# Patient Record
Sex: Male | Born: 1935 | Race: White | Hispanic: No | Marital: Married | Smoking: Former smoker
Health system: Southern US, Community
[De-identification: ages and names within clinical notes are randomized; demographics above are authoritative.]

## PROBLEM LIST (undated history)

## (undated) DIAGNOSIS — S82892A Other fracture of left lower leg, initial encounter for closed fracture: Secondary | ICD-10-CM

## (undated) DIAGNOSIS — Z87891 Personal history of nicotine dependence: Secondary | ICD-10-CM

## (undated) DIAGNOSIS — IMO0001 Reserved for inherently not codable concepts without codable children: Secondary | ICD-10-CM

## (undated) DIAGNOSIS — M1990 Unspecified osteoarthritis, unspecified site: Secondary | ICD-10-CM

## (undated) DIAGNOSIS — I480 Paroxysmal atrial fibrillation: Secondary | ICD-10-CM

## (undated) DIAGNOSIS — E785 Hyperlipidemia, unspecified: Secondary | ICD-10-CM

## (undated) DIAGNOSIS — I10 Essential (primary) hypertension: Secondary | ICD-10-CM

## (undated) DIAGNOSIS — E119 Type 2 diabetes mellitus without complications: Secondary | ICD-10-CM

## (undated) DIAGNOSIS — M316 Other giant cell arteritis: Secondary | ICD-10-CM

## (undated) DIAGNOSIS — E041 Nontoxic single thyroid nodule: Secondary | ICD-10-CM

## (undated) DIAGNOSIS — R300 Dysuria: Secondary | ICD-10-CM

## (undated) DIAGNOSIS — H547 Unspecified visual loss: Secondary | ICD-10-CM

## (undated) DIAGNOSIS — H349 Unspecified retinal vascular occlusion: Secondary | ICD-10-CM

## (undated) HISTORY — DX: Unspecified osteoarthritis, unspecified site: M19.90

## (undated) HISTORY — DX: Essential (primary) hypertension: I10

## (undated) HISTORY — DX: Other fracture of left lower leg, initial encounter for closed fracture: S82.892A

## (undated) HISTORY — DX: Type 2 diabetes mellitus without complications: E11.9

## (undated) HISTORY — DX: Unspecified retinal vascular occlusion: H34.9

## (undated) HISTORY — DX: Reserved for inherently not codable concepts without codable children: IMO0001

## (undated) HISTORY — DX: Paroxysmal atrial fibrillation: I48.0

## (undated) HISTORY — PX: EYE SURGERY: SHX253

## (undated) HISTORY — DX: Hyperlipidemia, unspecified: E78.5

## (undated) HISTORY — DX: Personal history of nicotine dependence: Z87.891

## (undated) HISTORY — DX: Nontoxic single thyroid nodule: E04.1

## (undated) HISTORY — PX: LASIK: SHX215

## (undated) HISTORY — DX: Dysuria: R30.0

## (undated) HISTORY — DX: Unspecified visual loss: H54.7

---

## 1994-10-05 HISTORY — PX: OTHER SURGICAL HISTORY: SHX169

## 2007-06-29 ENCOUNTER — Encounter: Payer: Self-pay | Admitting: Internal Medicine

## 2007-07-06 ENCOUNTER — Encounter: Payer: Self-pay | Admitting: Internal Medicine

## 2007-08-22 ENCOUNTER — Ambulatory Visit: Payer: Self-pay | Admitting: Internal Medicine

## 2007-09-12 ENCOUNTER — Encounter: Payer: Self-pay | Admitting: Internal Medicine

## 2007-10-06 ENCOUNTER — Encounter: Payer: Self-pay | Admitting: Internal Medicine

## 2008-09-03 ENCOUNTER — Ambulatory Visit: Payer: Self-pay | Admitting: Internal Medicine

## 2009-11-28 ENCOUNTER — Encounter: Payer: Self-pay | Admitting: Cardiovascular Disease

## 2010-08-15 ENCOUNTER — Encounter: Payer: Self-pay | Admitting: Cardiovascular Disease

## 2010-08-18 ENCOUNTER — Encounter: Payer: Self-pay | Admitting: Cardiovascular Disease

## 2010-08-21 ENCOUNTER — Encounter: Payer: Self-pay | Admitting: Cardiovascular Disease

## 2010-10-21 ENCOUNTER — Encounter: Payer: Self-pay | Admitting: Cardiology

## 2010-10-21 ENCOUNTER — Encounter: Payer: Self-pay | Admitting: Cardiovascular Disease

## 2010-11-04 ENCOUNTER — Encounter: Payer: Self-pay | Admitting: Cardiovascular Disease

## 2010-11-04 ENCOUNTER — Ambulatory Visit
Admission: RE | Admit: 2010-11-04 | Discharge: 2010-11-04 | Payer: Self-pay | Source: Home / Self Care | Attending: Cardiovascular Disease | Admitting: Cardiovascular Disease

## 2010-11-04 DIAGNOSIS — I4891 Unspecified atrial fibrillation: Secondary | ICD-10-CM | POA: Insufficient documentation

## 2010-11-04 DIAGNOSIS — E785 Hyperlipidemia, unspecified: Secondary | ICD-10-CM | POA: Insufficient documentation

## 2010-11-04 DIAGNOSIS — I10 Essential (primary) hypertension: Secondary | ICD-10-CM | POA: Insufficient documentation

## 2010-11-06 ENCOUNTER — Encounter: Payer: Self-pay | Admitting: Cardiovascular Disease

## 2010-11-12 ENCOUNTER — Encounter: Payer: Self-pay | Admitting: Cardiovascular Disease

## 2010-11-12 NOTE — Assessment & Plan Note (Signed)
Summary: NP6/SAB   Visit Type:  Initial Consult Primary Provider:  Dr. Darrick Huntsman  CC:  c/o SOB when walking up hills and occasional dizzy spells. denies chest pain.Marland Kitchen  History of Present Illness: 77 year old gentleman, patient of Dr. Darrick Huntsman, with a history of diabetes, hypertension, hyperlipidemia, Long smoking history who stopped 10 years ago, who presents by referral for evaluation of atrial fibrillation.  He reports having shortness of breath, poor energy and some dizzy episodes dating back to August of 2011. He was seen in [place] while visiting his daughter and notes indicate he was in atrial fibrillation at that time. He was started on aspirin. Echocardiogram suggested borderline normal ejection fraction of 50-55%, normal left atrial size, normal right ventricular systolic pressures, mild TR, mild MR otherwise essentially normal study.  He has felt relatively well apart from continued mild shortness of breath, increased fatigue. He denies any TIA type symptoms. No chest pain with exertion apart from shortness of breath. No significant lower extremity edema. The dizzy episodes are very brief and usually resolve once he sits down.  EKG shows atrial fibrillation with ventricular rate 87 beats per minute with no significant ST or T-wave changes noted, low voltage in the limb leads.  Notes indicate Holter monitor was performed for 24 hours August 21, 2010 joint average heart rate 71 beats per minute. Report suggests paroxysmal atrial fibrillation with primary rhythm being sinus rhythm with atrial flutter/fibrillation noted  Preventive Screening-Counseling & Management  Caffeine-Diet-Exercise     Does Patient Exercise: yes      Drug Use:  no.    Current Medications (verified): 1)  Flomax 0.4 Mg Caps (Tamsulosin Hcl) .Marland Kitchen.. 1 Tablet Once Daily 2)  Vitamin B-12 .Marland Kitchen.. 1 Tablet Daily 3)  Vitamin E .... 1 Tablet Daily 4)  Vitamin C .... 1 Tablet Daily 5)  Calcium .Marland Kitchen.. 1 Tablet Daily 6)  Fish Oil  .Marland Kitchen.. 1 Tablet Daily 7)  Saw Palmetto 500 Mg Caps (Saw Palmetto (Serenoa Repens)) .... Dose Unknown 8)  Novofine 30g X 8 Mm Misc (Insulin Pen Needle) .Marland Kitchen.. 1 Daily For 90 Days 9)  Lisinopril 10 Mg Tabs (Lisinopril) .Marland Kitchen.. 1 Tablet Two Times A Day 10)  Lipitor 20 Mg Tabs (Atorvastatin Calcium) .Marland Kitchen.. 1 Tablet Daily 11)  Terazosin Hcl 2 Mg Caps (Terazosin Hcl) .Marland Kitchen.. 1 Tablet Daily 12)  Metformin Hcl 1000 Mg Tabs (Metformin Hcl) .Marland Kitchen.. 1 Tablet Two Times A Day 13)  Hydrochlorothiazide 25 Mg Tabs (Hydrochlorothiazide) .Marland Kitchen.. 1 Tablet Daily 14)  Ecotrin 325 Mg Tbec (Aspirin) .Marland Kitchen.. 1 Tablet Once Daily  Allergies (verified): No Known Drug Allergies  Past History:  Past Surgical History: Last updated: 10/31/2010 Left ankle -1996,s/p surgery with screws  Family History: Last updated: 11/04/2010 Father: hypertension, CHF, cancer-deceased Mother: heart disease  Social History: Last updated: 11/04/2010 Retired  Married  Tobacco Use - Former-quit 4 years ago Alcohol Use - no Regular Exercise - yes-walks dog Drug Use - no  Risk Factors: Exercise: yes (11/04/2010)  Risk Factors: Smoking Status: quit (10/31/2010)  Past Medical History: Non-insulin dependent diabetes Mellitus Fracture of ankle-Left Atrial Fibrillation Hyperlipidemia  Family History: Father: hypertension, CHF, cancer-deceased Mother: heart disease  Social History: Retired  Married  Tobacco Use - Former-quit 4 years ago Alcohol Use - no Regular Exercise - yes-walks dog Drug Use - no Does Patient Exercise:  yes Drug Use:  no  Review of Systems       The patient complains of dyspnea on exertion.  The patient denies fever, weight  loss, weight gain, vision loss, decreased hearing, hoarseness, chest pain, syncope, peripheral edema, prolonged cough, abdominal pain, incontinence, muscle weakness, depression, and enlarged lymph nodes.         FATIGUE, sob  Vital Signs:  Patient profile:   77 year old male Height:      72  inches Weight:      156 pounds BMI:     21.23 Pulse rate:   87 / minute BP sitting:   145 / 76  (left arm) Cuff size:   regular  Vitals Entered By: Lysbeth Galas CMA (November 04, 2010 3:55 PM)   Physical Exam  General:  Well developed, well nourished, in no acute distress. Head:  normocephalic and atraumatic Neck:  Neck supple, no JVD. No masses, thyromegaly or abnormal cervical nodes. Lungs:  Clear bilaterally to auscultation and percussion. Heart:  Non-displaced PMI, chest non-tender; irregular rate and rhythm, S1, S2 without murmurs, rubs or gallops. Carotid upstroke normal, no bruit. Normal abdominal aortic size, no bruits. Femorals normal pulses, no bruits. Pedals normal pulses. No edema, no varicosities. Abdomen:  Bowel sounds positive; abdomen soft and non-tender without masses,  Msk:  Back normal, normal gait. Muscle strength and tone normal. Pulses:  pulses normal in all 4 extremities Extremities:  No clubbing or cyanosis. Neurologic:  Alert and oriented x 3. Skin:  Intact without lesions or rashes. Psych:  Normal affect.   Impression & Recommendations:  Problem # 1:  ATRIAL FIBRILLATION (ICD-427.31) appears to be in atrial fibrillation though uncertain if this is paroxysmal or chronic. We will start him on pradaxa and meet again with him in 4 weeks to discuss pharmacological cardioversion or DC cardioversion. His echocardiogram is essentially normal and my hope would be to maintain sinus rhythm.  For his rate control, I will start him on metoprolol per trach 25 mg b.i.d.. I suspect some of his dizzy episodes and may come from elevated heart rate. I've asked him to monitor his blood pressure and heart rate at home on the new medication.  The following medications were removed from the medication list:    Coumadin 5 Mg Tabs (Warfarin sodium) .Marland Kitchen... As directed    Ecotrin 325 Mg Tbec (Aspirin) .Marland Kitchen... 1 tablet once daily His updated medication list for this problem  includes:    Metoprolol Tartrate 25 Mg Tabs (Metoprolol tartrate) .Marland Kitchen... Take one tablet by mouth twice a day  Orders: EKG w/ Interpretation (93000)  Problem # 2:  HYPERLIPIDEMIA-MIXED (ICD-272.4) Cholesterol is well controlled on Lipitor. He does have a long smoking history.  His updated medication list for this problem includes:    Lipitor 20 Mg Tabs (Atorvastatin calcium) .Marland Kitchen... 1 tablet daily  Problem # 3:  HYPERTENSION, BENIGN (ICD-401.1) blood pressure is borderline elevated today. We will add metoprolol b.i.d. for rate control and blood pressure control.  The following medications were removed from the medication list:    Ecotrin 325 Mg Tbec (Aspirin) .Marland Kitchen... 1 tablet once daily His updated medication list for this problem includes:    Lisinopril 10 Mg Tabs (Lisinopril) .Marland Kitchen... 1 tablet two times a day    Terazosin Hcl 2 Mg Caps (Terazosin hcl) .Marland Kitchen... 1 tablet daily    Hydrochlorothiazide 25 Mg Tabs (Hydrochlorothiazide) .Marland Kitchen... 1 tablet daily    Metoprolol Tartrate 25 Mg Tabs (Metoprolol tartrate) .Marland Kitchen... Take one tablet by mouth twice a day  Patient Instructions: 1)  Your physician recommends that you schedule a follow-up appointment in: 4 weeks 2)  Your physician has recommended  you make the following change in your medication: STOP Aspirin. START Pradaxa 150mg  two times a day.  START Metoprolol Tartrate 25mg  two times a day. Prescriptions: PRADAXA 150 MG CAPS (DABIGATRAN ETEXILATE MESYLATE) Take one tablet two times a day  #60 x 6   Entered by:   Lanny Hurst RN   Authorized by:   Dossie Arbour MD   Signed by:   Lanny Hurst RN on 11/04/2010   Method used:   Electronically to        [facility]  [address] (retail)       [address]       [place], [place]  [address]       Ph: [phone]       Fax: [phone]   RxID:   [id number] METOPROLOL TARTRATE 25 MG TABS (METOPROLOL TARTRATE) Take one tablet by mouth twice a day  #60 x 6   Entered by:   Lanny Hurst  RN   Authorized by:   Dossie Arbour MD   Signed by:   Lanny Hurst RN on 11/04/2010   Method used:   Electronically to        [facility]  [address] (retail)       [address]       [place], [place]  [address]       Ph: [phone]       Fax: [phone]   RxID:   [id number]

## 2010-11-13 ENCOUNTER — Encounter: Payer: Self-pay | Admitting: Cardiovascular Disease

## 2010-11-20 NOTE — Medication Information (Signed)
Summary: Tax adviser   Imported By: Harlon Flor 11/14/2010 16:26:28  _____________________________________________________________________  External Attachment:    Type:   Image     Comment:   External Document

## 2010-12-02 NOTE — Letter (Signed)
Summary: [facility] 2010-2011  [facility] 2010-2011   Imported By: Marylou Mccoy 11/27/2010 13:26:28  _____________________________________________________________________  External Attachment:    Type:   Image     Comment:   External Document

## 2010-12-02 NOTE — Letter (Signed)
Summary: External Office Note (Unknown Facility & Doctor)  External Office Note (Unknown Facility & Doctor)   Imported By: Marylou Mccoy 11/27/2010 13:25:16  _____________________________________________________________________  External Attachment:    Type:   Image     Comment:   External Document

## 2010-12-02 NOTE — Consult Note (Signed)
Summary: [facility]: Consultation Report  [facility]: Consultation Report   Imported By: Earl Many 11/26/2010 10:19:36  _____________________________________________________________________  External Attachment:    Type:   Image     Comment:   External Document

## 2010-12-03 ENCOUNTER — Ambulatory Visit (INDEPENDENT_AMBULATORY_CARE_PROVIDER_SITE_OTHER): Payer: Medicare Other | Admitting: Cardiovascular Disease

## 2010-12-03 ENCOUNTER — Encounter: Payer: Self-pay | Admitting: Cardiovascular Disease

## 2010-12-03 DIAGNOSIS — I4891 Unspecified atrial fibrillation: Secondary | ICD-10-CM

## 2010-12-03 DIAGNOSIS — I10 Essential (primary) hypertension: Secondary | ICD-10-CM

## 2010-12-03 DIAGNOSIS — E785 Hyperlipidemia, unspecified: Secondary | ICD-10-CM

## 2010-12-04 ENCOUNTER — Encounter: Payer: Self-pay | Admitting: Cardiovascular Disease

## 2010-12-11 NOTE — Letter (Signed)
Summary: [facility] - Card Consult  [facility] - Card Consult   Imported By: Marylou Mccoy 11/27/2010 13:29:02  _____________________________________________________________________  External Attachment:    Type:   Image     Comment:   External Document

## 2010-12-11 NOTE — Medication Information (Signed)
Summary: Tax adviser   Imported By: Harlon Flor 12/03/2010 12:14:41  _____________________________________________________________________  External Attachment:    Type:   Image     Comment:   External Document

## 2010-12-11 NOTE — Letter (Signed)
Summary: At Home BP Readings  At Home BP Readings   Imported By: Harlon Flor 12/04/2010 16:19:53  _____________________________________________________________________  External Attachment:    Type:   Image     Comment:   External Document

## 2010-12-11 NOTE — Assessment & Plan Note (Signed)
Summary: F1M/AMD   Visit Type:  Follow-up Primary Provider:  Dr. Darrick Huntsman  CC:  "Doing well.".  History of Present Illness: 77 year old gentleman, patient of Dr. Darrick Huntsman, with a history of diabetes, hypertension, hyperlipidemia, Long smoking history who stopped 10 years ago, who was last seen in clinic several weeks ago for atrial fibrillation. He presents for routine followup.  since we have last seen him, he reports feeling very well. No problems with the metoprolol or anticoagulation. He is active, has no symptoms of palpitations, chest pain. His blood pressure was running very low on the metoprolol and his lisinopril and HCTZ was held. His blood pressure has continued to trend in the 120 range for systolic, 60-70 range her diastolic with heart rates in the 50s to 60s.  [place] outside Echocardiogram suggested borderline normal ejection fraction of 50-55%, normal left atrial size, normal right ventricular systolic pressures, mild TR, mild MR otherwise essentially normal study.  EKG shows normal sinus rhythm with rate 53 beats per minute with no significant ST or T wave changes  Notes indicate Holter monitor was performed for 24 hours August 21, 2010 joint average heart rate 71 beats per minute. Report suggests paroxysmal atrial fibrillation with primary rhythm being sinus rhythm with atrial flutter/fibrillation noted  Current Medications (verified): 1)  Flomax 0.4 Mg Caps (Tamsulosin Hcl) .Marland Kitchen.. 1 Tablet Once Daily 2)  Vitamin B-12 .Marland Kitchen.. 1 Tablet Daily 3)  Vitamin E .... 1 Tablet Daily 4)  Vitamin C .... 1 Tablet Daily 5)  Calcium .Marland Kitchen.. 1 Tablet Daily 6)  Fish Oil .Marland Kitchen.. 1 Tablet Daily 7)  Saw Palmetto 500 Mg Caps (Saw Palmetto (Serenoa Repens)) .... Dose Unknown 8)  Novofine 30g X 8 Mm Misc (Insulin Pen Needle) .Marland Kitchen.. 1 Daily For 90 Days 9)  Lipitor 20 Mg Tabs (Atorvastatin Calcium) .Marland Kitchen.. 1 Tablet Daily 10)  Terazosin Hcl 2 Mg Caps (Terazosin Hcl) .Marland Kitchen.. 1 Tablet Daily 11)  Metformin Hcl 1000 Mg Tabs  (Metformin Hcl) .Marland Kitchen.. 1 Tablet Two Times A Day 12)  Metoprolol Tartrate 25 Mg Tabs (Metoprolol Tartrate) .... Take One Tablet By Mouth Twice A Day 13)  Pradaxa 150 Mg Caps (Dabigatran Etexilate Mesylate) .... Take One Tablet Two Times A Day  Allergies (verified): No Known Drug Allergies  Past History:  Past Medical History: Last updated: 11/04/2010 Non-insulin dependent diabetes Mellitus Fracture of ankle-Left Atrial Fibrillation Hyperlipidemia  Past Surgical History: Last updated: 10/31/2010 Left ankle -1996,s/p surgery with screws  Family History: Last updated: 11/04/2010 Father: hypertension, CHF, cancer-deceased Mother: heart disease  Social History: Last updated: 11/04/2010 Retired  Married  Tobacco Use - Former-quit 4 years ago Alcohol Use - no Regular Exercise - yes-walks dog Drug Use - no  Risk Factors: Exercise: yes (11/04/2010)  Risk Factors: Smoking Status: quit (10/31/2010)  Review of Systems  The patient denies fever, weight loss, weight gain, vision loss, decreased hearing, hoarseness, chest pain, syncope, dyspnea on exertion, peripheral edema, prolonged cough, abdominal pain, incontinence, muscle weakness, depression, and enlarged lymph nodes.    Vital Signs:  Patient profile:   77 year old male Height:      72 inches Weight:      161 pounds BMI:     21.91 Pulse rate:   52 / minute BP sitting:   124 / 78  (left arm) Cuff size:   regular  Vitals Entered By: Bishop Dublin, CMA (December 03, 2010 3:44 PM)  Physical Exam  General:  Well developed, well nourished, in no acute distress. Head:  normocephalic and atraumatic Neck:  Neck supple, no JVD. No masses, thyromegaly or abnormal cervical nodes. Lungs:  Clear bilaterally to auscultation and percussion. Heart:  Non-displaced PMI, chest non-tender; regular rate and rhythm, S1, S2 without murmurs, rubs or gallops. Carotid upstroke normal, no bruit.  Pedals normal pulses. No edema, no  varicosities. Abdomen:  Bowel sounds positive; abdomen soft and non-tender without masses,  Msk:  Back normal, normal gait. Muscle strength and tone normal. Pulses:  pulses normal in all 4 extremities Extremities:  No clubbing or cyanosis. Neurologic:  Alert and oriented x 3. Skin:  Intact without lesions or rashes. Psych:  Normal affect.   Impression & Recommendations:  Problem # 1:  ATRIAL FIBRILLATION (ICD-427.31) he has converted to normal sinus rhythm. Timing of his conversion is uncertain. We have suggested he stay on his anticoagulation for several more weeks and we have given him some samples. After that, he can stop his pradaxa. We will repeat an EKG in 2 weeks' time to confirm he is maintaining sinus rhythm. We have suggested he stay on his metoprolol for now. Once he does stop his anticoagulation, he could start aspirin.  His updated medication list for this problem includes:    Metoprolol Tartrate 25 Mg Tabs (Metoprolol tartrate) .Marland Kitchen... Take one tablet by mouth twice a day  Problem # 2:  HYPERTENSION, BENIGN (ICD-401.1) Blood pressure is well controlled without lisinopril or HCTZ. We have suggested he continue to monitor his pressure and if his blood pressure does trend upwards, we could start a low-dose lisinopril.  The following medications were removed from the medication list:    Lisinopril 10 Mg Tabs (Lisinopril) .Marland Kitchen... 1 tablet two times a day    Hydrochlorothiazide 25 Mg Tabs (Hydrochlorothiazide) .Marland Kitchen... 1 tablet daily His updated medication list for this problem includes:    Terazosin Hcl 2 Mg Caps (Terazosin hcl) .Marland Kitchen... 1 tablet daily    Metoprolol Tartrate 25 Mg Tabs (Metoprolol tartrate) .Marland Kitchen... Take one tablet by mouth twice a day  Problem # 3:  HYPERLIPIDEMIA-MIXED (ICD-272.4) Continue Lipitor. No changes made.  His updated medication list for this problem includes:    Lipitor 20 Mg Tabs (Atorvastatin calcium) .Marland Kitchen... 1 tablet daily  Patient Instructions: 1)   Your physician recommends that you schedule a follow-up appointment in: 3 months, and return for EKG in 2 weeks. 2)  Your physician has recommended you make the following change in your medication: STOP Lisinopril. STOP Hydrochlorothiazide. CONTINUE Metoprolol. TAKE Pradaxa 150mg  two times a day for 3 more weeks until samples run out then STOP PRADAXA.

## 2010-12-17 ENCOUNTER — Ambulatory Visit (INDEPENDENT_AMBULATORY_CARE_PROVIDER_SITE_OTHER): Payer: Medicare Other

## 2010-12-17 ENCOUNTER — Encounter: Payer: Self-pay | Admitting: Cardiovascular Disease

## 2010-12-17 DIAGNOSIS — I4891 Unspecified atrial fibrillation: Secondary | ICD-10-CM

## 2010-12-23 NOTE — Assessment & Plan Note (Signed)
Summary: EKG/AMD   Visit Type:  Nurse visit  CC:  EKG .  Current Medications (verified): 1)  Flomax 0.4 Mg Caps (Tamsulosin Hcl) .Marland Kitchen.. 1 Tablet Once Daily 2)  Vitamin B-12 .Marland Kitchen.. 1 Tablet Daily 3)  Vitamin E .... 1 Tablet Daily 4)  Vitamin C .... 1 Tablet Daily 5)  Calcium .Marland Kitchen.. 1 Tablet Daily 6)  Fish Oil .Marland Kitchen.. 1 Tablet Daily 7)  Saw Palmetto 500 Mg Caps (Saw Palmetto (Serenoa Repens)) .... Dose Unknown 8)  Novofine 30g X 8 Mm Misc (Insulin Pen Needle) .Marland Kitchen.. 1 Daily For 90 Days 9)  Lipitor 20 Mg Tabs (Atorvastatin Calcium) .Marland Kitchen.. 1 Tablet Daily 10)  Terazosin Hcl 2 Mg Caps (Terazosin Hcl) .Marland Kitchen.. 1 Tablet Daily 11)  Metformin Hcl 1000 Mg Tabs (Metformin Hcl) .Marland Kitchen.. 1 Tablet Two Times A Day 12)  Metoprolol Tartrate 25 Mg Tabs (Metoprolol Tartrate) .... Take One Tablet By Mouth Twice A Day 13)  Pradaxa 150 Mg Caps (Dabigatran Etexilate Mesylate) .... Take One Tablet Two Times A Day  Allergies (verified): No Known Drug Allergies  Past History:  Past Medical History: Last updated: 11/04/2010 Non-insulin dependent diabetes Mellitus Fracture of ankle-Left Atrial Fibrillation Hyperlipidemia  Past Surgical History: Last updated: 10/31/2010 Left ankle -1996,s/p surgery with screws  Family History: Last updated: 11/04/2010 Father: hypertension, CHF, cancer-deceased Mother: heart disease  Social History: Last updated: 11/04/2010 Retired  Married  Tobacco Use - Former-quit 4 years ago Alcohol Use - no Regular Exercise - yes-walks dog Drug Use - no  Risk Factors: Exercise: yes (11/04/2010)  Risk Factors: Smoking Status: quit (10/31/2010)  Vital Signs:  Patient profile:   77 year old male Height:      72 inches Weight:      160 pounds BMI:     21.78 Pulse rate:   51 / minute BP sitting:   130 / 80  (left arm) Cuff size:   regular  Vitals Entered By: Bishop Dublin, CMA (December 17, 2010 1:58 PM) CC: EKG  Comments Pt in for 2 week EKG follow up. EKG today shows SR with  bradycardia HR 51, pt is asymptomatic. Pt is currently on Pradaxa, when he runs out, he will discontinue medication per last office note. Pt will start ASA 81mg  once daily after he d/c's Pradaxa. Pt is still taking Metoprolol Tartrate 25mg  two times a day. Pt has f/u with Dr. Mariah Milling 02/2011, he will contact office with any problems in the interim.  Appended Document: EKG/AMD He can stop pradaxa and start ASA. Contact our office if he has palpiations concerning for atrial fib. Routine follow up in 6 months  Appended Document: EKG/AMD notified patient can stop pradaxa and start on aspirin.  Told patient to contact our office if he has palpitations or concern if in A-Fib.  He has a F/U in May 2012.

## 2011-02-13 ENCOUNTER — Ambulatory Visit (INDEPENDENT_AMBULATORY_CARE_PROVIDER_SITE_OTHER): Payer: Medicare Other | Admitting: Cardiovascular Disease

## 2011-02-13 ENCOUNTER — Encounter: Payer: Self-pay | Admitting: Cardiovascular Disease

## 2011-02-13 DIAGNOSIS — I10 Essential (primary) hypertension: Secondary | ICD-10-CM

## 2011-02-13 DIAGNOSIS — I4891 Unspecified atrial fibrillation: Secondary | ICD-10-CM

## 2011-02-13 DIAGNOSIS — E785 Hyperlipidemia, unspecified: Secondary | ICD-10-CM

## 2011-02-13 MED ORDER — DABIGATRAN ETEXILATE MESYLATE 150 MG PO CAPS: 150.0000 mg | ORAL_CAPSULE | Freq: Two times a day (BID) | ORAL | Status: DC

## 2011-02-13 NOTE — Patient Instructions (Signed)
You are doing well. Please start prada 150 mg in the Am and PM. This is for atrial fibrillation. Please call us if you have new issues that need to be addressed before your next appt.  Please call when you get back from your trip and we will schedule a follow up visit.

## 2011-02-13 NOTE — Assessment & Plan Note (Signed)
Continue Lipitor at its current dose for now.

## 2011-02-13 NOTE — Progress Notes (Signed)
   Patient ID: Chad Bailey, male    DOB: 04/20/1930, 77 y.o.   MRN: [id number]  HPI Comments: 77 year old gentleman, patient of Dr. Darrick Huntsman, with a history of diabetes, hypertension, hyperlipidemia, Long smoking history who stopped 10 years ago, Presenting initially in January with atrial fibrillation, started on anticoagulation with next EKG at the end of February showing sinus rhythm, repeat EKG in mid March showing sinus rhythm presenting today for routine followup.  He reports that he feels well. He is planning on going out of town for several weeks. His sister recently passed away in [place]. He denies any significant shortness of breath, malaise, or edema, chest pain. He is off anticoagulation.  Old outside Echocardiogram suggested borderline normal ejection fraction of 50-55%, normal left atrial size, normal right ventricular systolic pressures, mild TR, mild MR otherwise essentially normal study.   EKG shows Atrial fibrillation with ventricular rate 69 beats per minute, no significant ST or T wave changes   Notes indicate Holter monitor was performed for 24 hours August 21, 2010 joint average heart rate 71 beats per minute. Report suggests paroxysmal atrial fibrillation with primary rhythm being sinus rhythm with atrial flutter/fibrillation noted      Review of Systems  Constitutional: Negative.   HENT: Negative.   Eyes: Negative.   Respiratory: Negative.   Cardiovascular: Negative.   Gastrointestinal: Negative.   Musculoskeletal: Negative.   Skin: Negative.   Neurological: Negative.   Hematological: Negative.   Psychiatric/Behavioral: Negative.   All other systems reviewed and are negative.   BP 120/72  Pulse 69  Ht 6' (1.829 m)  Wt 156 lb (70.761 kg)  BMI 21.16 kg/m2   Physical Exam  Nursing note and vitals reviewed. Constitutional: He is oriented to person, place, and time. He appears well-developed and well-nourished.  HENT:  Head: Normocephalic.  Nose: Nose  normal.  Mouth/Throat: Oropharynx is clear and moist.  Eyes: Conjunctivae are normal. Pupils are equal, round, and reactive to light.  Neck: Normal range of motion. Neck supple. No JVD present.  Cardiovascular: Normal rate, S1 normal, S2 normal, normal heart sounds and intact distal pulses.  An irregularly irregular rhythm present. Exam reveals no gallop and no friction rub.   No murmur heard. Pulmonary/Chest: Effort normal and breath sounds normal. No respiratory distress. He has no wheezes. He has no rales. He exhibits no tenderness.  Abdominal: Soft. Bowel sounds are normal. He exhibits no distension. There is no tenderness.  Musculoskeletal: Normal range of motion. He exhibits no edema and no tenderness.  Lymphadenopathy:    He has no cervical adenopathy.  Neurological: He is alert and oriented to person, place, and time. Coordination normal.  Skin: Skin is warm and dry. No rash noted. No erythema.  Psychiatric: He has a normal mood and affect. His behavior is normal. Judgment and thought content normal.           Assessment and Plan

## 2011-02-13 NOTE — Assessment & Plan Note (Signed)
He has converted back to atrial fibrillation today. Rate is well controlled. He is asymptomatic. We will start him back on pradaxa 150 mg b.i.d. And have suggested that we repeat his EKG when he gets back from his trip in 6 weeks or so.

## 2011-02-13 NOTE — Assessment & Plan Note (Signed)
Blood pressure is well controlled on today's visit. No changes made to the medications. 

## 2011-03-03 ENCOUNTER — Ambulatory Visit: Payer: PRIVATE HEALTH INSURANCE | Admitting: Cardiovascular Disease

## 2011-04-17 ENCOUNTER — Encounter: Payer: Self-pay | Admitting: Cardiovascular Disease

## 2011-04-23 ENCOUNTER — Ambulatory Visit: Payer: PRIVATE HEALTH INSURANCE | Admitting: Cardiovascular Disease

## 2011-04-24 ENCOUNTER — Encounter: Payer: Self-pay | Admitting: *Deleted

## 2011-04-24 ENCOUNTER — Ambulatory Visit (INDEPENDENT_AMBULATORY_CARE_PROVIDER_SITE_OTHER): Payer: Medicare Other | Admitting: Cardiovascular Disease

## 2011-04-24 DIAGNOSIS — E785 Hyperlipidemia, unspecified: Secondary | ICD-10-CM

## 2011-04-24 DIAGNOSIS — I10 Essential (primary) hypertension: Secondary | ICD-10-CM

## 2011-04-24 DIAGNOSIS — I4891 Unspecified atrial fibrillation: Secondary | ICD-10-CM

## 2011-04-24 NOTE — Assessment & Plan Note (Signed)
He was previously on Lipitor 20 mg daily. We've asked him to talk with Dr. Darrick Huntsman to see if this can be restarted.

## 2011-04-24 NOTE — Assessment & Plan Note (Signed)
Blood pressure is well controlled on today's visit. No changes made to the medications. 

## 2011-04-24 NOTE — Patient Instructions (Signed)
You are doing well. No medication changes were made. Please monitor your heart rate. If you have lightheadedness, or the if heart rates run in the 40s, please call the office.  Please call us if you have new issues that need to be addressed before your next appt.  We will call you for a follow up Appt. In 6 months

## 2011-04-24 NOTE — Progress Notes (Signed)
Patient ID: Chad Bailey, male    DOB: Feb 07, 1930, 77 y.o.   MRN: [id number]  HPI Comments: 77 year old gentleman, patient of Dr. Darrick Huntsman, with a history of diabetes, hypertension, hyperlipidemia, Long smoking history who stopped 10 years ago, Presenting initially in January 2012 with atrial fibrillation, started on anticoagulation who has maintained normal sinus rhythm since that time.  He reports that he feels well.  He denies any significant shortness of breath, malaise, or edema, chest pain. He provides detailed blood pressure measurements over the past several months and systolic pressures typically in the 110-125 range. His weight has been slowly decreasing. He denies any lightheadedness or dizziness. His heart rate is periodically low in the 40s on his measurements though usually in the 50s to 70s.  Old outside Echocardiogram suggested borderline normal ejection fraction of 50-55%, normal left atrial size, normal right ventricular systolic pressures, mild TR, mild MR otherwise essentially normal study.   Notes indicate Holter monitor was performed for 24 hours August 21, 2010 joint average heart rate 71 beats per minute. Report suggests paroxysmal atrial fibrillation with primary rhythm being sinus rhythm with atrial flutter/fibrillation noted  EKG shows normal sinus rhythm with rate 55 beats per minute with no significant ST or T wave changes   Outpatient Encounter Prescriptions as of 04/24/2011  Medication Sig Dispense Refill  . aspirin 81 MG tablet Take 81 mg by mouth daily.        . dabigatran (PRADAXA) 150 MG CAPS Take 1 capsule (150 mg total) by mouth every 12 (twelve) hours.  60 capsule  6  . insulin aspart protamine-insulin aspart (NOVOLOG 70/30) (70-30) 100 UNIT/ML injection Inject 18 Units into the skin daily with breakfast.        . Insulin Pen Needle (NOVOFINE) 30G X 8 MM MISC Inject 1 packet into the skin as needed.        . metFORMIN (GLUMETZA) 1000 MG (MOD) 24 hr tablet Take  1,000 mg by mouth 2 (two) times daily with a meal.        . metoprolol tartrate (LOPRESSOR) 25 MG tablet Take 25 mg by mouth 2 (two) times daily.        . Multiple Vitamin (MULTIVITAMIN) tablet Take 1 tablet by mouth daily.        . Saw Palmetto 450 MG CAPS Take 1 capsule by mouth 2 (two) times daily.        . Tamsulosin HCl (FLOMAX) 0.4 MG CAPS Take 0.4 mg by mouth daily.        Marland Kitchen terazosin (HYTRIN) 2 MG capsule Take 2 mg by mouth at bedtime.           Review of Systems  Constitutional: Negative.   HENT: Negative.   Eyes: Negative.   Respiratory: Negative.   Cardiovascular: Negative.   Gastrointestinal: Negative.   Musculoskeletal: Negative.   Skin: Negative.   Neurological: Negative.   Hematological: Negative.   Psychiatric/Behavioral: Negative.   All other systems reviewed and are negative.   BP 151/71  Pulse 52  Ht 6' (1.829 m)  Wt 153 lb (69.4 kg)  BMI 20.75 kg/m2   Physical Exam  Nursing note and vitals reviewed. Constitutional: He is oriented to person, place, and time. He appears well-developed and well-nourished.  HENT:  Head: Normocephalic.  Nose: Nose normal.  Mouth/Throat: Oropharynx is clear and moist.  Eyes: Conjunctivae are normal. Pupils are equal, round, and reactive to light.  Neck: Normal range of motion. Neck supple. No JVD present.  Cardiovascular: Normal rate, regular rhythm, S1 normal, S2 normal, normal heart sounds and intact distal pulses.  Exam reveals no gallop and no friction rub.   No murmur heard. Pulmonary/Chest: Effort normal and breath sounds normal. No respiratory distress. He has no wheezes. He has no rales. He exhibits no tenderness.  Abdominal: Soft. Bowel sounds are normal. He exhibits no distension. There is no tenderness.  Musculoskeletal: Normal range of motion. He exhibits no edema and no tenderness.  Lymphadenopathy:    He has no cervical adenopathy.  Neurological: He is alert and oriented to person, place, and time.  Coordination normal.  Skin: Skin is warm and dry. No rash noted. No erythema.  Psychiatric: He has a normal mood and affect. His behavior is normal. Judgment and thought content normal.           Assessment and Plan

## 2011-04-24 NOTE — Assessment & Plan Note (Signed)
He is maintaining sinus rhythm. We did talk to him about his bradycardia. he has heart rates in the 40s periodically on his measurements. We have asked him to closely monitor his heart rate and contact our office if he has lightheadedness or monitors heart rates consistently in the low 50s to 40s. We would decrease his metoprolol dose in 1/2.

## 2011-07-13 ENCOUNTER — Ambulatory Visit (INDEPENDENT_AMBULATORY_CARE_PROVIDER_SITE_OTHER): Payer: Medicare Other | Admitting: Internal Medicine

## 2011-07-13 DIAGNOSIS — Z23 Encounter for immunization: Secondary | ICD-10-CM

## 2011-10-07 ENCOUNTER — Other Ambulatory Visit: Payer: Self-pay | Admitting: Internal Medicine

## 2011-10-22 ENCOUNTER — Encounter: Payer: Self-pay | Admitting: Cardiovascular Disease

## 2011-10-22 ENCOUNTER — Ambulatory Visit (INDEPENDENT_AMBULATORY_CARE_PROVIDER_SITE_OTHER): Payer: Medicare Other | Admitting: Cardiovascular Disease

## 2011-10-22 VITALS — BP 118/64 | HR 84 | Ht 72.0 in | Wt 162.0 lb

## 2011-10-22 DIAGNOSIS — I10 Essential (primary) hypertension: Secondary | ICD-10-CM

## 2011-10-22 DIAGNOSIS — E785 Hyperlipidemia, unspecified: Secondary | ICD-10-CM

## 2011-10-22 DIAGNOSIS — I4891 Unspecified atrial fibrillation: Secondary | ICD-10-CM

## 2011-10-22 NOTE — Assessment & Plan Note (Signed)
He is currently not on a cholesterol medication per the list. Previously on Lipitor.

## 2011-10-22 NOTE — Progress Notes (Signed)
Patient ID: Chad Bailey, male    DOB: 1929-11-29, 78 y.o.   MRN: [id number]  HPI Comments: 78 year old gentleman, patient of Dr. Darrick Huntsman, with a history of diabetes, hypertension, hyperlipidemia, Long smoking history who stopped 10 years ago, Presenting initially in January 2012 with atrial fibrillation, started on anticoagulation who Presents for routine followup.  He reports that he feels well.  He denies any significant shortness of breath, malaise, or edema, chest pain. He denies any lightheadedness or dizziness.  He has been monitoring his blood pressure and heart rate with no noticeable changes over the past several months.   Old outside Echocardiogram suggested borderline normal ejection fraction of 50-55%, normal left atrial size, normal right ventricular systolic pressures, mild TR, mild MR otherwise essentially normal study.   Notes indicate Holter monitor was performed for 24 hours August 21, 2010 joint average heart rate 71 beats per minute. Report suggests paroxysmal atrial fibrillation with primary rhythm being sinus rhythm with atrial flutter/fibrillation noted  EKG today shows atrial fibrillation with ventricular rate 87 beats per minute with no significant ST or T wave changes   Outpatient Encounter Prescriptions as of 04/24/2011  Medication Sig Dispense Refill  . aspirin 81 MG tablet Take 81 mg by mouth daily.        . dabigatran (PRADAXA) 150 MG CAPS Take 1 capsule (150 mg total) by mouth every 12 (twelve) hours.  60 capsule  6  . insulin aspart protamine-insulin aspart (NOVOLOG 70/30) (70-30) 100 UNIT/ML injection Inject 18 Units into the skin daily with breakfast.        . Insulin Pen Needle (NOVOFINE) 30G X 8 MM MISC Inject 1 packet into the skin as needed.        . metFORMIN (GLUMETZA) 1000 MG (MOD) 24 hr tablet Take 1,000 mg by mouth 2 (two) times daily with a meal.        . metoprolol tartrate (LOPRESSOR) 25 MG tablet Take 25 mg by mouth 2 (two) times daily.        .  Multiple Vitamin (MULTIVITAMIN) tablet Take 1 tablet by mouth daily.        . Saw Palmetto 450 MG CAPS Take 1 capsule by mouth 2 (two) times daily.        . Tamsulosin HCl (FLOMAX) 0.4 MG CAPS Take 0.4 mg by mouth daily.        Marland Kitchen terazosin (HYTRIN) 2 MG capsule Take 2 mg by mouth at bedtime.           Review of Systems  Constitutional: Negative.   HENT: Negative.   Eyes: Negative.   Respiratory: Negative.   Cardiovascular: Negative.   Gastrointestinal: Negative.   Musculoskeletal: Negative.   Skin: Negative.   Neurological: Negative.   Hematological: Negative.   Psychiatric/Behavioral: Negative.   All other systems reviewed and are negative.   BP 118/64  Pulse 84  Ht 6' (1.829 m)  Wt 162 lb (73.483 kg)  BMI 21.97 kg/m2   Physical Exam  Nursing note and vitals reviewed. Constitutional: He is oriented to person, place, and time. He appears well-developed and well-nourished.  HENT:  Head: Normocephalic.  Nose: Nose normal.  Mouth/Throat: Oropharynx is clear and moist.  Eyes: Conjunctivae are normal. Pupils are equal, round, and reactive to light.  Neck: Normal range of motion. Neck supple. No JVD present.  Cardiovascular: Normal rate, S1 normal, S2 normal and intact distal pulses.  An irregularly irregular rhythm present. Exam reveals no gallop and no friction rub.  Murmur heard.  Crescendo systolic murmur is present with a grade of 2/6  Pulmonary/Chest: Effort normal and breath sounds normal. No respiratory distress. He has no wheezes. He has no rales. He exhibits no tenderness.  Abdominal: Soft. Bowel sounds are normal. He exhibits no distension. There is no tenderness.  Musculoskeletal: Normal range of motion. He exhibits no edema and no tenderness.  Lymphadenopathy:    He has no cervical adenopathy.  Neurological: He is alert and oriented to person, place, and time. Coordination normal.  Skin: Skin is warm and dry. No rash noted. No erythema.  Psychiatric: He has a  normal mood and affect. His behavior is normal. Judgment and thought content normal.           Assessment and Plan

## 2011-10-22 NOTE — Assessment & Plan Note (Signed)
Blood pressure is well controlled on today's visit. No changes made to the medications. 

## 2011-10-22 NOTE — Assessment & Plan Note (Signed)
He is in atrial fibrillation on today's visit. Uncertain how long he is been in this rhythm as he does not have any symptoms. He is on anticoagulation already. He is not particularly interested in cardioversion at this time. Rate is recently well controlled on metoprolol 25 mg b.i.d.. We'll continue him on his current regiment with no changes. We have asked him to contact our office if he has any symptoms of edema or shortness of breath with exertion. There is a chance that he could convert back to normal sinus rhythm. He does report chopping wood most of the day yesterday and I wonder if this could have caused his arrhythmia.

## 2011-10-22 NOTE — Patient Instructions (Signed)
You are doing well. No medication changes were made.  Please call us if you have new issues that need to be addressed before your next appt.  Your physician wants you to follow-up in: 6 months.  You will receive a reminder letter in the mail two months in advance. If you don't receive a letter, please call our office to schedule the follow-up appointment.   

## 2011-10-26 ENCOUNTER — Other Ambulatory Visit: Payer: Self-pay | Admitting: Internal Medicine

## 2011-11-16 ENCOUNTER — Other Ambulatory Visit: Payer: Self-pay | Admitting: Internal Medicine

## 2011-12-04 ENCOUNTER — Other Ambulatory Visit: Payer: Self-pay | Admitting: Cardiovascular Disease

## 2011-12-04 ENCOUNTER — Other Ambulatory Visit: Payer: Self-pay

## 2011-12-04 MED ORDER — DABIGATRAN ETEXILATE MESYLATE 150 MG PO CAPS: 150.0000 mg | ORAL_CAPSULE | Freq: Two times a day (BID) | ORAL | Status: DC

## 2011-12-09 ENCOUNTER — Other Ambulatory Visit: Payer: Self-pay

## 2011-12-09 MED ORDER — DABIGATRAN ETEXILATE MESYLATE 150 MG PO CAPS: 150.0000 mg | ORAL_CAPSULE | Freq: Two times a day (BID) | ORAL | Status: DC

## 2012-01-14 ENCOUNTER — Other Ambulatory Visit: Payer: Self-pay | Admitting: Internal Medicine

## 2012-01-21 ENCOUNTER — Other Ambulatory Visit: Payer: Self-pay | Admitting: Internal Medicine

## 2012-02-05 ENCOUNTER — Telehealth: Payer: Self-pay | Admitting: Internal Medicine

## 2012-02-05 NOTE — Telephone Encounter (Signed)
Patient is wanting to know is it time for him to have labs done. Please give patient a call .

## 2012-02-08 NOTE — Telephone Encounter (Signed)
Patients record from [facility] states he has not been seen nor has he had labs since 01/2011.  I advised that he needed to make an appt.  He stated he is going out of town in the next few weeks but will call back to schedule his appt with Dr. Darrick Huntsman.

## 2012-02-18 LAB — HM DIABETES EYE EXAM: HM Diabetic Eye Exam: NORMAL

## 2012-02-19 ENCOUNTER — Ambulatory Visit (INDEPENDENT_AMBULATORY_CARE_PROVIDER_SITE_OTHER): Payer: Medicare Other | Admitting: Cardiovascular Disease

## 2012-02-19 ENCOUNTER — Encounter: Payer: Self-pay | Admitting: Cardiovascular Disease

## 2012-02-19 VITALS — BP 130/64 | HR 59 | Resp 18 | Ht 70.0 in | Wt 155.8 lb

## 2012-02-19 DIAGNOSIS — E785 Hyperlipidemia, unspecified: Secondary | ICD-10-CM

## 2012-02-19 DIAGNOSIS — I4891 Unspecified atrial fibrillation: Secondary | ICD-10-CM

## 2012-02-19 DIAGNOSIS — I10 Essential (primary) hypertension: Secondary | ICD-10-CM

## 2012-02-19 NOTE — Assessment & Plan Note (Signed)
He is in normal sinus rhythm on his current medications. We would continue anticoagulation as he does continue to have periodic episodes of atrial fibrillation (as seen on his last clinic visit). No further medication changes were made. Is otherwise happy with no complaints.

## 2012-02-19 NOTE — Progress Notes (Signed)
Patient ID: Chad Bailey, male    DOB: September 25, 1930, 78 y.o.   MRN: [id number]  HPI Comments: 78 year old gentleman, patient of Dr. Darrick Huntsman, with a history of diabetes, hypertension, hyperlipidemia, Long smoking history who stopped 10 years ago, Presenting initially in January 2012 with atrial fibrillation, started on anticoagulation who presents for routine followup.  On his last clinic visit, he was in atrial fibrillation. Rate was well-controlled. He had been chopping wood the day before.  No new medications were started. Since then he has felt well. His wife reports that sometimes he is tired and had a nap. Occasionally he has a dizzy episode if he stands up too quickly.. Blood pressure and heart rate are relatively well controlled.  Old outside Echocardiogram suggested borderline normal ejection fraction of 50-55%, normal left atrial size, normal right ventricular systolic pressures, mild TR, mild MR otherwise essentially normal study.   Notes indicate Holter monitor was performed for 24 hours August 21, 2010 joint average heart rate 71 beats per minute. Report suggests paroxysmal atrial fibrillation with primary rhythm being sinus rhythm with atrial flutter/fibrillation noted  EKG today shows normal sinus rhythm with rate 59 beats per minute, rare APC   Outpatient Encounter Prescriptions as of 02/19/2012  Medication Sig Dispense Refill  . aspirin 81 MG tablet Take 81 mg by mouth daily.        . dabigatran (PRADAXA) 150 MG CAPS Take 1 capsule (150 mg total) by mouth every 12 (twelve) hours.  60 capsule  4  . Insulin Pen Needle (NOVOFINE) 30G X 8 MM MISC Inject 1 packet into the skin as needed.        . metFORMIN (GLUCOPHAGE) 1000 MG tablet TAKE ONE TABLET BY MOUTH TWICE DAILY  180 tablet  2  . metoprolol tartrate (LOPRESSOR) 25 MG tablet TAKE ONE TABLET BY MOUTH TWICE DAILY  180 tablet  1  . Multiple Vitamin (MULTIVITAMIN) tablet Take 1 tablet by mouth daily.        Marland Kitchen NOVOLOG MIX 70/30  FLEXPEN (70-30) 100 UNIT/ML injection INJECT 18 UNITS SUBCUTANEOUSLY 20 MINUTES PRIOR TO DINNER  15 mL  2  . Tamsulosin HCl (FLOMAX) 0.4 MG CAPS TAKE ONE CAPSULE BY MOUTH EVERY DAY  90 capsule  3  . terazosin (HYTRIN) 2 MG capsule TAKE ONE CAPSULE BY MOUTH EVERY DAY  90 capsule  3  . metFORMIN (GLUMETZA) 1000 MG (MOD) 24 hr tablet Take 1,000 mg by mouth 2 (two) times daily with a meal.        . Saw Palmetto 450 MG CAPS Take 1 capsule by mouth 2 (two) times daily.          Review of Systems  Constitutional: Negative.   HENT: Negative.   Eyes: Negative.   Respiratory: Negative.   Cardiovascular: Negative.   Gastrointestinal: Negative.   Musculoskeletal: Negative.   Skin: Negative.   Neurological: Negative.   Hematological: Negative.   Psychiatric/Behavioral: Negative.   All other systems reviewed and are negative.   BP 130/64  Pulse 59  Resp 18  Ht 5\' 10"  (1.778 m)  Wt 155 lb 12.8 oz (70.67 kg)  BMI 22.35 kg/m2  SpO2 97%   Physical Exam  Nursing note and vitals reviewed. Constitutional: He is oriented to person, place, and time. He appears well-developed and well-nourished.  HENT:  Head: Normocephalic.  Nose: Nose normal.  Mouth/Throat: Oropharynx is clear and moist.  Eyes: Conjunctivae are normal. Pupils are equal, round, and reactive to light.  Neck: Normal  range of motion. Neck supple. No JVD present.  Cardiovascular: Normal rate, S1 normal, S2 normal and intact distal pulses.  An irregularly irregular rhythm present. Exam reveals no gallop and no friction rub.   Murmur heard.  Crescendo systolic murmur is present with a grade of 2/6  Pulmonary/Chest: Effort normal and breath sounds normal. No respiratory distress. He has no wheezes. He has no rales. He exhibits no tenderness.  Abdominal: Soft. Bowel sounds are normal. He exhibits no distension. There is no tenderness.  Musculoskeletal: Normal range of motion. He exhibits no edema and no tenderness.  Lymphadenopathy:     He has no cervical adenopathy.  Neurological: He is alert and oriented to person, place, and time. Coordination normal.  Skin: Skin is warm and dry. No rash noted. No erythema.  Psychiatric: He has a normal mood and affect. His behavior is normal. Judgment and thought content normal.           Assessment and Plan

## 2012-02-19 NOTE — Patient Instructions (Signed)
You are doing well. No medication changes were made.  Ask Dr. Darrick Huntsman about restarting low dose cholesterol pill  Please call us if you have new issues that need to be addressed before your next appt.  Your physician wants you to follow-up in: 6 months.  You will receive a reminder letter in the mail two months in advance. If you don't receive a letter, please call our office to schedule the follow-up appointment.

## 2012-02-19 NOTE — Assessment & Plan Note (Signed)
Blood pressure is well controlled on today's visit. No changes made to the medications. 

## 2012-02-19 NOTE — Assessment & Plan Note (Signed)
Lipitor 20 mg was stopped in 2012. He has followup with Dr. Darrick Huntsman next month. They have suggested he discuss restarting low-dose Lipitor with her. Details are not available though I suspect there was lab work abnormality noted. Perhaps an alternate statin can be used.

## 2012-04-13 ENCOUNTER — Ambulatory Visit (INDEPENDENT_AMBULATORY_CARE_PROVIDER_SITE_OTHER): Payer: Medicare Other | Admitting: Internal Medicine

## 2012-04-13 ENCOUNTER — Encounter: Payer: Self-pay | Admitting: Internal Medicine

## 2012-04-13 VITALS — BP 138/80 | HR 56 | Temp 98.1°F | Resp 16 | Wt 155.0 lb

## 2012-04-13 DIAGNOSIS — E119 Type 2 diabetes mellitus without complications: Secondary | ICD-10-CM

## 2012-04-13 DIAGNOSIS — I10 Essential (primary) hypertension: Secondary | ICD-10-CM

## 2012-04-13 DIAGNOSIS — E785 Hyperlipidemia, unspecified: Secondary | ICD-10-CM

## 2012-04-13 DIAGNOSIS — Z7901 Long term (current) use of anticoagulants: Secondary | ICD-10-CM

## 2012-04-13 DIAGNOSIS — M1990 Unspecified osteoarthritis, unspecified site: Secondary | ICD-10-CM

## 2012-04-13 DIAGNOSIS — M129 Arthropathy, unspecified: Secondary | ICD-10-CM

## 2012-04-13 MED ORDER — GLUCOSE BLOOD VI STRP: ORAL_STRIP | Status: DC

## 2012-04-13 NOTE — Patient Instructions (Addendum)
We are checking your LDL (cholesterol) and hgba1c today along with urine to check for protein .  We will call in your prescriptions once I  see your labs.

## 2012-04-13 NOTE — Progress Notes (Signed)
Patient ID: Chad Bailey, male   DOB: 03/17/1930, 78 y.o.   MRN: [id number]   Patient Active Problem List  Diagnosis  . HYPERLIPIDEMIA-MIXED  . HYPERTENSION, BENIGN  . ATRIAL FIBRILLATION  . History of tobacco abuse  . gouty arthritis  . Type 2 diabetes mellitus with insulin therapy    Subjective:  CC:   Chief Complaint  Patient presents with  . Follow-up    needs lab    HPI:   Chad Muesingis a 78 y.o. male who presents Follow up on Diabetes Mellitus insulin dependent, gouty arthritis, hyperlipidemia, hypertension and atrial fibrillation.  He has been lost to follow up since April 2012.  hgba1c was 8.1 then ,  Cholesterol excellent, no proteinuria.   Atrial flutter was diagnosed in Jan 2012 and he was referred to [facility] cardiology.  Rate controlled, anticoagulated initially with coumadin , now on pradaxa.  His last eye exam was by [facility] in May and reportedly normal.  He has been taking his medications as directed and checks blood sugars twice daily.  His fastings  have been 102 to 150 and pre dinner sugars range from 150 to 190.  No history of recent lows.  His insulin dose is based on a sliding scale of 14 to 16 units. He is requesting 90 day refills of all of his medications but has not had labs in a year.  Rx to be sent to [facility].  He was given an rx for test strips and lancets.   He denies any recent falls,.  No fluid retention , chest pain , orthopnea or DOE. Previously on Lipitor but since loss to follow up it has not been renewed.    Past Medical History  Diagnosis Date  . Non-insulin dependent diabetes mellitus   . A-fib   . Hyperlipidemia   . Fracture of left ankle   . Hypertension   . History of tobacco abuse   . gouty arthritis     Past Surgical History  Procedure Date  . Left ankle  1996    s/p surgery with screws         The following portions of the patient's history were reviewed and updated as appropriate: Allergies, current medications, and  problem list.    Review of Systems:  Comprehensive  review of systems was negative except those addressed in the HPI,     History   Social History  . Marital Status: Married    Spouse Name: N/A    Number of Children: N/A  . Years of Education: N/A   Occupational History  . Not on file.   Social History Main Topics  . Smoking status: Former Smoker -- 6.0 packs/day for 56 years    Types: Cigarettes, Pipe    Quit date: 10/05/2002  . Smokeless tobacco: Current User    Types: Chew   Comment: smoked 6 pipes/day;smoked cigarettes 1/2 PPD ,quit chewing tobacco in 2004  . Alcohol Use: Not on file  . Drug Use: Not on file  . Sexually Active: Not on file   Other Topics Concern  . Not on file   Social History Narrative  . No narrative on file    Objective:  BP 138/80  Pulse 56  Temp 98.1 F (36.7 C) (Oral)  Resp 16  Wt 155 lb (70.308 kg)  SpO2 96%  General appearance: alert, cooperative and appears stated age Ears: normal TM's and external ear canals both ears Throat: lips, mucosa, and tongue normal; teeth and gums  normal Neck: no adenopathy, no carotid bruit, supple, symmetrical, trachea midline and thyroid not enlarged, symmetric, no tenderness/mass/nodules Back: symmetric, no curvature. ROM normal. No CVA tenderness. Lungs: clear to auscultation bilaterally Heart: regular rate and rhythm, S1, S2 normal, no murmur, click, rub or gallop Abdomen: soft, non-tender; bowel sounds normal; no masses,  no organomegaly Pulses: 2+ and symmetric Skin: Skin color, texture, turgor normal. No rashes or lesions Lymph nodes: Cervical, supraclavicular, and axillary nodes normal.  Assessment and Plan:  Type 2 diabetes mellitus with insulin therapy Improved by hgba1c of 7.5 compared to last years 8.1, but not at goal,  He is using insulin once daily and metformin.  I will have him submit a log of his blood sugars and insulin doses for the next two weeks and make adjustments to  regimen. Foot exam notable for pes planus and dystrophic toe nails but pulses and sensation intact.  Up to date on eye exams.  No proteinuria will consider adding lisinopril (resuming) it at next visit if bp can tolerate it .   gouty arthritis Currently quiescent.  Not on preventive meds  Due to infrequency of attacks.   HYPERTENSION, BENIGN Well controlled on current regimen. Renal function stable, no changes today. will add lisinopril at next visit.    HYPERLIPIDEMIA-MIXED Previously on statin therapy with good tolerance, but lost to followup,  LDl < 70 is goal and is pending. Will resume therapy if indicated.    Updated Medication List Outpatient Encounter Prescriptions as of 04/13/2012  Medication Sig Dispense Refill  . aspirin 81 MG tablet Take 81 mg by mouth daily.        . cholecalciferol (VITAMIN D) 1000 UNITS tablet Take 1,000 Units by mouth daily.      . dabigatran (PRADAXA) 150 MG CAPS Take 1 capsule (150 mg total) by mouth every 12 (twelve) hours.  60 capsule  4  . Insulin Pen Needle (NOVOFINE) 30G X 8 MM MISC Inject 1 packet into the skin as needed.        . metFORMIN (GLUCOPHAGE) 1000 MG tablet TAKE ONE TABLET BY MOUTH TWICE DAILY  180 tablet  2  . metoprolol tartrate (LOPRESSOR) 25 MG tablet TAKE ONE TABLET BY MOUTH TWICE DAILY  180 tablet  1  . Multiple Vitamin (MULTIVITAMIN) tablet Take 1 tablet by mouth daily.        Marland Kitchen NOVOLOG MIX 70/30 FLEXPEN (70-30) 100 UNIT/ML injection INJECT 18 UNITS SUBCUTANEOUSLY 20 MINUTES PRIOR TO DINNER  15 mL  2  . Tamsulosin HCl (FLOMAX) 0.4 MG CAPS TAKE ONE CAPSULE BY MOUTH EVERY DAY  90 capsule  3  . terazosin (HYTRIN) 2 MG capsule TAKE ONE CAPSULE BY MOUTH EVERY DAY  90 capsule  3  . glucose blood (ACCU-CHEK INSTANT PLUS TEST) test strip Use as instructed  100 each  12  . DISCONTD: metFORMIN (GLUMETZA) 1000 MG (MOD) 24 hr tablet Take 1,000 mg by mouth 2 (two) times daily with a meal.        . DISCONTD: Saw Palmetto 450 MG CAPS Take 1  capsule by mouth 2 (two) times daily.           Orders Placed This Encounter  Procedures  . LDL cholesterol, direct  . Hemoglobin A1c  . COMPLETE METABOLIC PANEL WITH GFR  . CBC with Differential  . Microalbumin / creatinine urine ratio  . HM COLONOSCOPY    Return in about 3 months (around 07/14/2012).

## 2012-04-14 ENCOUNTER — Encounter: Payer: Self-pay | Admitting: Internal Medicine

## 2012-04-14 DIAGNOSIS — I10 Essential (primary) hypertension: Secondary | ICD-10-CM | POA: Insufficient documentation

## 2012-04-14 DIAGNOSIS — E113299 Type 2 diabetes mellitus with mild nonproliferative diabetic retinopathy without macular edema, unspecified eye: Secondary | ICD-10-CM | POA: Insufficient documentation

## 2012-04-14 DIAGNOSIS — M1990 Unspecified osteoarthritis, unspecified site: Secondary | ICD-10-CM | POA: Insufficient documentation

## 2012-04-14 DIAGNOSIS — Z87891 Personal history of nicotine dependence: Secondary | ICD-10-CM | POA: Insufficient documentation

## 2012-04-14 LAB — CBC WITH DIFFERENTIAL/PLATELET
Basophils Absolute: 0 10*3/uL (ref 0.0–0.1)
Eosinophils Absolute: 0.1 10*3/uL (ref 0.0–0.7)
Hemoglobin: 13.2 g/dL (ref 13.0–17.0)
Lymphocytes Relative: 29.9 % (ref 12.0–46.0)
MCHC: 33 g/dL (ref 30.0–36.0)
Monocytes Absolute: 0.5 10*3/uL (ref 0.1–1.0)
Neutro Abs: 3.1 10*3/uL (ref 1.4–7.7)
RDW: 13.3 % (ref 11.5–14.6)

## 2012-04-14 LAB — COMPLETE METABOLIC PANEL WITH GFR
ALT: 14 U/L (ref 0–53)
CO2: 28 mEq/L (ref 19–32)
Calcium: 9.5 mg/dL (ref 8.4–10.5)
Chloride: 102 mEq/L (ref 96–112)
GFR, Est African American: >89 mL/min
Sodium: 140 mEq/L (ref 135–145)
Total Bilirubin: 0.5 mg/dL (ref 0.3–1.2)
Total Protein: 6.2 g/dL (ref 6.0–8.3)

## 2012-04-14 LAB — MICROALBUMIN / CREATININE URINE RATIO: Microalb Creat Ratio: 1 mg/g (ref 0.0–30.0)

## 2012-04-14 LAB — HEMOGLOBIN A1C: Hgb A1c MFr Bld: 7.5 % — ABNORMAL HIGH (ref 4.6–6.5)

## 2012-04-14 NOTE — Assessment & Plan Note (Signed)
Previously on statin therapy with good tolerance, but lost to followup,  LDl < 70 is goal and is pending. Will resume therapy if indicated.

## 2012-04-14 NOTE — Assessment & Plan Note (Addendum)
Improved by hgba1c of 7.5 compared to last years 8.1, but not at goal,  He is using insulin once daily and metformin.  I will have him submit a log of his blood sugars and insulin doses for the next two weeks and make adjustments to regimen. Foot exam notable for pes planus and dystrophic toe nails but pulses and sensation intact.  Up to date on eye exams.  No proteinuria will consider adding lisinopril (resuming) it at next visit if bp can tolerate it .

## 2012-04-14 NOTE — Assessment & Plan Note (Signed)
Currently quiescent.  Not on preventive meds  Due to infrequency of attacks.

## 2012-04-14 NOTE — Assessment & Plan Note (Deleted)
Well controlled on current regimen. Renal function stable, no changes today. will add lisinopril at next visit.   

## 2012-04-14 NOTE — Assessment & Plan Note (Signed)
Well controlled on current regimen. Renal function stable, no changes today. will add lisinopril at next visit.

## 2012-04-19 ENCOUNTER — Other Ambulatory Visit: Payer: Self-pay | Admitting: *Deleted

## 2012-04-19 LAB — FECAL OCCULT BLOOD, GUAIAC: Fecal Occult Blood: NEGATIVE

## 2012-04-19 MED ORDER — INSULIN PEN NEEDLE 30G X 8 MM MISC: 1.0000 | Status: DC | PRN

## 2012-04-19 MED ORDER — TAMSULOSIN HCL 0.4 MG PO CAPS: 0.4000 mg | ORAL_CAPSULE | Freq: Every day | ORAL | Status: DC

## 2012-04-19 MED ORDER — METFORMIN HCL 1000 MG PO TABS: 1000.0000 mg | ORAL_TABLET | Freq: Every day | ORAL | Status: DC

## 2012-04-19 MED ORDER — TERAZOSIN HCL 2 MG PO CAPS: 2.0000 mg | ORAL_CAPSULE | Freq: Every day | ORAL | Status: DC

## 2012-04-19 MED ORDER — METOPROLOL TARTRATE 25 MG PO TABS: 25.0000 mg | ORAL_TABLET | Freq: Two times a day (BID) | ORAL | Status: DC

## 2012-04-19 MED ORDER — GLUCOSE BLOOD VI STRP: ORAL_STRIP | Status: DC

## 2012-04-20 ENCOUNTER — Other Ambulatory Visit: Payer: Self-pay | Admitting: *Deleted

## 2012-04-20 MED ORDER — METFORMIN HCL 1000 MG PO TABS: 1000.0000 mg | ORAL_TABLET | Freq: Two times a day (BID) | ORAL | Status: DC

## 2012-04-26 ENCOUNTER — Encounter: Payer: Self-pay | Admitting: Internal Medicine

## 2012-04-29 ENCOUNTER — Other Ambulatory Visit: Payer: Self-pay | Admitting: Internal Medicine

## 2012-04-29 DIAGNOSIS — Z1211 Encounter for screening for malignant neoplasm of colon: Secondary | ICD-10-CM

## 2012-05-02 ENCOUNTER — Other Ambulatory Visit: Payer: Medicare Other

## 2012-05-02 DIAGNOSIS — Z1211 Encounter for screening for malignant neoplasm of colon: Secondary | ICD-10-CM

## 2012-05-02 LAB — FECAL OCCULT BLOOD, IMMUNOCHEMICAL: Fecal Occult Bld: NEGATIVE

## 2012-05-08 ENCOUNTER — Telehealth: Payer: Self-pay | Admitting: Internal Medicine

## 2012-05-08 NOTE — Telephone Encounter (Signed)
Blood sugars reviewed.  For the most part the sugars are well controlled, so no changes to regimen.

## 2012-05-09 NOTE — Telephone Encounter (Signed)
Patient notified

## 2012-06-23 ENCOUNTER — Other Ambulatory Visit: Payer: Self-pay | Admitting: *Deleted

## 2012-06-23 MED ORDER — METFORMIN HCL 1000 MG PO TABS: 1000.0000 mg | ORAL_TABLET | Freq: Two times a day (BID) | ORAL | Status: DC

## 2012-07-26 ENCOUNTER — Other Ambulatory Visit: Payer: Self-pay | Admitting: Internal Medicine

## 2012-08-18 ENCOUNTER — Ambulatory Visit (INDEPENDENT_AMBULATORY_CARE_PROVIDER_SITE_OTHER): Payer: Medicare Other | Admitting: Cardiovascular Disease

## 2012-08-18 ENCOUNTER — Encounter: Payer: Self-pay | Admitting: Cardiovascular Disease

## 2012-08-18 VITALS — BP 136/68 | HR 60 | Ht 72.0 in | Wt 155.5 lb

## 2012-08-18 DIAGNOSIS — I4891 Unspecified atrial fibrillation: Secondary | ICD-10-CM

## 2012-08-18 DIAGNOSIS — E119 Type 2 diabetes mellitus without complications: Secondary | ICD-10-CM

## 2012-08-18 DIAGNOSIS — E785 Hyperlipidemia, unspecified: Secondary | ICD-10-CM

## 2012-08-18 DIAGNOSIS — I10 Essential (primary) hypertension: Secondary | ICD-10-CM

## 2012-08-18 NOTE — Progress Notes (Signed)
Patient ID: Chad Bailey, male    DOB: 05-18-1930, 78 y.o.   MRN: [id number]  HPI Comments: 78 year old gentleman, patient of Dr. Darrick Huntsman, with a history of diabetes, hypertension, hyperlipidemia, long smoking history who stopped 10 years ago, Presenting initially in January 2012 with atrial fibrillation, started on anticoagulation who presents for routine followup.  Noted to be in atrial fibrillation in 2012, during an office visit.  He had been chopping wood the day before. He converted to normal sinus rhythm by the next visit to the office. No medications have been changed.  Blood pressure and heart rate are relatively well controlled. Overall he reports is doing well.  Old outside Echocardiogram suggested borderline normal ejection fraction of 50-55%, normal left atrial size, normal right ventricular systolic pressures, mild TR, mild MR otherwise essentially normal study.   Notes indicate Holter monitor was performed for 24 hours August 21, 2010 joint average heart rate 71 beats per minute. Report suggests paroxysmal atrial fibrillation with primary rhythm being sinus rhythm with atrial flutter/fibrillation noted  EKG today shows normal sinus rhythm with rate 60 beats per minute, rare APC   Outpatient Encounter Prescriptions as of 08/18/2012  Medication Sig Dispense Refill  . aspirin 81 MG tablet Take 81 mg by mouth daily.        . cholecalciferol (VITAMIN D) 1000 UNITS tablet Take 1,000 Units by mouth daily.      . dabigatran (PRADAXA) 150 MG CAPS Take 1 capsule (150 mg total) by mouth every 12 (twelve) hours.  60 capsule  4  . glucose blood (ACCU-CHEK INSTANT PLUS TEST) test strip Check blood sugar readings twice daily.  DX: 250.00  100 each  12  . Insulin Pen Needle (NOVOFINE) 30G X 8 MM MISC Inject 10 each into the skin as needed.  180 each  2  . metFORMIN (GLUCOPHAGE) 1000 MG tablet Take 1 tablet (1,000 mg total) by mouth 2 (two) times daily with a meal.  180 tablet  3  .  metoprolol tartrate (LOPRESSOR) 25 MG tablet Take 1 tablet (25 mg total) by mouth 2 (two) times daily.  180 tablet  2  . Multiple Vitamin (MULTIVITAMIN) tablet Take 1 tablet by mouth daily.        Marland Kitchen NOVOLOG MIX 70/30 FLEXPEN (70-30) 100 UNIT/ML injection INJECT 18 UNITS SUBCUTANEOUSLY 20 MINUTES PRIOR TO DINNER  15 mL  2  . Tamsulosin HCl (FLOMAX) 0.4 MG CAPS Take 1 capsule (0.4 mg total) by mouth daily.  90 capsule  3  . terazosin (HYTRIN) 2 MG capsule Take 1 capsule (2 mg total) by mouth at bedtime.  90 capsule  3    Review of Systems  Constitutional: Negative.   HENT: Negative.   Eyes: Negative.   Respiratory: Negative.   Cardiovascular: Negative.   Gastrointestinal: Negative.   Musculoskeletal: Negative.   Skin: Negative.   Neurological: Negative.   Hematological: Negative.   Psychiatric/Behavioral: Negative.   All other systems reviewed and are negative.   BP 136/68  Pulse 60  Ht 6' (1.829 m)  Wt 155 lb 8 oz (70.534 kg)  BMI 21.09 kg/m2  Physical Exam  Nursing note and vitals reviewed. Constitutional: He is oriented to person, place, and time. He appears well-developed and well-nourished.  HENT:  Head: Normocephalic.  Nose: Nose normal.  Mouth/Throat: Oropharynx is clear and moist.  Eyes: Conjunctivae normal are normal. Pupils are equal, round, and reactive to light.  Neck: Normal range of motion. Neck supple. No JVD present.  Cardiovascular: Normal rate, S1 normal, S2 normal and intact distal pulses.  An irregularly irregular rhythm present. Exam reveals no gallop and no friction rub.   Murmur heard.  Crescendo systolic murmur is present with a grade of 2/6  Pulmonary/Chest: Effort normal and breath sounds normal. No respiratory distress. He has no wheezes. He has no rales. He exhibits no tenderness.  Abdominal: Soft. Bowel sounds are normal. He exhibits no distension. There is no tenderness.  Musculoskeletal: Normal range of motion. He exhibits no edema and no  tenderness.  Lymphadenopathy:    He has no cervical adenopathy.  Neurological: He is alert and oriented to person, place, and time. Coordination normal.  Skin: Skin is warm and dry. No rash noted. No erythema.  Psychiatric: He has a normal mood and affect. His behavior is normal. Judgment and thought content normal.           Assessment and Plan

## 2012-08-18 NOTE — Assessment & Plan Note (Signed)
Blood pressure is well controlled on today's visit. No changes made to the medications. 

## 2012-08-18 NOTE — Assessment & Plan Note (Signed)
We have encouraged continued exercise, careful diet management in an effort to lose weight. 

## 2012-08-18 NOTE — Assessment & Plan Note (Signed)
We will check his cholesterol and LFTs. He was previously on Lipitor 20 mg daily with good control. Uncertain if he stopped this on his own last year.

## 2012-08-18 NOTE — Patient Instructions (Addendum)
You are doing well. No medication changes were made.  Please call us if you have new issues that need to be addressed before your next appt.  Your physician wants you to follow-up in: 6 months.  You will receive a reminder letter in the mail two months in advance. If you don't receive a letter, please call our office to schedule the follow-up appointment.   

## 2012-08-18 NOTE — Assessment & Plan Note (Addendum)
Chronic atrial fibrillation on pradaxa. Rate is well controlled.

## 2012-08-19 ENCOUNTER — Ambulatory Visit (INDEPENDENT_AMBULATORY_CARE_PROVIDER_SITE_OTHER): Payer: Medicare Other

## 2012-08-19 DIAGNOSIS — I10 Essential (primary) hypertension: Secondary | ICD-10-CM

## 2012-08-19 DIAGNOSIS — E785 Hyperlipidemia, unspecified: Secondary | ICD-10-CM

## 2012-08-19 DIAGNOSIS — E119 Type 2 diabetes mellitus without complications: Secondary | ICD-10-CM

## 2012-08-20 LAB — HEPATIC FUNCTION PANEL
ALT: 14 IU/L (ref 0–44)
AST: 17 IU/L (ref 0–40)
Bilirubin, Direct: 0.11 mg/dL (ref 0.00–0.40)

## 2012-08-20 LAB — LIPID PANEL
Chol/HDL Ratio: 3.7 ratio units (ref 0.0–5.0)
Cholesterol, Total: 183 mg/dL (ref 100–199)
HDL: 49 mg/dL (ref >39)
LDL Calculated: 120 mg/dL — ABNORMAL HIGH (ref 0–99)
Triglycerides: 68 mg/dL (ref 0–149)

## 2012-08-23 ENCOUNTER — Other Ambulatory Visit: Payer: Self-pay

## 2012-08-23 MED ORDER — ATORVASTATIN CALCIUM 10 MG PO TABS: 10.0000 mg | ORAL_TABLET | Freq: Every day | ORAL | Status: DC

## 2012-08-23 NOTE — Progress Notes (Signed)
rx called to pharmacy 

## 2012-09-29 ENCOUNTER — Other Ambulatory Visit: Payer: Self-pay | Admitting: Internal Medicine

## 2012-09-29 NOTE — Telephone Encounter (Signed)
Med filled.  

## 2012-10-05 DIAGNOSIS — E041 Nontoxic single thyroid nodule: Secondary | ICD-10-CM

## 2012-10-05 DIAGNOSIS — H349 Unspecified retinal vascular occlusion: Secondary | ICD-10-CM

## 2012-10-05 HISTORY — DX: Nontoxic single thyroid nodule: E04.1

## 2012-10-05 HISTORY — DX: Unspecified retinal vascular occlusion: H34.9

## 2012-11-19 ENCOUNTER — Other Ambulatory Visit: Payer: Self-pay | Admitting: Cardiovascular Disease

## 2012-11-21 ENCOUNTER — Other Ambulatory Visit: Payer: Self-pay | Admitting: *Deleted

## 2012-11-21 MED ORDER — DABIGATRAN ETEXILATE MESYLATE 150 MG PO CAPS: 150.0000 mg | ORAL_CAPSULE | Freq: Two times a day (BID) | ORAL | Status: DC

## 2012-11-21 NOTE — Telephone Encounter (Signed)
Refilled Pradaxa sent to [facility].

## 2012-11-24 ENCOUNTER — Telehealth: Payer: Self-pay | Admitting: Internal Medicine

## 2012-11-24 MED ORDER — INSULIN PEN NEEDLE 30G X 8 MM MISC: Status: DC

## 2012-11-24 MED ORDER — GLUCOSE BLOOD VI STRP: ORAL_STRIP | Status: DC

## 2012-11-24 MED ORDER — SAFETY LANCET 28G/PRESSURE ACT MISC: 1.0000 | Status: DC | PRN

## 2012-11-24 NOTE — Telephone Encounter (Signed)
Pt came in today wanting to rx on the following meds sent to [facility] in [place].  He has new drug store Diabetic testing supplies - 90 day supply lancets  2-3 times per day Testing strips  2-3 times per day Meter as needed Lancing devices as needed Diagnosis code  Pt thought he had baer meter but wasn't for sure

## 2012-11-24 NOTE — Telephone Encounter (Signed)
Med filled.  

## 2012-11-28 ENCOUNTER — Other Ambulatory Visit: Payer: Self-pay | Admitting: Internal Medicine

## 2012-11-28 NOTE — Telephone Encounter (Signed)
Pt has not been seen or had an A1c since 7/13. Please advise.

## 2012-11-28 NOTE — Telephone Encounter (Signed)
Received refill request electronically. Last office visit 04/13/12. Is it okay to refill medication?

## 2012-11-28 NOTE — Telephone Encounter (Signed)
Must be seen,  Ok to give 30 day supply to tide him over

## 2012-11-30 ENCOUNTER — Telehealth: Payer: Self-pay | Admitting: Internal Medicine

## 2012-11-30 NOTE — Telephone Encounter (Signed)
Patient calling to report:   Meter type:  Bayer Contour #strips: 50 Box#: [id number]  Ultilet lancets 100 sterile tip one time use lancets in a box.  If you have questions contact pt [phone].

## 2012-12-02 MED ORDER — GLUCOSE BLOOD VI STRP: ORAL_STRIP | Status: DC

## 2012-12-02 NOTE — Telephone Encounter (Signed)
Med refilled per pt.

## 2012-12-05 ENCOUNTER — Telehealth: Payer: Self-pay | Admitting: *Deleted

## 2012-12-05 NOTE — Telephone Encounter (Signed)
Note from [facility]:  For the Lancets, rx needs to say how often to test and dx code?  Rx for meter also   [phone] (fax)

## 2012-12-06 NOTE — Telephone Encounter (Signed)
Please amend the rx for lancets and test strips to say "test twice daily"  Also call in glucometer per request   250.00

## 2012-12-07 ENCOUNTER — Telehealth: Payer: Self-pay | Admitting: Internal Medicine

## 2012-12-07 NOTE — Telephone Encounter (Signed)
Caller: Vernona Rieger -. [facility]; Phone: [phone]; Reason for Call: [facility] pharmacy calling to request specific directions on pt's test strips with Dx code included resent to pharmacy.  PLEASE F/U WITH LAURA, RPH WITH QUESTIONS.  THANK YOU.

## 2012-12-07 NOTE — Telephone Encounter (Signed)
Pt Rx called in and fixed.

## 2012-12-12 NOTE — Telephone Encounter (Signed)
See phone encounter dated for 12-07-12. Issue taken care of.

## 2012-12-16 ENCOUNTER — Ambulatory Visit (INDEPENDENT_AMBULATORY_CARE_PROVIDER_SITE_OTHER): Payer: Medicare Other | Admitting: Internal Medicine

## 2012-12-16 ENCOUNTER — Encounter: Payer: Self-pay | Admitting: Internal Medicine

## 2012-12-16 VITALS — BP 130/68 | HR 62 | Temp 97.5°F | Resp 15 | Wt 155.5 lb

## 2012-12-16 DIAGNOSIS — E118 Type 2 diabetes mellitus with unspecified complications: Secondary | ICD-10-CM

## 2012-12-16 DIAGNOSIS — R0989 Other specified symptoms and signs involving the circulatory and respiratory systems: Secondary | ICD-10-CM

## 2012-12-16 DIAGNOSIS — Z23 Encounter for immunization: Secondary | ICD-10-CM

## 2012-12-16 DIAGNOSIS — Z79899 Other long term (current) drug therapy: Secondary | ICD-10-CM

## 2012-12-16 DIAGNOSIS — IMO0001 Reserved for inherently not codable concepts without codable children: Secondary | ICD-10-CM

## 2012-12-16 DIAGNOSIS — E1169 Type 2 diabetes mellitus with other specified complication: Secondary | ICD-10-CM

## 2012-12-16 DIAGNOSIS — E119 Type 2 diabetes mellitus without complications: Secondary | ICD-10-CM

## 2012-12-16 DIAGNOSIS — L97509 Non-pressure chronic ulcer of other part of unspecified foot with unspecified severity: Secondary | ICD-10-CM

## 2012-12-16 DIAGNOSIS — I10 Essential (primary) hypertension: Secondary | ICD-10-CM

## 2012-12-16 LAB — COMPREHENSIVE METABOLIC PANEL
ALT: 20 U/L (ref 0–53)
AST: 26 U/L (ref 0–37)
Alkaline Phosphatase: 48 U/L (ref 39–117)
BUN: 20 mg/dL (ref 6–23)
Creatinine, Ser: 0.8 mg/dL (ref 0.4–1.5)
Total Bilirubin: 1.1 mg/dL (ref 0.3–1.2)

## 2012-12-16 LAB — LDL CHOLESTEROL, DIRECT: Direct LDL: 53 mg/dL

## 2012-12-16 LAB — LIPID PANEL
HDL: 42.6 mg/dL (ref >39.00)
LDL Cholesterol: 55 mg/dL (ref 0–99)
Total CHOL/HDL Ratio: 3
Triglycerides: 77 mg/dL (ref 0.0–149.0)
VLDL: 15.4 mg/dL (ref 0.0–40.0)

## 2012-12-16 LAB — HEMOGLOBIN A1C: Hgb A1c MFr Bld: 7.5 % — ABNORMAL HIGH (ref 4.6–6.5)

## 2012-12-16 LAB — MICROALBUMIN / CREATININE URINE RATIO
Creatinine,U: 215.1 mg/dL
Microalb Creat Ratio: 1.3 mg/g (ref 0.0–30.0)

## 2012-12-16 MED ORDER — INSULIN ASPART PROT & ASPART (70-30 MIX) 100 UNIT/ML ~~LOC~~ SUSP: 10.0000 [IU] | Freq: Two times a day (BID) | SUBCUTANEOUS | Status: DC

## 2012-12-16 MED ORDER — LANCETS MISC: Status: DC

## 2012-12-16 NOTE — Progress Notes (Signed)
Patient ID: Chad Bailey, male   DOB: [birth year], 79 y.o.   MRN: [id number]   Patient Active Problem List  Diagnosis  . HYPERLIPIDEMIA-MIXED  . HYPERTENSION, BENIGN  . ATRIAL FIBRILLATION  . History of tobacco abuse  . gouty arthritis  . Type 2 diabetes mellitus with insulin therapy    Subjective:  CC:   Chief Complaint  Patient presents with  . Follow-up    6 month    HPI:   Chad Muesingis a 79 y.o. male who present for follow up on chronic conditions  Including diabetes  mellitus, hypertension and hperlipidemia.  He was last seen July 2013, and his last A1c was 7.5 in September. Did not return as reqeusted bc he was staying in [place] with his daughter's damily for the last 6 months .He is managing his diabetes with twice daily novolog 70/30 insulin in a sliding scale but typically takes  6 units in the am and 10 to 16 at night based on his pre meal blood sugars and his estimation of the carb content of his dinner. H etypically eats a salad every night and a meat which is not breaded. .No lows.  BS are for the most part  out of acceptable range fasting (20% were < 125) and 50% were < 150 post prandially.  No lows.    Feet stay cold,  Toenails too tough to trim himself . Has not seen Dr. Ether Griffins in a few years.  Tolerating resumed statin dose by Dr Mariah Milling without myalgias.  Fasting today for anticopation of labs.     Past Medical History  Diagnosis Date  . Non-insulin dependent diabetes mellitus   . A-fib   . Hyperlipidemia   . Fracture of left ankle   . Hypertension   . History of tobacco abuse   . gouty arthritis     Past Surgical History  Procedure Laterality Date  . Left ankle   1996    s/p surgery with screws       The following portions of the patient's history were reviewed and updated as appropriate: Allergies, current medications, and problem list.    Review of Systems:   Patient denies headache, fevers, malaise, unintentional weight loss, skin rash, eye  pain, sinus congestion and sinus pain, sore throat, dysphagia,  hemoptysis , cough, dyspnea, wheezing, chest pain, palpitations, orthopnea, edema, abdominal pain, nausea, melena, diarrhea, constipation, flank pain, dysuria, hematuria, urinary  Frequency, nocturia, numbness, tingling, seizures,  Focal weakness, Loss of consciousness,  Tremor, insomnia, depression, anxiety, and suicidal ideation.      History   Social History  . Marital Status: Married    Spouse Name: N/A    Number of Children: N/A  . Years of Education: N/A   Occupational History  . Not on file.   Social History Main Topics  . Smoking status: Former Smoker -- 6.00 packs/day for 56 years    Types: Cigarettes, Pipe    Quit date: 10/05/2002  . Smokeless tobacco: Current User    Types: Chew     Comment: smoked 6 pipes/day;smoked cigarettes 1/2 PPD ,quit chewing tobacco in 2004  . Alcohol Use: Not on file  . Drug Use: Not on file  . Sexually Active: Not on file   Other Topics Concern  . Not on file   Social History Narrative  . No narrative on file    Objective:  BP 130/68  Pulse 62  Temp(Src) 97.5 F (36.4 C) (Oral)  Resp 15  Wt  155 lb 8 oz (70.534 kg)  BMI 21.08 kg/m2  General appearance: alert, cooperative and appears stated age Ears: normal TM's and external ear canals both ears Throat: lips, mucosa, and tongue normal; teeth and gums normal Neck: no adenopathy, no carotid bruit, supple, symmetrical, trachea midline and thyroid not enlarged, symmetric, no tenderness/mass/nodules Back: symmetric, no curvature. ROM normal. No CVA tenderness. Lungs: clear to auscultation bilaterally Heart: regular rate and rhythm, S1, S2 normal, no murmur, click, rub or gallop Abdomen: soft, non-tender; bowel sounds normal; no masses,  no organomegaly Pulses: 2+ and symmetric Skin: Skin color, texture, turgor normal. No rashes or lesions Lymph nodes: Cervical, supraclavicular, and axillary nodes normal. Foot exam:   Nails are long, thick and curling.  1st metatarsal callouses,  Sensation intact to microfilament.  Pulses feeble.   Assessment and Plan:  Type 2 diabetes mellitus with insulin therapy His control has been stable, A1c is still 7.5. Given his age I'm reluctant to make any major changes and precipitate a hypoglycemic event. He is on the appropriate medications. He has minimal proteinuria. No changes today except several suggestions medicine lower carbohydrate breads that he continues to help lower his carbohydrate intake. Foot exam today notes decreased  pulses in both feet and has been referred to  Dr. Thomasena Edis office for ABIs/   HYPERTENSION, BENIGN Well controlled on current regimen. Renal function stable, and he has a microscopic amount of proteinuria. I will recommend trial of low-dose lisinopril to add to his current regimen.   Updated Medication List Outpatient Encounter Prescriptions as of 12/16/2012  Medication Sig Dispense Refill  . aspirin 81 MG tablet Take 81 mg by mouth daily.        Marland Kitchen atorvastatin (LIPITOR) 10 MG tablet Take 1 tablet (10 mg total) by mouth daily.  90 tablet  3  . cholecalciferol (VITAMIN D) 1000 UNITS tablet Take 1,000 Units by mouth daily.      . dabigatran (PRADAXA) 150 MG CAPS Take 1 capsule (150 mg total) by mouth every 12 (twelve) hours.  60 capsule  4  . glucose blood (BAYER CONTOUR TEST) test strip Use one strip every time glucose levels are to be tested.   Dx. 250.00  100 each  12  . insulin aspart protamine-insulin aspart (NOVOLOG MIX 70/30 FLEXPEN) (70-30) 100 UNIT/ML injection Inject 10 Units into the skin 2 (two) times daily with a meal.  15 mL  3  . Insulin Pen Needle (NOVOFINE) 30G X 8 MM MISC Use one needle each time insulin is injected into the skin.  180 each  2  . metFORMIN (GLUCOPHAGE) 1000 MG tablet Take 1 tablet (1,000 mg total) by mouth 2 (two) times daily with a meal.  180 tablet  3  . metoprolol tartrate (LOPRESSOR) 25 MG tablet Take 1 tablet  (25 mg total) by mouth 2 (two) times daily.  180 tablet  2  . Multiple Vitamin (MULTIVITAMIN) tablet Take 1 tablet by mouth daily.        . Tamsulosin HCl (FLOMAX) 0.4 MG CAPS Take 1 capsule (0.4 mg total) by mouth daily.  90 capsule  3  . terazosin (HYTRIN) 2 MG capsule Take 1 capsule (2 mg total) by mouth at bedtime.  90 capsule  3  . [DISCONTINUED] Lancets (SAFETY LANCET 28G/PRESSURE ACT) MISC 1 each by Does not apply route as needed (each time blood sugar is tested.).  100 each  12  . [DISCONTINUED] NOVOLOG MIX 70/30 FLEXPEN (70-30) 100 UNIT/ML injection INJECT 18  UNITS SUBCUTANEOUSLY 20 MINUTES PRIOR TO DINNER  15 mL  3  . Lancets MISC Use to check sugars twice daily  100 each  11  . lisinopril (PRINIVIL,ZESTRIL) 5 MG tablet Take 1 tablet (5 mg total) by mouth daily.  90 tablet  3  . [DISCONTINUED] terazosin (HYTRIN) 2 MG capsule TAKE ONE CAPSULE BY MOUTH EVERY DAY  90 capsule  2   No facility-administered encounter medications on file as of 12/16/2012.     Orders Placed This Encounter  Procedures  . Pneumococcal polysaccharide vaccine 23-valent greater than or equal to 2yo subcutaneous/IM  . Comprehensive metabolic panel  . Lipid panel  . Hemoglobin A1c  . Microalbumin / creatinine urine ratio  . Ambulatory referral to Podiatry  . HM DIABETES EYE EXAM  . HM DIABETES FOOT EXAM  . HM DIABETES EYE EXAM  . Ankle brachial index    Return in about 3 months (around 03/18/2013).

## 2012-12-16 NOTE — Patient Instructions (Addendum)
Use can have pasta if you use the low glycemic pasta by [employer] .  It will not affect your sugars the way regular pasta does    Please try the follow low carb bread products:  Mission carb balance whole wheat tortillas (6 net carbs,  26 g fiber!)) Sandwich thins by Debroah Loop (16 net carbs ,  5 g fiber) Pita bread and flatbread by [employer]  (4 carbs,  50 cal)   You need to have the circulation in your feet done by Dr. Windell Hummingbird office in May

## 2012-12-18 MED ORDER — LISINOPRIL 5 MG PO TABS: 5.0000 mg | ORAL_TABLET | Freq: Every day | ORAL | Status: DC

## 2012-12-18 NOTE — Assessment & Plan Note (Addendum)
His control has been stable, A1c is still 7.5. Given his age I'm reluctant to make any major changes and precipitate a hypoglycemic event. He is not an an ACE Inhibitor and he has minimal proteinuria. Adding low dose lisinopril and making several dietary suggestions for lower carbohydrate breads that he continues to help lower his carbohydrate intake. Foot exam today notes decreased  pulses in both feet and has been referred to  Dr. Thomasena Edis office for ABIs/

## 2012-12-18 NOTE — Addendum Note (Signed)
Addended by: Sherlene Shams on: 12/18/2012 04:38 PM   Modules accepted: Orders

## 2012-12-18 NOTE — Assessment & Plan Note (Addendum)
Well controlled on current regimen. Renal function stable, and he has a microscopic amount of proteinuria. I will recommend trial of low-dose lisinopril to add to his current regimen.

## 2012-12-19 ENCOUNTER — Telehealth: Payer: Self-pay | Admitting: *Deleted

## 2012-12-19 MED ORDER — MICROLET LANCETS MISC: Status: DC

## 2012-12-19 NOTE — Telephone Encounter (Signed)
Refill Request  Microlet Lancets Mis  #100  Use to check twice daily   *Need Diagnoses Code*

## 2012-12-19 NOTE — Telephone Encounter (Signed)
MEds filled.

## 2012-12-27 ENCOUNTER — Other Ambulatory Visit (INDEPENDENT_AMBULATORY_CARE_PROVIDER_SITE_OTHER): Payer: Medicare Other

## 2012-12-27 DIAGNOSIS — Z79899 Other long term (current) drug therapy: Secondary | ICD-10-CM

## 2012-12-27 LAB — BASIC METABOLIC PANEL
BUN: 17 mg/dL (ref 6–23)
Chloride: 100 mEq/L (ref 96–112)
Glucose, Bld: 151 mg/dL — ABNORMAL HIGH (ref 70–99)
Potassium: 4.7 mEq/L (ref 3.5–5.1)

## 2013-01-03 DIAGNOSIS — H547 Unspecified visual loss: Secondary | ICD-10-CM

## 2013-01-03 HISTORY — DX: Unspecified visual loss: H54.7

## 2013-02-14 ENCOUNTER — Ambulatory Visit: Payer: Self-pay | Admitting: Neurology

## 2013-02-20 ENCOUNTER — Ambulatory Visit: Payer: Medicare Other | Admitting: Cardiovascular Disease

## 2013-02-22 ENCOUNTER — Ambulatory Visit: Payer: Medicare Other | Admitting: Cardiovascular Disease

## 2013-03-01 ENCOUNTER — Encounter: Payer: Self-pay | Admitting: Cardiovascular Disease

## 2013-03-01 ENCOUNTER — Encounter (INDEPENDENT_AMBULATORY_CARE_PROVIDER_SITE_OTHER): Payer: Medicare Other

## 2013-03-01 ENCOUNTER — Ambulatory Visit (INDEPENDENT_AMBULATORY_CARE_PROVIDER_SITE_OTHER): Payer: Medicare Other | Admitting: Cardiovascular Disease

## 2013-03-01 VITALS — BP 102/60 | HR 56 | Ht 72.0 in | Wt 158.2 lb

## 2013-03-01 DIAGNOSIS — I4891 Unspecified atrial fibrillation: Secondary | ICD-10-CM

## 2013-03-01 DIAGNOSIS — I739 Peripheral vascular disease, unspecified: Secondary | ICD-10-CM

## 2013-03-01 DIAGNOSIS — E118 Type 2 diabetes mellitus with unspecified complications: Secondary | ICD-10-CM

## 2013-03-01 DIAGNOSIS — R0602 Shortness of breath: Secondary | ICD-10-CM

## 2013-03-01 DIAGNOSIS — E785 Hyperlipidemia, unspecified: Secondary | ICD-10-CM

## 2013-03-01 DIAGNOSIS — E1151 Type 2 diabetes mellitus with diabetic peripheral angiopathy without gangrene: Secondary | ICD-10-CM | POA: Insufficient documentation

## 2013-03-01 DIAGNOSIS — E119 Type 2 diabetes mellitus without complications: Secondary | ICD-10-CM

## 2013-03-01 DIAGNOSIS — I6350 Cerebral infarction due to unspecified occlusion or stenosis of unspecified cerebral artery: Secondary | ICD-10-CM

## 2013-03-01 DIAGNOSIS — I639 Cerebral infarction, unspecified: Secondary | ICD-10-CM | POA: Insufficient documentation

## 2013-03-01 DIAGNOSIS — R0989 Other specified symptoms and signs involving the circulatory and respiratory systems: Secondary | ICD-10-CM

## 2013-03-01 DIAGNOSIS — I10 Essential (primary) hypertension: Secondary | ICD-10-CM

## 2013-03-01 LAB — HM DIABETES EYE EXAM

## 2013-03-01 NOTE — Assessment & Plan Note (Signed)
Vision loss on the left. Suspect embolic. Continue anticoagulation, aggressive lipid management. Discussed diabetes control with him.

## 2013-03-01 NOTE — Assessment & Plan Note (Signed)
Maintaining normal sinus rhythm. Currently on pradaxa and low-dose aspirin.

## 2013-03-01 NOTE — Patient Instructions (Addendum)
You are doing well. No medication changes were made.  We will schedule you for a Lower extremity arterial ultrasound to look for blockage, with Missy  Please call us if you have new issues that need to be addressed before your next appt.  Your physician wants you to follow-up in: 6 months.  You will receive a reminder letter in the mail two months in advance. If you don't receive a letter, please call our office to schedule the follow-up appointment.

## 2013-03-01 NOTE — Progress Notes (Signed)
Patient ID: Chad Bailey, male    DOB: 1930/01/06, 79 y.o.   MRN: [id number]  HPI Comments: 79 year old gentleman, patient of Dr. Darrick Huntsman, with a history of diabetes, hypertension, hyperlipidemia, long smoking history who stopped 10 years ago, Presenting initially in January 2012 with atrial fibrillation, started on anticoagulation who presents for routine followup.  He reports having recent stroke with loss of vision in his left eye. He was seen by ophthalmology who has referred him to neurology. Patient reports he had embolic plaque as a cause of his vision problem. He has occasional dizzy spells.   Noted to be in atrial fibrillation in 2012, during an office visit.  He had been chopping wood the day before. He converted to normal sinus rhythm by the next visit to the office. No medications have been changed.  Blood pressure and heart rate are relatively well controlled. Overall he reports is doing well. He has been maintaining normal sinus rhythm on his last several clinic visits. Hemoglobin A1c 7.5 several months ago Total cholesterol 113, LDL 53  Old outside Echocardiogram suggested borderline normal ejection fraction of 50-55%, normal left atrial size, normal right ventricular systolic pressures, mild TR, mild MR otherwise essentially normal study.   Notes indicate Holter monitor was performed for 24 hours August 21, 2010 joint average heart rate 71 beats per minute. Report suggests paroxysmal atrial fibrillation with primary rhythm being sinus rhythm with atrial flutter/fibrillation noted  ABIs today suggests possible occlusion of his left SFA, left ABI in the mild range 0.78  EKG today shows normal sinus rhythm with rate 56 beats per minute, T-wave abnormality in leads V6, 3, aVF   Outpatient Encounter Prescriptions as of 03/01/2013  Medication Sig Dispense Refill  . aspirin 81 MG tablet Take 81 mg by mouth daily.        Marland Kitchen atorvastatin (LIPITOR) 10 MG tablet Take 1 tablet (10 mg  total) by mouth daily.  90 tablet  3  . cholecalciferol (VITAMIN D) 1000 UNITS tablet Take 1,000 Units by mouth daily.      . dabigatran (PRADAXA) 150 MG CAPS Take 1 capsule (150 mg total) by mouth every 12 (twelve) hours.  60 capsule  4  . glucose blood (BAYER CONTOUR TEST) test strip Use one strip every time glucose levels are to be tested.   Dx. 250.00  100 each  12  . insulin aspart protamine-insulin aspart (NOVOLOG MIX 70/30 FLEXPEN) (70-30) 100 UNIT/ML injection Inject 10 Units into the skin 2 (two) times daily with a meal.  15 mL  3  . Insulin Pen Needle (NOVOFINE) 30G X 8 MM MISC Use one needle each time insulin is injected into the skin.  180 each  2  . lisinopril (PRINIVIL,ZESTRIL) 5 MG tablet Take 1 tablet (5 mg total) by mouth daily.  90 tablet  3  . metFORMIN (GLUCOPHAGE) 1000 MG tablet Take 1 tablet (1,000 mg total) by mouth 2 (two) times daily with a meal.  180 tablet  3  . metoprolol tartrate (LOPRESSOR) 25 MG tablet Take 1 tablet (25 mg total) by mouth 2 (two) times daily.  180 tablet  2  . MICROLET LANCETS MISC Please use a lancet each time glucose levels are tested. Pt test levels twice daily. Dx. 250.00  100 each  12  . Tamsulosin HCl (FLOMAX) 0.4 MG CAPS Take 1 capsule (0.4 mg total) by mouth daily.  90 capsule  3  . terazosin (HYTRIN) 2 MG capsule Take 1 capsule (2 mg  total) by mouth at bedtime.  90 capsule  3  . [DISCONTINUED] Multiple Vitamin (MULTIVITAMIN) tablet Take 1 tablet by mouth daily.         No facility-administered encounter medications on file as of 03/01/2013.    Review of Systems  Constitutional: Negative.   HENT: Negative.   Eyes: Negative.   Respiratory: Negative.   Cardiovascular: Negative.   Gastrointestinal: Negative.   Musculoskeletal: Negative.   Skin: Negative.   Neurological: Negative.   Psychiatric/Behavioral: Negative.   All other systems reviewed and are negative.   BP 102/60  Pulse 56  Ht 6' (1.829 m)  Wt 158 lb 4 oz (71.782 kg)   BMI 21.46 kg/m2  Physical Exam  Nursing note and vitals reviewed. Constitutional: He is oriented to person, place, and time. He appears well-developed and well-nourished.  HENT:  Head: Normocephalic.  Nose: Nose normal.  Mouth/Throat: Oropharynx is clear and moist.  Eyes: Conjunctivae are normal. Pupils are equal, round, and reactive to light.  Neck: Normal range of motion. Neck supple. No JVD present.  Cardiovascular: Normal rate, S1 normal, S2 normal and intact distal pulses.  An irregularly irregular rhythm present. Exam reveals no gallop and no friction rub.   Murmur heard.  Crescendo systolic murmur is present with a grade of 2/6  Pulmonary/Chest: Effort normal and breath sounds normal. No respiratory distress. He has no wheezes. He has no rales. He exhibits no tenderness.  Abdominal: Soft. Bowel sounds are normal. He exhibits no distension. There is no tenderness.  Musculoskeletal: Normal range of motion. He exhibits no edema and no tenderness.  Lymphadenopathy:    He has no cervical adenopathy.  Neurological: He is alert and oriented to person, place, and time. Coordination normal.  Skin: Skin is warm and dry. No rash noted. No erythema.  Psychiatric: He has a normal mood and affect. His behavior is normal. Judgment and thought content normal.      Assessment and Plan

## 2013-03-01 NOTE — Assessment & Plan Note (Signed)
Low ABI on the left. Arterial Doppler will be ordered.

## 2013-03-01 NOTE — Assessment & Plan Note (Signed)
Blood pressure is well controlled on today's visit. No changes made to the medications. 

## 2013-03-01 NOTE — Assessment & Plan Note (Signed)
Cholesterol is at goal on the current lipid regimen. No changes to the medications were made.  

## 2013-03-01 NOTE — Assessment & Plan Note (Signed)
We have encouraged continued exercise, careful diet management in an effort to lose weight. 

## 2013-03-21 ENCOUNTER — Ambulatory Visit (INDEPENDENT_AMBULATORY_CARE_PROVIDER_SITE_OTHER): Payer: Medicare Other | Admitting: Internal Medicine

## 2013-03-21 ENCOUNTER — Telehealth: Payer: Self-pay | Admitting: *Deleted

## 2013-03-21 VITALS — BP 108/56 | HR 59 | Temp 98.5°F | Resp 14 | Wt 153.8 lb

## 2013-03-21 DIAGNOSIS — I951 Orthostatic hypotension: Secondary | ICD-10-CM

## 2013-03-21 DIAGNOSIS — E119 Type 2 diabetes mellitus without complications: Secondary | ICD-10-CM

## 2013-03-21 DIAGNOSIS — I6350 Cerebral infarction due to unspecified occlusion or stenosis of unspecified cerebral artery: Secondary | ICD-10-CM

## 2013-03-21 DIAGNOSIS — I4891 Unspecified atrial fibrillation: Secondary | ICD-10-CM

## 2013-03-21 DIAGNOSIS — I739 Peripheral vascular disease, unspecified: Secondary | ICD-10-CM

## 2013-03-21 DIAGNOSIS — I639 Cerebral infarction, unspecified: Secondary | ICD-10-CM

## 2013-03-21 LAB — COMPREHENSIVE METABOLIC PANEL
BUN: 20 mg/dL (ref 6–23)
CO2: 28 mEq/L (ref 19–32)
Calcium: 9.7 mg/dL (ref 8.4–10.5)
Chloride: 102 mEq/L (ref 96–112)
Creatinine, Ser: 0.8 mg/dL (ref 0.4–1.5)
GFR: 106.27 mL/min (ref >60.00)
Glucose, Bld: 132 mg/dL — ABNORMAL HIGH (ref 70–99)

## 2013-03-21 NOTE — Telephone Encounter (Signed)
Pt would like for his lab work to be mailed to him

## 2013-03-21 NOTE — Progress Notes (Signed)
Patient ID: Chad Bailey, male   DOB: [birth year], 79 y.o.   MRN: [id number]    Patient Active Problem List   Diagnosis Date Noted  . Orthostasis 03/22/2013  . Peripheral vascular disease 03/01/2013  . CVA (cerebral vascular accident) 03/01/2013  . Type 2 diabetes mellitus with insulin therapy 04/14/2012  . History of tobacco abuse   . gouty arthritis   . HYPERLIPIDEMIA-MIXED 11/04/2010  . HYPERTENSION, BENIGN 11/04/2010  . ATRIAL FIBRILLATION 11/04/2010    Subjective:  CC:   Chief Complaint  Patient presents with  . Follow-up    has endocrinology visit for nodules on thyroid gland.    HPI:   Chad Bailey is a 79 y.o. male who presents 3 month follow up on chronic medical issues including PAD with history of CVA,  Retinal stroke, Atrial fibrillation,  DM, insulin requiring, and BPH.  He is undergoing a workup for thyroid nodules that were noted on recent carotid ultrasound an has been referred to [facility]. 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  PAD workup found  left leg occlusive arterial disease. [facility] managing workup for PAD with left SFA occlusion suspected,  Has symptoms of  Rest claudication   DM follow up:  evening BS are 112 to 179.  10 units of  70/30 bid ,  Adjusts evening dose if high.  To 14 units.  He feels generally well except he notes recurrent episodes of feeling dizzy and  light headed, especially in the heat.  He is taking terazosin for BPH       Past Medical History  Diagnosis Date  . Non-insulin dependent diabetes mellitus   . A-fib   . Hyperlipidemia   . Fracture of left ankle   . Hypertension   . History of tobacco abuse   . gouty arthritis   . Loss of vision 01/2013    left eye  . Thyroid nodule 2014  . Retinal artery thrombosis, left 2014    Past Surgical History  Procedure Laterality Date  . Left ankle   1996    s/p surgery with screws    The following portions of the patient's  history were reviewed and updated as appropriate: Allergies, current medications, and problem list.    Review of Systems:   Patient denies headache, fevers, malaise, unintentional weight loss, skin rash, eye pain, sinus congestion and sinus pain, sore throat, dysphagia,  hemoptysis , cough, dyspnea, wheezing, chest pain, palpitations, orthopnea, edema, abdominal pain, nausea, melena, diarrhea, constipation, flank pain, dysuria, hematuria, urinary  Frequency, nocturia, numbness, tingling, seizures,  Focal weakness, Loss of consciousness,  Tremor, insomnia, depression, anxiety, and suicidal ideation.     History   Social History  . Marital Status: Married    Spouse Name: N/A    Number of Children: N/A  . Years of Education: N/A   Occupational History  . Not on file.   Social History Main Topics  . Smoking status: Former Smoker -- 6.00 packs/day for 56 years    Types: Cigarettes, Pipe    Quit date: 10/05/2002  . Smokeless tobacco: Current User    Types: Chew     Comment: smoked 6 pipes/day;smoked cigarettes 1/2 PPD ,quit chewing tobacco in 2004  . Alcohol Use: No  . Drug Use: No  . Sexually Active: Not on file   Other Topics Concern  . Not on file   Social History Narrative  . No narrative on file  Objective:  BP 108/56  Pulse 59  Temp(Src) 98.5 F (36.9 C) (Oral)  Resp 14  Wt 153 lb 12 oz (69.741 kg)  BMI 20.85 kg/m2  SpO2 98%  General appearance: alert, cooperative and appears stated age Ears: normal TM's and external ear canals both ears Throat: lips, mucosa, and tongue normal; teeth and gums normal Neck: no adenopathy, no carotid bruit, supple, symmetrical, trachea midline and thyroid not enlarged, symmetric, no tenderness/mass/nodules Back: symmetric, no curvature. ROM normal. No CVA tenderness. Lungs: clear to auscultation bilaterally Heart: regular rate and rhythm, S1, S2 normal, no murmur, click, rub or gallop Abdomen: soft, non-tender; bowel sounds  normal; no masses,  no organomegaly Pulses: 2+ and symmetric Skin: Skin color, texture, turgor normal. No rashes or lesions Lymph nodes: Cervical, supraclavicular, and axillary nodes normal.  Assessment and Plan:  Peripheral vascular disease Has repeat eval next week at Dr Windell Hummingbird office for  dopper of left leg due to suspected occlusion of left SFA by prior ABIs.  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  Continue Pradaxa,  Asa, Statin, and DM management which is good.   ATRIAL FIBRILLATION Well controlled on current regimen of metoprolol ,  no changes today.  Type 2 diabetes mellitus with insulin therapy Well-controlled on current medications.  hemoglobin AC is 7.2 and he is 79 yrs old.  He is up-to-date on eye exams and his foot exam is up to date. al we'll repeat his urine micro albumin to creatinine ratio at next visit. He is on the appropriate medications.  No changes made but advised to reduce consumption of oyster crackers with evening meal and check a 3 am sugar to rule out rebound hyperglycemia as the cause for consistently elevated fasting's. .  Orthostasis Due to use of alpha blocker for BPH.   Will switch to finasteride   Updated Medication List Outpatient Encounter Prescriptions as of 03/21/2013  Medication Sig Dispense Refill  . aspirin 81 MG tablet Take 81 mg by mouth daily.        Marland Kitchen atorvastatin (LIPITOR) 10 MG tablet Take 1 tablet (10 mg total) by mouth daily.  90 tablet  3  . cholecalciferol (VITAMIN D) 1000 UNITS tablet Take 1,000 Units by mouth daily.      . dabigatran (PRADAXA) 150 MG CAPS Take 1 capsule (150 mg total) by mouth every 12 (twelve) hours.  60 capsule  4  . glucose blood (BAYER CONTOUR TEST) test strip Use one strip every time glucose levels are to be tested.   Dx. 250.00  100 each  12  .  insulin aspart protamine-insulin aspart (NOVOLOG MIX 70/30 FLEXPEN) (70-30) 100 UNIT/ML injection Inject 10 Units into the skin 2 (two) times daily with a meal.  15 mL  3  . Insulin Pen Needle (NOVOFINE) 30G X 8 MM MISC Use one needle each time insulin is injected into the skin.  180 each  2  . lisinopril (PRINIVIL,ZESTRIL) 5 MG tablet Take 1 tablet (5 mg total) by mouth daily.  90 tablet  3  . metFORMIN (GLUCOPHAGE) 1000 MG tablet Take 1 tablet (1,000 mg total) by mouth 2 (two) times daily with a meal.  180 tablet  3  . metoprolol tartrate (LOPRESSOR) 25 MG tablet Take 1 tablet (25 mg total) by mouth 2 (two) times daily.  180  tablet  2  . MICROLET LANCETS MISC Please use a lancet each time glucose levels are tested. Pt test levels twice daily. Dx. 250.00  100 each  12  . Tamsulosin HCl (FLOMAX) 0.4 MG CAPS Take 1 capsule (0.4 mg total) by mouth daily.  90 capsule  3  . terazosin (HYTRIN) 2 MG capsule Take 1 capsule (2 mg total) by mouth at bedtime.  90 capsule  3  . finasteride (PROSCAR) 5 MG tablet Take 1 tablet (5 mg total) by mouth daily.  30 tablet  3   No facility-administered encounter medications on file as of 03/21/2013.     Orders Placed This Encounter  Procedures  . Hemoglobin A1c  . Comprehensive metabolic panel  . HM DIABETES EYE EXAM    Return in about 3 months (around 06/21/2013).

## 2013-03-21 NOTE — Patient Instructions (Addendum)
Reduce the oyster crackers in  Your dinner salad to less than 20 carbohydrates   Check your 3 am blood sugar  a few times when yo get up to void and send me the numbers  Ok to continue taking a Boost daily ,  You can increase to 2 daily with a second one in between  meals    We will repeat your A1c today along with liver/kidney function   I will look into finding an alternative to your prostate medication because it may be causing your dizziness

## 2013-03-21 NOTE — Assessment & Plan Note (Addendum)
Has repeat eval next week at Dr Windell Hummingbird office for  dopper of left leg due to suspected occlusion of left SFA by prior ABIs.  Has rest pain but no pai with activity

## 2013-03-22 ENCOUNTER — Encounter: Payer: Self-pay | Admitting: Internal Medicine

## 2013-03-22 ENCOUNTER — Telehealth: Payer: Self-pay | Admitting: Internal Medicine

## 2013-03-22 DIAGNOSIS — I951 Orthostatic hypotension: Secondary | ICD-10-CM | POA: Insufficient documentation

## 2013-03-22 MED ORDER — FINASTERIDE 5 MG PO TABS: 5.0000 mg | ORAL_TABLET | Freq: Every day | ORAL | Status: DC

## 2013-03-22 NOTE — Assessment & Plan Note (Addendum)
Well-controlled on current medications.  hemoglobin AC is 7.2 and he is 79 yrs old.  He is up-to-date on eye exams and his foot exam is up to date. al we'll repeat his urine micro albumin to creatinine ratio at next visit. He is on the appropriate medications.  No changes made but advised to reduce consumption of oyster crackers with evening meal and check a 3 am sugar to rule out rebound hyperglycemia as the cause for consistently elevated fasting's. Marland Kitchen

## 2013-03-22 NOTE — Assessment & Plan Note (Addendum)
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  Continue Pradaxa,  Asa, Statin, and DM management which is good.

## 2013-03-22 NOTE — Telephone Encounter (Signed)
I sent in an alternative to the terazosin to his pharmacy .  It does not cause hypotension,  And will help shrink his prostate .  It's called finasteride and its taken once daily

## 2013-03-22 NOTE — Assessment & Plan Note (Addendum)
Due to use of alpha blocker for BPH.   Will switch to finasteride

## 2013-03-22 NOTE — Assessment & Plan Note (Signed)
Well controlled on current regimen of metoprolol .  , no changes today. 

## 2013-03-23 NOTE — Telephone Encounter (Signed)
Pt.notified

## 2013-03-28 ENCOUNTER — Other Ambulatory Visit: Payer: Self-pay | Admitting: Internal Medicine

## 2013-04-05 ENCOUNTER — Encounter (INDEPENDENT_AMBULATORY_CARE_PROVIDER_SITE_OTHER): Payer: Medicare Other

## 2013-04-05 DIAGNOSIS — I4891 Unspecified atrial fibrillation: Secondary | ICD-10-CM

## 2013-04-05 DIAGNOSIS — R0602 Shortness of breath: Secondary | ICD-10-CM

## 2013-04-05 DIAGNOSIS — I739 Peripheral vascular disease, unspecified: Secondary | ICD-10-CM

## 2013-04-14 ENCOUNTER — Other Ambulatory Visit: Payer: Self-pay | Admitting: Cardiovascular Disease

## 2013-04-14 NOTE — Telephone Encounter (Signed)
Refilled Pradaxa sent to [facility].

## 2013-04-14 NOTE — Telephone Encounter (Signed)
Results were reviewed by Dr Kirke Corin and pt was called with results.

## 2013-04-14 NOTE — Telephone Encounter (Signed)
Pt would like u/s results.  

## 2013-06-26 ENCOUNTER — Ambulatory Visit (INDEPENDENT_AMBULATORY_CARE_PROVIDER_SITE_OTHER): Payer: Medicare Other | Admitting: Internal Medicine

## 2013-06-26 ENCOUNTER — Encounter: Payer: Self-pay | Admitting: Internal Medicine

## 2013-06-26 VITALS — BP 102/64 | HR 64 | Temp 98.3°F | Resp 14 | Ht 72.0 in | Wt 152.2 lb

## 2013-06-26 DIAGNOSIS — E785 Hyperlipidemia, unspecified: Secondary | ICD-10-CM

## 2013-06-26 DIAGNOSIS — E119 Type 2 diabetes mellitus without complications: Secondary | ICD-10-CM

## 2013-06-26 DIAGNOSIS — E875 Hyperkalemia: Secondary | ICD-10-CM

## 2013-06-26 LAB — LIPID PANEL
HDL: 49 mg/dL (ref >39.00)
LDL Cholesterol: 57 mg/dL (ref 0–99)
Total CHOL/HDL Ratio: 2
VLDL: 15.8 mg/dL (ref 0.0–40.0)

## 2013-06-26 LAB — COMPREHENSIVE METABOLIC PANEL
BUN: 17 mg/dL (ref 6–23)
CO2: 28 mEq/L (ref 19–32)
Calcium: 9.3 mg/dL (ref 8.4–10.5)
Chloride: 102 mEq/L (ref 96–112)
Creatinine, Ser: 0.8 mg/dL (ref 0.4–1.5)
GFR: 95.81 mL/min (ref >60.00)
Glucose, Bld: 151 mg/dL — ABNORMAL HIGH (ref 70–99)
Total Bilirubin: 0.9 mg/dL (ref 0.3–1.2)

## 2013-06-26 LAB — HEMOGLOBIN A1C: Hgb A1c MFr Bld: 7.3 % — ABNORMAL HIGH (ref 4.6–6.5)

## 2013-06-26 LAB — HM DIABETES FOOT EXAM: HM Diabetic Foot Exam: NORMAL

## 2013-06-26 MED ORDER — TETANUS-DIPHTH-ACELL PERTUSSIS 5-2.5-18.5 LF-MCG/0.5 IM SUSP: 0.5000 mL | Freq: Once | INTRAMUSCULAR | Status: DC

## 2013-06-26 MED ORDER — ZOSTER VACCINE LIVE 19400 UNT/0.65ML ~~LOC~~ SOLR: 0.6500 mL | Freq: Once | SUBCUTANEOUS | Status: DC

## 2013-06-26 NOTE — Progress Notes (Signed)
Patient ID: Chad Bailey, male   DOB: 12-May-1930, 79 y.o.   MRN: [id number] Patient Active Problem List   Diagnosis Date Noted  . Orthostasis 03/22/2013  . Peripheral vascular disease 03/01/2013  . CVA (cerebral vascular accident) 03/01/2013  . Type 2 diabetes mellitus with insulin therapy 04/14/2012  . History of tobacco abuse   . gouty arthritis   . HYPERLIPIDEMIA-MIXED 11/04/2010  . HYPERTENSION, BENIGN 11/04/2010  . ATRIAL FIBRILLATION 11/04/2010    Subjective:  CC:   Chief Complaint  Patient presents with  . Follow-up    3 month    HPI:   Chad Muesingis a 79 y.o. male who presents  For  3 month follow up on hypertension, diabetes mellitus and CVD.  Accompanied by son who is visiting from out of town.  He brings his log of blood sugars.  chrcking twice daily,  fastng and mid afternoon/early evening.  He has been adjusting his 70/30  insulintwice daily doses from  from 14/14 to 14/10.  His fastings are < 125 50% of the time otherwise < 150.,  And evenings are 93 to 140 .  A1c is 7.2   Blood pressures have been checked ona daily basisi as well and 95% are < 130/80.  normal  needs new podiatrist     Past Medical History  Diagnosis Date  . Non-insulin dependent diabetes mellitus   . A-fib   . Hyperlipidemia   . Fracture of left ankle   . Hypertension   . History of tobacco abuse   . gouty arthritis   . Loss of vision 01/2013    left eye  . Thyroid nodule 2014  . Retinal artery thrombosis, left 2014    Past Surgical History  Procedure Laterality Date  . Left ankle   1996    s/p surgery with screws       The following portions of the patient's history were reviewed and updated as appropriate: Allergies, current medications, and problem list.    Review of Systems:   12 Pt  review of systems was negative except those addressed in the HPI,     History   Social History  . Marital Status: Married    Spouse Name: N/A    Number of Children: N/A  .  Years of Education: N/A   Occupational History  . Not on file.   Social History Main Topics  . Smoking status: Former Smoker -- 6.00 packs/day for 56 years    Types: Cigarettes, Pipe    Quit date: 10/05/2002  . Smokeless tobacco: Current User    Types: Chew     Comment: smoked 6 pipes/day;smoked cigarettes 1/2 PPD ,quit chewing tobacco in 2004  . Alcohol Use: No  . Drug Use: No  . Sexual Activity: Not on file   Other Topics Concern  . Not on file   Social History Narrative  . No narrative on file    Objective:  Filed Vitals:   06/26/13 1135  BP: 102/64  Pulse: 64  Temp: 98.3 F (36.8 C)  Resp: 14     General appearance: alert, cooperative and appears stated age Ears: normal TM's and external ear canals both ears Throat: lips, mucosa, and tongue normal; teeth and gums normal Neck: no adenopathy, no carotid bruit, supple, symmetrical, trachea midline and thyroid not enlarged, symmetric, no tenderness/mass/nodules Back: symmetric, no curvature. ROM normal. No CVA tenderness. Lungs: clear to auscultation bilaterally Heart: regular rate and rhythm, S1, S2 normal, no murmur, click, rub  or gallop Abdomen: soft, non-tender; bowel sounds normal; no masses,  no organomegaly Pulses: 2+ and symmetric Skin: Skin color, texture, turgor normal. No rashes or lesions Lymph nodes: Cervical, supraclavicular, and axillary nodes normal.  Assessment and Plan:  Type 2 diabetes mellitus with insulin therapy Well-controlled on current medications.  hemoglobin A1c has been consistently at or near  7.0 . He is up-to-date on eye exams and his foot exam is normal . l we'll repeat his urine microalbumin to creatinine ratio at next visit. He is on the appropriate medications.  HYPERLIPIDEMIA-MIXED LDL and triglycerides are at goal on current medications. He has no side effects and liver enzymes are normal. No changes today    Updated Medication List Outpatient Encounter Prescriptions as of  06/26/2013  Medication Sig Dispense Refill  . aspirin 81 MG tablet Take 81 mg by mouth daily.        Marland Kitchen atorvastatin (LIPITOR) 10 MG tablet Take 1 tablet (10 mg total) by mouth daily.  90 tablet  3  . cholecalciferol (VITAMIN D) 1000 UNITS tablet Take 1,000 Units by mouth daily.      . finasteride (PROSCAR) 5 MG tablet Take 1 tablet (5 mg total) by mouth daily.  30 tablet  3  . glucose blood (BAYER CONTOUR TEST) test strip Use one strip every time glucose levels are to be tested.   Dx. 250.00  100 each  12  . insulin aspart protamine-insulin aspart (NOVOLOG MIX 70/30 FLEXPEN) (70-30) 100 UNIT/ML injection Inject 10 Units into the skin 2 (two) times daily with a meal.  15 mL  3  . Insulin Pen Needle (NOVOFINE) 30G X 8 MM MISC Use one needle each time insulin is injected into the skin.  180 each  2  . lisinopril (PRINIVIL,ZESTRIL) 5 MG tablet Take 1 tablet (5 mg total) by mouth daily.  90 tablet  3  . metoprolol tartrate (LOPRESSOR) 25 MG tablet TAKE ONE TABLET BY MOUTH TWICE DAILY  180 tablet  1  . MICROLET LANCETS MISC Please use a lancet each time glucose levels are tested. Pt test levels twice daily. Dx. 250.00  100 each  12  . PRADAXA 150 MG CAPS TAKE ONE CAPSULE BY MOUTH EVERY 12 HOURS  60 capsule  3  . Tamsulosin HCl (FLOMAX) 0.4 MG CAPS Take 1 capsule (0.4 mg total) by mouth daily.  90 capsule  3  . TDaP (BOOSTRIX) 5-2.5-18.5 LF-MCG/0.5 injection Inject 0.5 mLs into the muscle once.  0.5 mL  0  . zoster vaccine live, PF, (ZOSTAVAX) 29528 UNT/0.65ML injection Inject 19,400 Units into the skin once.  1 each  0  . [DISCONTINUED] terazosin (HYTRIN) 2 MG capsule Take 1 capsule (2 mg total) by mouth at bedtime.  90 capsule  3   No facility-administered encounter medications on file as of 06/26/2013.     Orders Placed This Encounter  Procedures  . Lipid panel  . Hemoglobin A1c  . Microalbumin / creatinine urine ratio  . Comprehensive metabolic panel    No Follow-up on file.

## 2013-06-26 NOTE — Assessment & Plan Note (Signed)
LDL and triglycerides are at goal on current medications. He has no side effects and liver enzymes are normal. No changes today  

## 2013-06-26 NOTE — Assessment & Plan Note (Signed)
Well-controlled on current medications.  hemoglobin A1c has been consistently at or near  7.0 . He is up-to-date on eye exams and his foot exam is normal . l we'll repeat his urine microalbumin to creatinine ratio at next visit. He is on the appropriate medications.

## 2013-06-26 NOTE — Patient Instructions (Addendum)
   You need to have a TDaP vaccine and a Shingles vaccine.  I have given you prescriptions for these because they will be cheaper at the  your  local pharmacy because Medicare will not reimburse for them.   You need to have the new [place] vaccine when it is available in a month or two   We will contact you with the bloodwork results

## 2013-06-27 ENCOUNTER — Other Ambulatory Visit: Payer: Self-pay | Admitting: *Deleted

## 2013-06-27 MED ORDER — TAMSULOSIN HCL 0.4 MG PO CAPS: 0.4000 mg | ORAL_CAPSULE | Freq: Every day | ORAL | Status: DC

## 2013-06-27 MED ORDER — METFORMIN HCL 1000 MG PO TABS: 1000.0000 mg | ORAL_TABLET | Freq: Two times a day (BID) | ORAL | Status: DC

## 2013-06-27 NOTE — Telephone Encounter (Signed)
Refill Request  Metformin 1000mg  tab  #180  Take one tablet by mouth twice daily with meals

## 2013-06-28 NOTE — Addendum Note (Signed)
Addended by: Sherlene Shams on: 06/28/2013 06:58 AM   Modules accepted: Orders

## 2013-06-29 ENCOUNTER — Encounter: Payer: Self-pay | Admitting: *Deleted

## 2013-07-25 ENCOUNTER — Other Ambulatory Visit: Payer: Self-pay | Admitting: Internal Medicine

## 2013-08-14 ENCOUNTER — Other Ambulatory Visit: Payer: Self-pay | Admitting: Cardiovascular Disease

## 2013-08-14 NOTE — Telephone Encounter (Signed)
Requested Prescriptions   Signed Prescriptions Disp Refills  . atorvastatin (LIPITOR) 10 MG tablet 90 tablet 3    Sig: TAKE ONE TABLET BY MOUTH EVERY DAY    Authorizing Provider: Antonieta Iba    Ordering User: Maurilio Puryear C  . PRADAXA 150 MG CAPS capsule 60 capsule 3    Sig: TAKE ONE CAPSULE BY MOUTH EVERY 12 HOURS    Authorizing Provider: Antonieta Iba    Ordering User: Kendrick Fries

## 2013-08-29 ENCOUNTER — Ambulatory Visit (INDEPENDENT_AMBULATORY_CARE_PROVIDER_SITE_OTHER): Payer: Medicare Other | Admitting: Cardiovascular Disease

## 2013-08-29 ENCOUNTER — Encounter: Payer: Self-pay | Admitting: Cardiovascular Disease

## 2013-08-29 VITALS — BP 125/62 | HR 51 | Ht 70.0 in | Wt 153.0 lb

## 2013-08-29 DIAGNOSIS — I10 Essential (primary) hypertension: Secondary | ICD-10-CM

## 2013-08-29 DIAGNOSIS — E785 Hyperlipidemia, unspecified: Secondary | ICD-10-CM

## 2013-08-29 DIAGNOSIS — I4891 Unspecified atrial fibrillation: Secondary | ICD-10-CM

## 2013-08-29 DIAGNOSIS — I739 Peripheral vascular disease, unspecified: Secondary | ICD-10-CM

## 2013-08-29 DIAGNOSIS — E119 Type 2 diabetes mellitus without complications: Secondary | ICD-10-CM

## 2013-08-29 NOTE — Assessment & Plan Note (Signed)
Cholesterol is at goal on the current lipid regimen. No changes to the medications were made.  

## 2013-08-29 NOTE — Assessment & Plan Note (Signed)
Maintaining normal sinus rhythm on today's visit. He is having bradycardia. Recent fall. Occasional lightheadedness. We'll decrease the metoprolol in the morning down to 12.5 mg in the a.m., 25 mg in the p.m.

## 2013-08-29 NOTE — Assessment & Plan Note (Signed)
We will decrease metoprolol done in the morning, continue same dose in the evening, continue  lisinopril

## 2013-08-29 NOTE — Patient Instructions (Addendum)
You are doing well.  Please cut the metoprolol in 1/2 in the AM, full metoprolol in the evening Continue on the lisinopril one a day  Continue to monitor your blood pressure and heart rate  Please call us if you have new issues that need to be addressed before your next appt.  Your physician wants you to follow-up in: 6 months.  You will receive a reminder letter in the mail two months in advance. If you don't receive a letter, please call our office to schedule the follow-up appointment.

## 2013-08-29 NOTE — Progress Notes (Signed)
Patient ID: Chad Bailey, male    DOB: [birth year], 79 y.o.   MRN: [id number]  HPI Comments: 79 year old gentleman, patient of Dr. Darrick Huntsman, with a history of diabetes, hypertension, hyperlipidemia, long smoking history who stopped 10 years ago, Presenting initially in January 2012 with atrial fibrillation, started on anticoagulation who presents for routine followup.  Previous stroke with loss of vision in his left eye. seen by ophthalmology who  referred him to neurology.  had embolic plaque as a cause of his vision problem. He has occasional dizzy spells.    in atrial fibrillation in 2012, during an office visit.  He had been chopping wood the day before. He converted to normal sinus rhythm by the next visit to the office. No medications have been changed.   He does report a recent fall. He was leaning over to pick something up. He feels that he lost his balance, and landed on his chest after he fell forward. He was concerned about his chest discomfort and whether there may be some other cause of his fall. Balance is poor, he uses a cane.  Blood pressures today are typically well controlled though occasional very low blood pressure systolic of 100 otherwise typically 130s. Heart rates also recently well controlled though frequently in the 50s, occasional high 40s  He has been maintaining normal sinus rhythm on his last several clinic visits. Hemoglobin A1c 7.5 Total cholesterol 113, LDL 53  Old outside Echocardiogram suggested borderline normal ejection fraction of 50-55%, normal left atrial size, normal right ventricular systolic pressures, mild TR, mild MR otherwise essentially normal study.   Notes indicate Holter monitor was performed for 24 hours August 21, 2010 joint average heart rate 71 beats per minute. Report suggests paroxysmal atrial fibrillation with primary rhythm being sinus rhythm with atrial flutter/fibrillation noted  ABIs  suggests possible occlusion of his left SFA, left  ABI in the mild range 0.78  EKG today shows normal sinus rhythm with rate 51 beats per minute, T-wave abnormality in leads V6, 3, aVF   Outpatient Encounter Prescriptions as of 08/29/2013  Medication Sig  . aspirin 81 MG tablet Take 81 mg by mouth daily.    Marland Kitchen atorvastatin (LIPITOR) 10 MG tablet TAKE ONE TABLET BY MOUTH EVERY DAY  . cholecalciferol (VITAMIN D) 1000 UNITS tablet Take 1,000 Units by mouth daily.  . finasteride (PROSCAR) 5 MG tablet TAKE ONE TABLET BY MOUTH ONCE DAILY  . glucose blood (BAYER CONTOUR TEST) test strip Use one strip every time glucose levels are to be tested.   Dx. 250.00  . insulin aspart protamine-insulin aspart (NOVOLOG MIX 70/30 FLEXPEN) (70-30) 100 UNIT/ML injection Inject 10 Units into the skin 2 (two) times daily with a meal.  . Insulin Pen Needle (NOVOFINE) 30G X 8 MM MISC Use one needle each time insulin is injected into the skin.  Marland Kitchen lisinopril (PRINIVIL,ZESTRIL) 5 MG tablet Take 1 tablet (5 mg total) by mouth daily.  . metFORMIN (GLUCOPHAGE) 1000 MG tablet Take 1 tablet (1,000 mg total) by mouth 2 (two) times daily with a meal.  . metoprolol tartrate (LOPRESSOR) 25 MG tablet TAKE ONE TABLET BY MOUTH TWICE DAILY  . MICROLET LANCETS MISC Please use a lancet each time glucose levels are tested. Pt test levels twice daily. Dx. 250.00  . PRADAXA 150 MG CAPS capsule TAKE ONE CAPSULE BY MOUTH EVERY 12 HOURS  . tamsulosin (FLOMAX) 0.4 MG CAPS capsule Take 1 capsule (0.4 mg total) by mouth daily.  . TDaP (BOOSTRIX) 5-2.5-18.5  LF-MCG/0.5 injection Inject 0.5 mLs into the muscle once.  . zoster vaccine live, PF, (ZOSTAVAX) 40981 UNT/0.65ML injection Inject 19,400 Units into the skin once.    Review of Systems  Constitutional: Negative.   HENT: Negative.   Eyes: Negative.   Respiratory: Negative.   Cardiovascular: Negative.   Gastrointestinal: Negative.   Endocrine: Negative.   Musculoskeletal: Negative.   Skin: Negative.   Allergic/Immunologic: Negative.    Neurological: Negative.   Hematological: Negative.   Psychiatric/Behavioral: Negative.   All other systems reviewed and are negative.   BP 120/62  Pulse 51  Ht 5\' 10"  (1.778 m)  Wt 153 lb (69.4 kg)  BMI 21.95 kg/m2  Physical Exam  Nursing note and vitals reviewed. Constitutional: He is oriented to person, place, and time. He appears well-developed and well-nourished.  HENT:  Head: Normocephalic.  Nose: Nose normal.  Mouth/Throat: Oropharynx is clear and moist.  Eyes: Conjunctivae are normal. Pupils are equal, round, and reactive to light.  Neck: Normal range of motion. Neck supple. No JVD present.  Cardiovascular: Normal rate, S1 normal, S2 normal and intact distal pulses.  A regularly irregular rhythm present. Exam reveals no gallop and no friction rub.   Murmur heard.  Crescendo systolic murmur is present with a grade of 2/6  Pulmonary/Chest: Effort normal and breath sounds normal. No respiratory distress. He has no wheezes. He has no rales. He exhibits no tenderness.  Abdominal: Soft. Bowel sounds are normal. He exhibits no distension. There is no tenderness.  Musculoskeletal: Normal range of motion. He exhibits no edema and no tenderness.  Lymphadenopathy:    He has no cervical adenopathy.  Neurological: He is alert and oriented to person, place, and time. Coordination normal.  Skin: Skin is warm and dry. No rash noted. No erythema.  Psychiatric: He has a normal mood and affect. His behavior is normal. Judgment and thought content normal.      Assessment and Plan

## 2013-08-29 NOTE — Assessment & Plan Note (Signed)
We have encouraged continued exercise, careful diet management in an effort to lose weight. 

## 2013-09-05 ENCOUNTER — Telehealth: Payer: Self-pay | Admitting: Internal Medicine

## 2013-09-05 MED ORDER — GLUCOSE BLOOD VI STRP: ORAL_STRIP | Status: DC

## 2013-09-05 NOTE — Telephone Encounter (Signed)
Rx sent to pharmacy by escript. Pt's wife notified Rx was sent

## 2013-09-05 NOTE — Telephone Encounter (Signed)
Pt got a new meter and is needing new strips. They are called Contour Next EZ. He is needing that called into [facility] in [place].

## 2013-09-25 ENCOUNTER — Other Ambulatory Visit: Payer: Self-pay

## 2013-09-25 ENCOUNTER — Other Ambulatory Visit: Payer: Self-pay | Admitting: Internal Medicine

## 2013-09-25 MED ORDER — METOPROLOL TARTRATE 25 MG PO TABS: ORAL_TABLET | ORAL | Status: DC

## 2013-09-25 NOTE — Telephone Encounter (Signed)
Requested Prescriptions   Signed Prescriptions Disp Refills  . metoprolol tartrate (LOPRESSOR) 25 MG tablet 135 tablet 3    Sig: Take 1/2 tablet am and 1 tablet in the evening daily.    Authorizing Provider: Antonieta Iba    Ordering User: Kendrick Fries

## 2013-10-02 ENCOUNTER — Ambulatory Visit: Payer: Medicare Other | Admitting: Internal Medicine

## 2013-10-19 ENCOUNTER — Other Ambulatory Visit: Payer: Self-pay | Admitting: Internal Medicine

## 2013-10-20 ENCOUNTER — Ambulatory Visit (INDEPENDENT_AMBULATORY_CARE_PROVIDER_SITE_OTHER): Payer: Medicare Other | Admitting: Internal Medicine

## 2013-10-20 ENCOUNTER — Encounter: Payer: Self-pay | Admitting: Internal Medicine

## 2013-10-20 VITALS — BP 126/62 | HR 54 | Temp 97.6°F | Resp 16 | Wt 157.0 lb

## 2013-10-20 DIAGNOSIS — E1059 Type 1 diabetes mellitus with other circulatory complications: Secondary | ICD-10-CM

## 2013-10-20 DIAGNOSIS — E119 Type 2 diabetes mellitus without complications: Secondary | ICD-10-CM

## 2013-10-20 DIAGNOSIS — I10 Essential (primary) hypertension: Secondary | ICD-10-CM

## 2013-10-20 DIAGNOSIS — I4891 Unspecified atrial fibrillation: Secondary | ICD-10-CM

## 2013-10-20 DIAGNOSIS — Z794 Long term (current) use of insulin: Secondary | ICD-10-CM

## 2013-10-20 DIAGNOSIS — Z1211 Encounter for screening for malignant neoplasm of colon: Secondary | ICD-10-CM

## 2013-10-20 LAB — COMPREHENSIVE METABOLIC PANEL
ALK PHOS: 52 U/L (ref 39–117)
ALT: 15 U/L (ref 0–53)
AST: 16 U/L (ref 0–37)
Albumin: 3.7 g/dL (ref 3.5–5.2)
BILIRUBIN TOTAL: 0.8 mg/dL (ref 0.3–1.2)
BUN: 15 mg/dL (ref 6–23)
CO2: 29 mEq/L (ref 19–32)
CREATININE: 0.6 mg/dL (ref 0.4–1.5)
Calcium: 9.1 mg/dL (ref 8.4–10.5)
Chloride: 101 mEq/L (ref 96–112)
GFR: 134.69 mL/min (ref >60.00)
Glucose, Bld: 116 mg/dL — ABNORMAL HIGH (ref 70–99)
Potassium: 4.8 mEq/L (ref 3.5–5.1)
Sodium: 137 mEq/L (ref 135–145)
Total Protein: 6.2 g/dL (ref 6.0–8.3)

## 2013-10-20 LAB — LIPID PANEL
CHOL/HDL RATIO: 3
Cholesterol: 128 mg/dL (ref 0–200)
HDL: 47.1 mg/dL (ref >39.00)
LDL CALC: 68 mg/dL (ref 0–99)
Triglycerides: 67 mg/dL (ref 0.0–149.0)
VLDL: 13.4 mg/dL (ref 0.0–40.0)

## 2013-10-20 LAB — HEMOGLOBIN A1C: HEMOGLOBIN A1C: 7 % — AB (ref 4.6–6.5)

## 2013-10-20 LAB — HM DIABETES FOOT EXAM: HM Diabetic Foot Exam: NORMAL

## 2013-10-20 NOTE — Progress Notes (Signed)
Patient ID: Chad Bailey, male   DOB: 11/29/1929, 80 y.o.   MRN: [id number]  Patient Active Problem List   Diagnosis Date Noted  . Orthostasis 03/22/2013  . Peripheral vascular disease 03/01/2013  . CVA (cerebral vascular accident) 03/01/2013  . Type 2 diabetes mellitus with insulin therapy 04/14/2012  . History of tobacco abuse   . gouty arthritis   . HYPERLIPIDEMIA-MIXED 11/04/2010  . HYPERTENSION, BENIGN 11/04/2010  . ATRIAL FIBRILLATION 11/04/2010    Subjective:  CC:   Chief Complaint  Patient presents with  . Follow-up    HPI:   Chad BeckersWalter Muesingis a 80 y.o. male who presents Follow up on hypertension coronary artery disease peripheral vascular disease and type 2 diabetes mellitus managed with insulin. He has had some elevations recently in his blood sugar he is due to dietary indiscretions. He has been eating [place] ice cream several times a week. He notes on those occasions that his readings are over 200. He has had no hypoglycemic events. He is not exercising. Diet reviewed.  Typical breakfast is Peanut butter and sugar free jam on wheat bread .   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 He has been taking his Pradaxa for anticoagulation and follows up regularly with his urologist  Chad Bailey    Past Medical History  Diagnosis Date  . Non-insulin dependent diabetes mellitus   . A-fib   . Hyperlipidemia   . Fracture of left ankle   . Hypertension   . History of tobacco abuse   . gouty arthritis   . Loss of vision 01/2013    left eye  . Thyroid nodule 2014  . Retinal artery thrombosis, left 2014    Past Surgical History  Procedure Laterality Date  . Left ankle   1996    s/p surgery with screws       The following portions of the patient's history were reviewed and updated as appropriate: Allergies, current medications, and problem list.    Review of Systems:   12 Pt  review of systems was  negative except those addressed in the HPI,     History   Social History  . Marital Status: Married    Spouse Name: N/A    Number of Children: N/A  . Years of Education: N/A   Occupational History  . Not on file.   Social History Main Topics  . Smoking status: Former Smoker -- 6.00 packs/day for 56 years    Types: Cigarettes, Pipe    Quit date: 10/05/2002  . Smokeless tobacco: Current User    Types: Chew     Comment: smoked 6 pipes/day;smoked cigarettes 1/2 PPD ,quit chewing tobacco in 2004  . Alcohol Use: No  . Drug Use: No  . Sexual Activity: Not on file   Other Topics Concern  . Not on file   Social History Narrative  . No narrative on file    Objective:  Filed Vitals:   10/20/13 1125  BP: 126/62  Pulse: 54  Temp: 97.6 F (36.4 C)  Resp: 16     General appearance: alert, cooperative and appears stated age Ears: normal TM's and external ear canals both ears Throat: lips, mucosa, and tongue normal; teeth and gums normal Neck: no adenopathy, no carotid bruit, supple, symmetrical, trachea midline and thyroid not enlarged, symmetric, no tenderness/mass/nodules Back: symmetric, no curvature. ROM normal. No CVA tenderness. Lungs: clear to auscultation bilaterally Heart:  regular rate and rhythm, S1, S2 normal, no murmur, click, rub or gallop Abdomen: soft, non-tender; bowel sounds normal; no masses,  no organomegaly Pulses: 2+ and symmetric Skin: Skin color, texture, turgor normal. No rashes or lesions Lymph nodes: Cervical, supraclavicular, and axillary nodes normal.  Assessment and Plan:  ATRIAL FIBRILLATION Well controlled on current regimen., no changes today.  HYPERTENSION, BENIGN Well controlled on current regimen. Renal function stable, no changes today.  Lab Results  Component Value Date   CREATININE 0.6 10/20/2013    Lab Results  Component Value Date   NA 137 10/20/2013   K 4.8 10/20/2013   CL 101 10/20/2013   CO2 29 10/20/2013     Type 2  diabetes mellitus with insulin therapy Well-controlled on current medications.  hemoglobin A1c has been consistently at or near  7.0 . He is up-to-date on eye exams and his foot exam is normal . l we'll repeat his urine microalbumin to creatinine ratio at next visit. He is on the appropriate medications.  Lab Results  Component Value Date   HGBA1C 7.0* 10/20/2013   Lab Results  Component Value Date   MICROALBUR 2.6* 06/26/2013      Updated Medication List Outpatient Encounter Prescriptions as of 10/20/2013  Medication Sig  . aspirin 81 MG tablet Take 81 mg by mouth daily.    Marland Kitchen atorvastatin (LIPITOR) 10 MG tablet TAKE ONE TABLET BY MOUTH EVERY DAY  . cholecalciferol (VITAMIN D) 1000 UNITS tablet Take 1,000 Units by mouth daily.  . finasteride (PROSCAR) 5 MG tablet TAKE ONE TABLET BY MOUTH ONCE DAILY  . glucose blood test strip Check blood sugar twice daily as instructed. Pt has Contour Next EZ. Dx 250.00  . insulin aspart protamine-insulin aspart (NOVOLOG MIX 70/30 FLEXPEN) (70-30) 100 UNIT/ML injection Inject 10 Units into the skin 2 (two) times daily with a meal.  . lisinopril (PRINIVIL,ZESTRIL) 5 MG tablet Take 1 tablet (5 mg total) by mouth daily.  . metFORMIN (GLUCOPHAGE) 1000 MG tablet Take 1 tablet (1,000 mg total) by mouth 2 (two) times daily with a meal.  . metoprolol tartrate (LOPRESSOR) 25 MG tablet TAKE ONE TABLET BY MOUTH TWICE DAILY  . metoprolol tartrate (LOPRESSOR) 25 MG tablet Take 1/2 tablet am and 1 tablet in the evening daily.  Marland Kitchen MICROLET LANCETS MISC Please use a lancet each time glucose levels are tested. Pt test levels twice daily. Dx. 250.00  . NOVOFINE 30G X 8 MM MISC USE 1 NEEDLE EACH TIME INSULINS IS INJECTED INTO THE SKIN  . PRADAXA 150 MG CAPS capsule TAKE ONE CAPSULE BY MOUTH EVERY 12 HOURS  . tamsulosin (FLOMAX) 0.4 MG CAPS capsule Take 1 capsule (0.4 mg total) by mouth daily.  . TDaP (BOOSTRIX) 5-2.5-18.5 LF-MCG/0.5 injection Inject 0.5 mLs into the muscle  once.  . zoster vaccine live, PF, (ZOSTAVAX) 16109 UNT/0.65ML injection Inject 19,400 Units into the skin once.     Orders Placed This Encounter  Procedures  . Fecal occult blood, imunochemical  . Fecal Occult Blood, Guaiac  . Lipid panel  . Hemoglobin A1c  . Comprehensive metabolic panel  . Ambulatory referral to Podiatry  . HM DIABETES FOOT EXAM    No Follow-up on file.

## 2013-10-20 NOTE — Patient Instructions (Signed)
You can continue your usual breakfast and peanut butter, jam and toast  Check your blood sugar 2 hours after your breakfast.  If the reading is > 150 but < 180,  Increase your morning dose of insulin by 1 unit If it is > 180,  Increase your morning dose by 2 units  I will refer you to a podiatrist in [place] to take care of your feet.  Return in 3 months.  You do not need to fast for this appt

## 2013-10-20 NOTE — Progress Notes (Signed)
Pre-visit discussion using our clinic review tool. No additional management support is needed unless otherwise documented below in the visit note.  

## 2013-10-22 NOTE — Assessment & Plan Note (Signed)
Well controlled on current regimen. Renal function stable, no changes today.  Lab Results  Component Value Date   CREATININE 0.6 10/20/2013    Lab Results  Component Value Date   NA 137 10/20/2013   K 4.8 10/20/2013   CL 101 10/20/2013   CO2 29 10/20/2013

## 2013-10-22 NOTE — Assessment & Plan Note (Signed)
Well controlled on current regimen, no changes today. 

## 2013-10-22 NOTE — Assessment & Plan Note (Signed)
Well-controlled on current medications.  hemoglobin A1c has been consistently at or near  7.0 . He is up-to-date on eye exams and his foot exam is normal . l we'll repeat his urine microalbumin to creatinine ratio at next visit. He is on the appropriate medications.  Lab Results  Component Value Date   HGBA1C 7.0* 10/20/2013   Lab Results  Component Value Date   MICROALBUR 2.6* 06/26/2013

## 2013-10-24 ENCOUNTER — Encounter: Payer: Self-pay | Admitting: *Deleted

## 2013-10-24 ENCOUNTER — Telehealth: Payer: Self-pay

## 2013-10-24 NOTE — Telephone Encounter (Signed)
Relevant patient education mailed to patient.  

## 2013-12-12 ENCOUNTER — Other Ambulatory Visit: Payer: Self-pay | Admitting: Internal Medicine

## 2013-12-16 ENCOUNTER — Other Ambulatory Visit: Payer: Self-pay | Admitting: Cardiovascular Disease

## 2013-12-16 ENCOUNTER — Other Ambulatory Visit: Payer: Self-pay | Admitting: Internal Medicine

## 2013-12-21 ENCOUNTER — Other Ambulatory Visit: Payer: Self-pay | Admitting: Internal Medicine

## 2013-12-26 ENCOUNTER — Other Ambulatory Visit: Payer: Self-pay | Admitting: Internal Medicine

## 2014-01-03 MED ORDER — MICROLET LANCETS MISC: Status: DC

## 2014-01-03 NOTE — Addendum Note (Signed)
Addended by: Dennie BibleAVIS, Daryl Quiros R on: 01/03/2014 05:23 PM   Modules accepted: Orders

## 2014-01-16 ENCOUNTER — Other Ambulatory Visit: Payer: Self-pay | Admitting: Cardiovascular Disease

## 2014-01-17 ENCOUNTER — Other Ambulatory Visit: Payer: Self-pay

## 2014-01-17 MED ORDER — DABIGATRAN ETEXILATE MESYLATE 150 MG PO CAPS: ORAL_CAPSULE | ORAL | Status: DC

## 2014-01-23 ENCOUNTER — Encounter: Payer: Self-pay | Admitting: Cardiovascular Disease

## 2014-01-23 ENCOUNTER — Ambulatory Visit (INDEPENDENT_AMBULATORY_CARE_PROVIDER_SITE_OTHER): Payer: Medicare Other | Admitting: Cardiovascular Disease

## 2014-01-23 VITALS — BP 110/60 | Ht 69.5 in | Wt 155.2 lb

## 2014-01-23 DIAGNOSIS — E785 Hyperlipidemia, unspecified: Secondary | ICD-10-CM

## 2014-01-23 DIAGNOSIS — Z794 Long term (current) use of insulin: Secondary | ICD-10-CM

## 2014-01-23 DIAGNOSIS — E119 Type 2 diabetes mellitus without complications: Secondary | ICD-10-CM

## 2014-01-23 DIAGNOSIS — I4891 Unspecified atrial fibrillation: Secondary | ICD-10-CM

## 2014-01-23 DIAGNOSIS — I10 Essential (primary) hypertension: Secondary | ICD-10-CM

## 2014-01-23 MED ORDER — LISINOPRIL 10 MG PO TABS: 10.0000 mg | ORAL_TABLET | Freq: Every day | ORAL | Status: DC

## 2014-01-23 NOTE — Progress Notes (Signed)
Patient ID: Chad Bailey, male    DOB: 01/17/1930, 80 y.o.   MRN: [id number]  HPI Comments: 80 year old gentleman, patient of Dr. Darrick Huntsmanullo, with a history of diabetes, hypertension, hyperlipidemia, long smoking history who stopped 10 years ago, Presenting initially in January 2012 with atrial fibrillation, started on anticoagulation who presents for routine followup.  Previous stroke with loss of vision in his left eye. seen by ophthalmology who  referred him to neurology.  had embolic plaque as a cause of his vision problem. He has occasional dizzy spells.    in atrial fibrillation in 2012, during an office visit.  He had been chopping wood the day before. He converted to normal sinus rhythm by the next visit to the office. No medications have been changed.   He reports having a prior fall fall. He was leaning over to pick something up. . Balance is poor, he uses a cane. No recent falls on today's visit Blood pressures today are typically well controlled. Typically running in the 120-140 range, rare 150 systolic Heart rates  in the 50s He has been maintaining normal sinus rhythm on his last several clinic visits. Hemoglobin A1c 7.5 Total cholesterol 113, LDL 53  Old outside Echocardiogram suggested borderline normal ejection fraction of 50-55%, normal left atrial size, normal right ventricular systolic pressures, mild TR, mild MR otherwise essentially normal study.   Notes indicate Holter monitor was performed for 24 hours August 21, 2010 joint average heart rate 71 beats per minute. Report suggests paroxysmal atrial fibrillation with primary rhythm being sinus rhythm with atrial flutter/fibrillation noted  ABIs  suggests possible occlusion of his left SFA, left ABI in the mild range 0.78  EKG today shows normal sinus rhythm with rate 61 beats per minute, no significant ST or T wave changes, APCs noted   Outpatient Encounter Prescriptions as of 01/23/2014  Medication Sig  . aspirin 81 MG  tablet Take 81 mg by mouth daily.    Marland Kitchen. atorvastatin (LIPITOR) 10 MG tablet TAKE ONE TABLET BY MOUTH EVERY DAY  . cholecalciferol (VITAMIN D) 1000 UNITS tablet Take 1,000 Units by mouth daily.  . dabigatran (PRADAXA) 150 MG CAPS capsule TAKE ONE CAPSULE BY MOUTH EVERY 12 HOURS  . finasteride (PROSCAR) 5 MG tablet TAKE ONE TABLET BY MOUTH ONCE DAILY  . glucose blood test strip Check blood sugar twice daily as instructed. Pt has Contour Next EZ. Dx 250.00  . Insulin Aspart Prot & Aspart (NOVOLOG MIX 70/30 FLEXPEN) (70-30) 100 UNIT/ML Pen INJECT 14 UNITS INTO THE SKIN TWICE DAILY WITH A MEAL.  Marland Kitchen. lisinopril (PRINIVIL,ZESTRIL) 5 MG tablet TAKE ONE TABLET BY MOUTH ONCE DAILY  . metFORMIN (GLUCOPHAGE) 1000 MG tablet TAKE ONE TABLET BY MOUTH TWICE DAILY WITH A MEAL  . metoprolol tartrate (LOPRESSOR) 25 MG tablet Take 1/2 tablet am and 1 tablet in the evening daily.  Marland Kitchen. MICROLET LANCETS MISC USE ONE LANCET TWICE DAILY TO CHECK GLUCOSE LEVELS  . NOVOFINE 30G X 8 MM MISC USE 1 NEEDLE EACH TIME INSULINS IS INJECTED INTO THE SKIN  . tamsulosin (FLOMAX) 0.4 MG CAPS capsule TAKE ONE CAPSULE BY MOUTH ONCE DAILY  . TDaP (BOOSTRIX) 5-2.5-18.5 LF-MCG/0.5 injection Inject 0.5 mLs into the muscle once.  . zoster vaccine live, PF, (ZOSTAVAX) [id number] UNT/0.65ML injection Inject 19,400 Units into the skin once.   Review of Systems  Constitutional: Negative.   HENT: Negative.   Eyes: Negative.   Respiratory: Negative.   Cardiovascular: Negative.   Gastrointestinal: Negative.   Endocrine:  Negative.   Musculoskeletal: Negative.   Skin: Negative.   Allergic/Immunologic: Negative.   Neurological: Negative.   Hematological: Negative.   Psychiatric/Behavioral: Negative.   All other systems reviewed and are negative.  BP 110/60  Ht 5' 9.5" (1.765 m)  Wt 155 lb 4 oz (70.421 kg)  BMI 22.61 kg/m2  Physical Exam  Nursing note and vitals reviewed. Constitutional: He is oriented to person, place, and time. He appears  well-developed and well-nourished.  HENT:  Head: Normocephalic.  Nose: Nose normal.  Mouth/Throat: Oropharynx is clear and moist.  Eyes: Conjunctivae are normal. Pupils are equal, round, and reactive to light.  Neck: Normal range of motion. Neck supple. No JVD present.  Cardiovascular: Normal rate, S1 normal, S2 normal and intact distal pulses.  A regularly irregular rhythm present. Exam reveals no gallop and no friction rub.   Murmur heard.  Crescendo systolic murmur is present with a grade of 2/6  Pulmonary/Chest: Effort normal and breath sounds normal. No respiratory distress. He has no wheezes. He has no rales. He exhibits no tenderness.  Abdominal: Soft. Bowel sounds are normal. He exhibits no distension. There is no tenderness.  Musculoskeletal: Normal range of motion. He exhibits no edema and no tenderness.  Lymphadenopathy:    He has no cervical adenopathy.  Neurological: He is alert and oriented to person, place, and time. Coordination normal.  Skin: Skin is warm and dry. No rash noted. No erythema.  Psychiatric: He has a normal mood and affect. His behavior is normal. Judgment and thought content normal.      Assessment and Plan

## 2014-01-23 NOTE — Assessment & Plan Note (Signed)
Cholesterol is at goal on the current lipid regimen. No changes to the medications were made.  

## 2014-01-23 NOTE — Assessment & Plan Note (Signed)
We have encouraged continued exercise, careful diet management   

## 2014-01-23 NOTE — Patient Instructions (Addendum)
You are doing well. Please decrease the metoprolol to 1/2 pill twice a day  If your blood pressure runs high on a regular basis Call the office We would increase the lisinopril up to 10 mg once a day  Please call us if you have new issues that need to be addressed before your next appt.  Your physician wants you to follow-up in: 6 months.  You will receive a reminder letter in the mail two months in advance. If you don't receive a letter, please call our office to schedule the follow-up appointment.

## 2014-01-23 NOTE — Assessment & Plan Note (Signed)
Heart rate is slow. We will decrease his metoprolol down to 12.5 mg twice a day. If blood pressure climbs at home, lisinopril could be increased up to 10 mg daily. Higher dose prescription has been sent in of the lisinopril and he will cut this in half for now

## 2014-01-23 NOTE — Assessment & Plan Note (Signed)
No recent episodes of atrial fibrillation. We'll continue on anticoagulation for now. No recent falls

## 2014-01-26 ENCOUNTER — Other Ambulatory Visit: Payer: Self-pay | Admitting: Internal Medicine

## 2014-01-26 ENCOUNTER — Ambulatory Visit (INDEPENDENT_AMBULATORY_CARE_PROVIDER_SITE_OTHER): Payer: Medicare Other | Admitting: Internal Medicine

## 2014-01-26 ENCOUNTER — Encounter: Payer: Self-pay | Admitting: Internal Medicine

## 2014-01-26 VITALS — BP 152/68 | HR 51 | Temp 97.8°F | Resp 14 | Wt 157.2 lb

## 2014-01-26 DIAGNOSIS — E1165 Type 2 diabetes mellitus with hyperglycemia: Secondary | ICD-10-CM

## 2014-01-26 DIAGNOSIS — I10 Essential (primary) hypertension: Secondary | ICD-10-CM

## 2014-01-26 DIAGNOSIS — Z794 Long term (current) use of insulin: Secondary | ICD-10-CM

## 2014-01-26 DIAGNOSIS — E1129 Type 2 diabetes mellitus with other diabetic kidney complication: Secondary | ICD-10-CM

## 2014-01-26 DIAGNOSIS — I4891 Unspecified atrial fibrillation: Secondary | ICD-10-CM

## 2014-01-26 DIAGNOSIS — E785 Hyperlipidemia, unspecified: Secondary | ICD-10-CM

## 2014-01-26 DIAGNOSIS — E119 Type 2 diabetes mellitus without complications: Secondary | ICD-10-CM

## 2014-01-26 DIAGNOSIS — I739 Peripheral vascular disease, unspecified: Secondary | ICD-10-CM

## 2014-01-26 LAB — COMPREHENSIVE METABOLIC PANEL
ALT: 13 U/L (ref 0–53)
AST: 17 U/L (ref 0–37)
Albumin: 3.8 g/dL (ref 3.5–5.2)
Alkaline Phosphatase: 41 U/L (ref 39–117)
BUN: 14 mg/dL (ref 6–23)
CALCIUM: 9.5 mg/dL (ref 8.4–10.5)
CHLORIDE: 105 meq/L (ref 96–112)
CO2: 25 meq/L (ref 19–32)
Creatinine, Ser: 0.7 mg/dL (ref 0.4–1.5)
GFR: 111.16 mL/min (ref >60.00)
Glucose, Bld: 117 mg/dL — ABNORMAL HIGH (ref 70–99)
POTASSIUM: 4.8 meq/L (ref 3.5–5.1)
Sodium: 139 mEq/L (ref 135–145)
Total Bilirubin: 0.7 mg/dL (ref 0.3–1.2)
Total Protein: 6.1 g/dL (ref 6.0–8.3)

## 2014-01-26 LAB — MICROALBUMIN / CREATININE URINE RATIO
Creatinine,U: 124.9 mg/dL
Microalb Creat Ratio: 1.8 mg/g (ref 0.0–30.0)
Microalb, Ur: 2.2 mg/dL — ABNORMAL HIGH (ref 0.0–1.9)

## 2014-01-26 LAB — HEMOGLOBIN A1C: Hgb A1c MFr Bld: 7 % — ABNORMAL HIGH (ref 4.6–6.5)

## 2014-01-26 LAB — HM DIABETES EYE EXAM

## 2014-01-26 LAB — HM DIABETES FOOT EXAM: HM Diabetic Foot Exam: NORMAL

## 2014-01-26 NOTE — Assessment & Plan Note (Signed)
Elevated today.  Dr Mariah MillingGollan is adjusting metoprolol to 1/2 pill bid and recommended increasing lisinopril to 10 mg prn.  Yesterdays's bp was 140/79

## 2014-01-26 NOTE — Progress Notes (Signed)
Pre-visit discussion using our clinic review tool. No additional management support is needed unless otherwise documented below in the visit note.  

## 2014-01-26 NOTE — Patient Instructions (Addendum)
Your morning sugars are too high.  Please Increase you evening insulin by 2 units to total of 16 units  Continue 14 units in the morning   Your blood pressure is elevated today. I agree with Dr Mariah MillingGollan that you should increase your lisinopril to 10 mg daily if your home readings are persistently > 140/80 on the lower metoprolol dose

## 2014-01-26 NOTE — Progress Notes (Signed)
Patient ID: Chad Bailey, male   DOB: 07/08/1930, 80 y.o.   MRN: [id number]  Patient Active Problem List   Diagnosis Date Noted  . Orthostasis 03/22/2013  . Peripheral vascular disease 03/01/2013  . CVA (cerebral vascular accident) 03/01/2013  . Type 2 diabetes mellitus with insulin therapy 04/14/2012  . History of tobacco abuse   . gouty arthritis   . HYPERLIPIDEMIA-MIXED 11/04/2010  . HYPERTENSION, BENIGN 11/04/2010  . Atrial fibrillation 11/04/2010    Subjective:  CC:   Chief Complaint  Patient presents with  . Follow-up    HPI:   Chad Bailey is a 80 y.o. male who presents for follow up on  hypertension , coronary artery disease,  peripheral vascular disease,  and type 2 diabetes mellitus managed with insulin. He has had some elevations recently in his blood sugar he is due to dietary indiscretions. He has been eating [place] ice cream several times a week. He notes on those occasions that his readings are over 200. He has had no hypoglycemic events. He is not exercising. Diet reviewed.  Typical breakfast is Peanut butter and sugar free jam on wheat bread .   Lab Results  Component Value Date   HGBA1C 7.0* 10/20/2013   For the last month his evening post prandial sugars have been < 150 , but his fastings have been > 125 and < 200.    Supper has been mostly vegetables and chicken,  [employer].  Still snacking on sugar free ice cream most nights.   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 He has been taking his Pradaxa for anticoagulation and follows up regularly with his urologist  Appenzeller   Using a cane,  No falls,  Walking daily about 1/10 mile  At least walking his samll dog (20 lbs)      Past Medical History  Diagnosis Date  . Non-insulin dependent diabetes mellitus   . A-fib   . Hyperlipidemia   . Fracture of left ankle   . Hypertension   . History of tobacco abuse   . gouty arthritis   .  Loss of vision 01/2013    left eye  . Thyroid nodule 2014  . Retinal artery thrombosis, left 2014    Past Surgical History  Procedure Laterality Date  . Left ankle   1996    s/p surgery with screws       The following portions of the patient's history were reviewed and updated as appropriate: Allergies, current medications, and problem list.    Review of Systems:   Patient denies headache, fevers, malaise, unintentional weight loss, skin rash, eye pain, sinus congestion and sinus pain, sore throat, dysphagia,  hemoptysis , cough, dyspnea, wheezing, chest pain, palpitations, orthopnea, edema, abdominal pain, nausea, melena, diarrhea, constipation, flank pain, dysuria, hematuria, urinary  Frequency, nocturia, numbness, tingling, seizures,  Focal weakness, Loss of consciousness,  Tremor, insomnia, depression, anxiety, and suicidal ideation.     History   Social History  . Marital Status: Married    Spouse Name: N/A    Number of Children: N/A  . Years of Education: N/A   Occupational History  . Not on file.   Social History Main Topics  . Smoking status: Former Smoker -- 6.00 packs/day for 56 years    Types: Cigarettes, Pipe    Quit date: 10/05/2002  . Smokeless tobacco: Current User    Types: Chew  Comment: smoked 6 pipes/day;smoked cigarettes 1/2 PPD ,quit chewing tobacco in 2004  . Alcohol Use: No  . Drug Use: No  . Sexual Activity: Not on file   Other Topics Concern  . Not on file   Social History Narrative  . No narrative on file    Objective:  Filed Vitals:   01/26/14 1116  BP: 152/68  Pulse: 51  Temp: 97.8 F (36.6 C)  Resp: 14     General appearance: alert, cooperative and appears stated age Ears: normal TM's and external ear canals both ears Throat: lips, mucosa, and tongue normal; teeth and gums normal Neck: no adenopathy, no carotid bruit, supple, symmetrical, trachea midline and thyroid not enlarged, symmetric, no  tenderness/mass/nodules Back: symmetric, no curvature. ROM normal. No CVA tenderness. Lungs: clear to auscultation bilaterally Heart: regular rate and rhythm, S1, S2 normal, no murmur, click, rub or gallop Abdomen: soft, non-tender; bowel sounds normal; no masses,  no organomegaly Pulses: 2+ and symmetric Skin: Skin color, texture, turgor normal. No rashes or lesions Lymph nodes: Cervical, supraclavicular, and axillary nodes normal.  Assessment and Plan:  HYPERTENSION, BENIGN Elevated today.  Dr Mariah MillingGollan is adjusting metoprolol to 1/2 pill bid and recommended increasing lisinopril to 10 mg prn.  Yesterdays's bp was 140/79   Atrial fibrillation Hear rate is Well controlled on current regimen., no changes today. Continue Pradaxa    Peripheral vascular disease He is currently asymptomatic   Type 2 diabetes mellitus with insulin therapy Well-controlled on current medications.  hemoglobin A1c has been consistently at or near  7.0 . He is up-to-date on eye exams and his foot exam is normal . l we'll repeat his urine microalbumin to creatinine ratio at next visit. He is on the appropriate medications.  Lab Results  Component Value Date   HGBA1C 7.0* 01/26/2014   Lab Results  Component Value Date   MICROALBUR 2.2* 01/26/2014       HYPERLIPIDEMIA-MIXED LDL and triglycerides are at goal on current medications. He has no side effects and liver enzymes are normal. No changes today.  Lab Results  Component Value Date   CHOL 128 10/20/2013   HDL 47.10 10/20/2013   LDLCALC 68 10/20/2013   LDLDIRECT 53.0 12/16/2012   TRIG 67.0 10/20/2013   CHOLHDL 3 10/20/2013   Lab Results  Component Value Date   ALT 13 01/26/2014   AST 17 01/26/2014   ALKPHOS 41 01/26/2014   BILITOT 0.7 01/26/2014      Updated Medication List Outpatient Encounter Prescriptions as of 01/26/2014  Medication Sig  . aspirin 81 MG tablet Take 81 mg by mouth daily.    Marland Kitchen. atorvastatin (LIPITOR) 10 MG tablet TAKE ONE TABLET  BY MOUTH EVERY DAY  . cholecalciferol (VITAMIN D) 1000 UNITS tablet Take 1,000 Units by mouth daily.  . dabigatran (PRADAXA) 150 MG CAPS capsule TAKE ONE CAPSULE BY MOUTH EVERY 12 HOURS  . finasteride (PROSCAR) 5 MG tablet TAKE ONE TABLET BY MOUTH ONCE DAILY  . glucose blood test strip Check blood sugar twice daily as instructed. Pt has Contour Next EZ. Dx 250.00  . Insulin Aspart Prot & Aspart (NOVOLOG MIX 70/30 FLEXPEN) (70-30) 100 UNIT/ML Pen INJECT 14 UNITS INTO THE SKIN TWICE DAILY WITH A MEAL.  Marland Kitchen. lisinopril (PRINIVIL,ZESTRIL) 10 MG tablet Take 1 tablet (10 mg total) by mouth daily.  . metFORMIN (GLUCOPHAGE) 1000 MG tablet TAKE ONE TABLET BY MOUTH TWICE DAILY WITH A MEAL  . metoprolol tartrate (LOPRESSOR) 25 MG tablet Take 1/2 tablet  am and 1/2 tablet in the evening daily.  Marland Kitchen MICROLET LANCETS MISC USE ONE LANCET TWICE DAILY TO CHECK GLUCOSE LEVELS  . NOVOFINE 30G X 8 MM MISC USE 1 NEEDLE EACH TIME INSULINS IS INJECTED INTO THE SKIN  . tamsulosin (FLOMAX) 0.4 MG CAPS capsule TAKE ONE CAPSULE BY MOUTH ONCE DAILY  . [DISCONTINUED] metoprolol tartrate (LOPRESSOR) 25 MG tablet Take 1/2 tablet am and 1 tablet in the evening daily.  . [DISCONTINUED] TDaP (BOOSTRIX) 5-2.5-18.5 LF-MCG/0.5 injection Inject 0.5 mLs into the muscle once.  . [DISCONTINUED] zoster vaccine live, PF, (ZOSTAVAX) 16109 UNT/0.65ML injection Inject 19,400 Units into the skin once.     Orders Placed This Encounter  Procedures  . Comprehensive metabolic panel  . Hemoglobin A1c  . Microalbumin / creatinine urine ratio  . Microalbumin / creatinine urine ratio  . HM DIABETES EYE EXAM    Return in about 3 months (around 04/27/2014).

## 2014-01-28 NOTE — Assessment & Plan Note (Signed)
LDL and triglycerides are at goal on current medications. He has no side effects and liver enzymes are normal. No changes today.  Lab Results  Component Value Date   CHOL 128 10/20/2013   HDL 47.10 10/20/2013   LDLCALC 68 10/20/2013   LDLDIRECT 53.0 12/16/2012   TRIG 67.0 10/20/2013   CHOLHDL 3 10/20/2013   Lab Results  Component Value Date   ALT 13 01/26/2014   AST 17 01/26/2014   ALKPHOS 41 01/26/2014   BILITOT 0.7 01/26/2014

## 2014-01-28 NOTE — Assessment & Plan Note (Addendum)
He is currently asymptomatic

## 2014-01-28 NOTE — Assessment & Plan Note (Signed)
Hear rate is Well controlled on current regimen., no changes today. Continue Pradaxa

## 2014-01-28 NOTE — Assessment & Plan Note (Signed)
Well-controlled on current medications.  hemoglobin A1c has been consistently at or near  7.0 . He is up-to-date on eye exams and his foot exam is normal . l we'll repeat his urine microalbumin to creatinine ratio at next visit. He is on the appropriate medications.  Lab Results  Component Value Date   HGBA1C 7.0* 01/26/2014   Lab Results  Component Value Date   MICROALBUR 2.2* 01/26/2014

## 2014-01-29 ENCOUNTER — Encounter: Payer: Self-pay | Admitting: *Deleted

## 2014-01-29 ENCOUNTER — Telehealth: Payer: Self-pay | Admitting: Internal Medicine

## 2014-01-29 NOTE — Telephone Encounter (Signed)
Relevant patient education mailed to patient.  

## 2014-02-20 ENCOUNTER — Other Ambulatory Visit: Payer: Self-pay | Admitting: Cardiovascular Disease

## 2014-02-20 ENCOUNTER — Telehealth: Payer: Self-pay | Admitting: Internal Medicine

## 2014-02-20 MED ORDER — FINASTERIDE 5 MG PO TABS: ORAL_TABLET | ORAL | Status: DC

## 2014-02-20 NOTE — Telephone Encounter (Signed)
Requested Prescriptions   Signed Prescriptions Disp Refills  . PRADAXA 150 MG CAPS capsule 60 capsule 3    Sig: TAKE ONE CAPSULE BY MOUTH EVERY 12 HOURS    Authorizing Provider: Antonieta IbaGOLLAN, TIMOTHY J    Ordering User: Kendrick FriesLOPEZ, Prajwal Fellner C

## 2014-02-20 NOTE — Telephone Encounter (Signed)
Finasteride refilled per protocol.

## 2014-03-12 ENCOUNTER — Telehealth: Payer: Self-pay | Admitting: *Deleted

## 2014-03-12 NOTE — Telephone Encounter (Signed)
Discussed issues with patients wife  She stated she is fatigued and feels like he is in afib  They are worried and think he might need an EKG  Scheduled patient with Dr. Elease Hashimoto 03/13/14  Patient instructed to contact emergency services if he feels his situation becomes emergent  Patient verbalized understanding

## 2014-03-12 NOTE — Telephone Encounter (Signed)
Patient had eye surgery on tues and thurs. And wife took his bp and the machine 127/62 pulse 53 w/ irregular heartbeat. Patient feeling very tired. Please call.

## 2014-03-13 ENCOUNTER — Ambulatory Visit (INDEPENDENT_AMBULATORY_CARE_PROVIDER_SITE_OTHER): Payer: Medicare Other | Admitting: Cardiovascular Disease

## 2014-03-13 ENCOUNTER — Encounter: Payer: Self-pay | Admitting: Cardiovascular Disease

## 2014-03-13 VITALS — BP 118/58 | HR 58 | Ht 69.5 in | Wt 153.8 lb

## 2014-03-13 DIAGNOSIS — I10 Essential (primary) hypertension: Secondary | ICD-10-CM

## 2014-03-13 DIAGNOSIS — I4891 Unspecified atrial fibrillation: Secondary | ICD-10-CM

## 2014-03-13 MED ORDER — METOPROLOL TARTRATE 25 MG PO TABS: ORAL_TABLET | ORAL | Status: DC

## 2014-03-13 NOTE — Assessment & Plan Note (Signed)
BP is well controlled.   I encouraged him to keep a BP log.

## 2014-03-13 NOTE — Patient Instructions (Signed)
Your physician has recommended you make the following change in your medication:  Decrease metoprolol to 12.5 mg once daily   Let us know how you are feeling in a few weeks   Your physician recommends that you schedule a follow-up appointment in:  3 months

## 2014-03-13 NOTE — Progress Notes (Addendum)
Patient ID: Chad Bailey, male    DOB: Aug 11, 1930, 80 y.o.   MRN: [id number]  HPI Comments: 80 year old gentleman, patient of Dr. Darrick Huntsman, with a history of diabetes, hypertension, hyperlipidemia, long smoking history who stopped 10 years ago, Presenting initially in January 2012 with atrial fibrillation, started on anticoagulation who presents for routine followup.  Previous stroke with loss of vision in his left eye. seen by ophthalmology who  referred him to neurology.  had embolic plaque as a cause of his vision problem. He has occasional dizzy spells.    in atrial fibrillation in 2012, during an office visit.  He had been chopping wood the day before. He converted to normal sinus rhythm by the next visit to the office. No medications have been changed.   He reports having a prior fall fall. He was leaning over to pick something up. . Balance is poor, he uses a cane. No recent falls on 80's visit Blood pressures today are typically well controlled. Typically running in the 120-140 range, rare 150 systolic Heart rates  in the 50s He has been maintaining normal sinus rhythm on his last several clinic visits. Hemoglobin A1c 7.5 Total cholesterol 113, LDL 53  Old outside Echocardiogram suggested borderline normal ejection fraction of 50-55%, normal left atrial size, normal right ventricular systolic pressures, mild TR, mild MR otherwise essentially normal study.   Notes indicate Holter monitor was performed for 24 hours August 21, 2010 joint average heart rate 71 beats per minute. Report suggests paroxysmal atrial fibrillation with primary rhythm being sinus rhythm with atrial flutter/fibrillation noted  ABIs  suggests possible occlusion of his left SFA, left ABI in the mild range 0.78  EKG today shows normal sinus rhythm with rate 61 beats per minute, no significant ST or T wave changes, APCs noted  March 13, 2014:  Chad Bailey is seen today as a work in visit - patient of Dr. Windell Hummingbird.   He  has had some weakness and heart beat irregularity since his eye surgery last week. .   He has been fatigued.   He thinks that he strength is improving  Outpatient Encounter Prescriptions as of 01/23/2014  Medication Sig  . aspirin 81 MG tablet Take 81 mg by mouth daily.    Marland Kitchen atorvastatin (LIPITOR) 10 MG tablet TAKE ONE TABLET BY MOUTH EVERY DAY  . cholecalciferol (VITAMIN D) 1000 UNITS tablet Take 1,000 Units by mouth daily.  . dabigatran (PRADAXA) 150 MG CAPS capsule TAKE ONE CAPSULE BY MOUTH EVERY 12 HOURS  . finasteride (PROSCAR) 5 MG tablet TAKE ONE TABLET BY MOUTH ONCE DAILY  . glucose blood test strip Check blood sugar twice daily as instructed. Pt has Contour Next EZ. Dx 250.00  . Insulin Aspart Prot & Aspart (NOVOLOG MIX 70/30 FLEXPEN) (70-30) 100 UNIT/ML Pen INJECT 14 UNITS INTO THE SKIN TWICE DAILY WITH A MEAL.  Marland Kitchen lisinopril (PRINIVIL,ZESTRIL) 5 MG tablet TAKE ONE TABLET BY MOUTH ONCE DAILY  . metFORMIN (GLUCOPHAGE) 1000 MG tablet TAKE ONE TABLET BY MOUTH TWICE DAILY WITH A MEAL  . metoprolol tartrate (LOPRESSOR) 25 MG tablet Take 1/2 tablet am and 1 tablet in the evening daily.  Marland Kitchen MICROLET LANCETS MISC USE ONE LANCET TWICE DAILY TO CHECK GLUCOSE LEVELS  . NOVOFINE 30G X 8 MM MISC USE 1 NEEDLE EACH TIME INSULINS IS INJECTED INTO THE SKIN  . tamsulosin (FLOMAX) 0.4 MG CAPS capsule TAKE ONE CAPSULE BY MOUTH ONCE DAILY  . TDaP (BOOSTRIX) 5-2.5-18.5 LF-MCG/0.5 injection Inject 0.5 mLs into  the muscle once.  . zoster vaccine live, PF, (ZOSTAVAX) [id number] UNT/0.65ML injection Inject 19,400 Units into the skin once.   Review of Systems  Constitutional: Negative.   HENT: Negative.   Eyes: Negative.   Respiratory: Negative.   Cardiovascular: Negative.   Gastrointestinal: Negative.   Endocrine: Negative.   Musculoskeletal: Negative.   Skin: Negative.   Allergic/Immunologic: Negative.   Neurological: Negative.   Hematological: Negative.   Psychiatric/Behavioral: Negative.   All other  systems reviewed and are negative.  BP 118/58  Pulse 58  Ht 5' 9.5" (1.765 m)  Wt 153 lb 12 oz (69.741 kg)  BMI 22.39 kg/m2  Physical Exam  Nursing note and vitals reviewed. Constitutional: He is oriented to person, place, and time. He appears well-developed and well-nourished.  HENT:  Head: Normocephalic.  Nose: Nose normal.  Mouth/Throat: Oropharynx is clear and moist.  Eyes: Conjunctivae are normal. Pupils are equal, round, and reactive to light.  Neck: Normal range of motion. Neck supple. No JVD present.  Cardiovascular: Normal rate, S1 normal, S2 normal and intact distal pulses.  A regularly irregular rhythm present. Exam reveals no gallop and no friction rub.   Murmur heard.  Crescendo systolic murmur is present with a grade of 2/6  Pulmonary/Chest: Effort normal and breath sounds normal. No respiratory distress. He has no wheezes. He has no rales. He exhibits no tenderness.  Abdominal: Soft. Bowel sounds are normal. He exhibits no distension. There is no tenderness.  Musculoskeletal: Normal range of motion. He exhibits no edema and no tenderness.  Lymphadenopathy:    He has no cervical adenopathy.  Neurological: He is alert and oriented to person, place, and time. Coordination normal.  Skin: Skin is warm and dry. No rash noted. No erythema.  Psychiatric: He has a normal mood and affect. His behavior is normal. Judgment and thought content normal.    ECG: 03/13/2014:  Sinus bradycardia with sinus arrhythmia. Otherwise the EKG is normal.    Assessment and Plan

## 2014-03-13 NOTE — Assessment & Plan Note (Signed)
Is having some additional fatigue and malaise since his eye surgery. May be getting a little bit better but his heart rate remains low. We will decrease his metoprolol to 12.5 mg a day. He will discontinue his nighttime dose. He will call us back in approximately 2 weeks to check in and report back.   He'll see Dr. Mariah Milling  In several months.  His ECG shows sinus rhythm today with sinus arrhythmia and PACs.

## 2014-03-26 ENCOUNTER — Telehealth: Payer: Self-pay

## 2014-03-26 ENCOUNTER — Other Ambulatory Visit: Payer: Self-pay | Admitting: Ophthalmology

## 2014-03-26 LAB — SEDIMENTATION RATE: Erythrocyte Sed Rate: 10 mm/hr (ref 0–20)

## 2014-03-26 LAB — HM DIABETES EYE EXAM

## 2014-03-26 NOTE — Telephone Encounter (Signed)
Pt's wife left message that she retrieved some old BP monitors that measure on the upper arm. She reports that one machine reads pt's BP at 101/52, HR 77, second reading after pt up walking around 119/50, HR 99. On a different brachial machine, 93/50, 80, second reading 94/54, 75.   Pt currently has an infection in his rt eye and is concerned that his abx are dropping his BP.   Spoke w/ pt and his wife.  On last ov, pt was advised to take lisinopril 1/2 tab QD and increase to whole 10mg  tab if BP is elevated.  He reports that he has been taking a whole lisinopril.  Advised him to cut back to 1/2 pill and call tomorrow w/ readings  Pt is agreeable and will call the office tomorrow.

## 2014-03-26 NOTE — Telephone Encounter (Signed)
Spoke w/ pt's wife.  She reports pt's BP readings this am: 8:00am:  83/55, 90 8:26am:  88/46, 58 9:36am:  107/50, 65 11:22am:  72/42, 83 She states that pt's BP monitor is the type worn on the wrist and is concerned about it's accuracy.  Advised pt to obtain a monitor that measures on the upper arm. She is agreeable to this and will call back with readings from new machine.

## 2014-04-17 LAB — HM DIABETES EYE EXAM

## 2014-04-27 ENCOUNTER — Ambulatory Visit: Payer: Medicare Other | Admitting: Internal Medicine

## 2014-05-01 ENCOUNTER — Ambulatory Visit (INDEPENDENT_AMBULATORY_CARE_PROVIDER_SITE_OTHER): Payer: Medicare Other | Admitting: Internal Medicine

## 2014-05-01 ENCOUNTER — Encounter: Payer: Self-pay | Admitting: Internal Medicine

## 2014-05-01 VITALS — BP 144/60 | HR 49 | Temp 98.2°F | Resp 14 | Ht 69.5 in | Wt 149.2 lb

## 2014-05-01 DIAGNOSIS — I10 Essential (primary) hypertension: Secondary | ICD-10-CM

## 2014-05-01 DIAGNOSIS — E785 Hyperlipidemia, unspecified: Secondary | ICD-10-CM

## 2014-05-01 DIAGNOSIS — E119 Type 2 diabetes mellitus without complications: Secondary | ICD-10-CM

## 2014-05-01 DIAGNOSIS — Z794 Long term (current) use of insulin: Secondary | ICD-10-CM

## 2014-05-01 NOTE — Patient Instructions (Addendum)
Your blood pressures are running low.  Please Cut your lisinopril in half and take 1/2 tablet daily .  If it is too small to cut,  Call for a reduced dosel prescription.  Please Bring the newest wrist cuff with you next time and we'll measure you with it and ours.   Your blood pressure should not go below 90/60 ,  If it falls below that ,  Stop the lisinopril completely   Your a1c is being checked today. If your SUGAR is < 90 before you eat,  Take 5 units of insulin and recheck your sugar 2 hrs after your meal.  If your sugar is > 140,  Continue to take your usual dose

## 2014-05-01 NOTE — Progress Notes (Signed)
Patient ID: Chad Bailey, male   DOB: 10/03/1930, 80 y.o.   MRN: [id number]   Patient Active Problem List   Diagnosis Date Noted  . Orthostasis 03/22/2013  . Peripheral vascular disease 03/01/2013  . CVA (cerebral vascular accident) 03/01/2013  . Type 2 diabetes mellitus with insulin therapy 04/14/2012  . History of tobacco abuse   . gouty arthritis   . HYPERLIPIDEMIA-MIXED 11/04/2010  . HYPERTENSION, BENIGN 11/04/2010  . Atrial fibrillation 11/04/2010    Subjective:  CC:   Chief Complaint  Patient presents with  . Follow-up    3 month  . Diabetes  . Hypertension    HPI:    Chad Bailey is a 80 y.o. 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  Was prescribed 7 days of antiviral and antibotics which he has finished .  Vision still poor,  Can see only shadows and light .  Using ghs eye drops  Home bps are low using the wrist cuff systolic in the 80s.  Wrist bp 91/49.  Has discontinued the  metoprolol per Dre Gollan and using 5 mg lisinopril for atrial fib   Blood sugars reviewed .  Fastings are 115 to 160 .  Post prandial evenign sugars 87 to 177  70/30 bid sliding scale uses 14 units for cbg > 140  ,  10 units for cbg > 80  Past Medical History  Diagnosis Date  . Non-insulin dependent diabetes mellitus   . A-fib   . Hyperlipidemia   . Fracture of left ankle   . Hypertension   . History of tobacco abuse   . gouty arthritis   . Loss of vision 01/2013    left eye  . Thyroid nodule 2014  . Retinal artery thrombosis, left 2014    Past Surgical History  Procedure Laterality Date  . Left ankle   1996    s/p surgery with screws  . Lasik      bilateral eye surgery       The following portions of the patient's history were reviewed and updated as appropriate: Allergies, current  medications, and problem list.    Review of Systems:   Patient denies headache, fevers, malaise, unintentional weight loss, skin rash, eye pain, sinus congestion and sinus pain, sore throat, dysphagia,  hemoptysis , cough, dyspnea, wheezing, chest pain, palpitations, orthopnea, edema, abdominal pain, nausea, melena, diarrhea, constipation, flank pain, dysuria, hematuria, urinary  Frequency, nocturia, numbness, tingling, seizures,  Focal weakness, Loss of consciousness,  Tremor, insomnia, depression, anxiety, and suicidal ideation.     History   Social History  . Marital Status: Married    Spouse Name: N/A    Number of Children: N/A  . Years of Education: N/A   Occupational History  . Not on file.   Social History Main Topics  . Smoking status: Former Smoker -- 6.00 packs/day for 56 years    Types: Cigarettes, Pipe    Quit date: 10/05/2002  . Smokeless tobacco: Current User    Types: Chew     Comment: smoked 6 pipes/day;smoked cigarettes 1/2 PPD ,quit chewing tobacco in 2004  . Alcohol Use: No  . Drug Use: No  . Sexual Activity: Not on file   Other Topics Concern  . Not on file   Social History Narrative  .  No narrative on file    Objective:  Filed Vitals:   05/01/14 1453  BP: 144/60  Pulse: 49  Temp: 98.2 F (36.8 C)  Resp: 14     General appearance: alert, cooperative and appears stated age Ears: normal TM's and external ear canals both ears Throat: lips, mucosa, and tongue normal; teeth and gums normal Neck: no adenopathy, no carotid bruit, supple, symmetrical, trachea midline and thyroid not enlarged, symmetric, no tenderness/mass/nodules Back: symmetric, no curvature. ROM normal. No CVA tenderness. Lungs: clear to auscultation bilaterally Heart: regular rate and rhythm, S1, S2 normal, no murmur, click, rub or gallop Abdomen: soft, non-tender; bowel sounds normal; no masses,  no organomegaly Pulses: 2+ and symmetric Skin: Skin color, texture, turgor  normal. No rashes or lesions Lymph nodes: Cervical, supraclavicular, and axillary nodes normal.  Assessment and Plan:  HYPERTENSION, BENIGN Reducing lisinopril dose for hypotension. Return with home bp monitor  for calibration .  Lab Results  Component Value Date   CREATININE 0.7 05/01/2014   Lab Results  Component Value Date   NA 138 05/01/2014   K 4.8 05/01/2014   CL 100 05/01/2014   CO2 31 05/01/2014      Type 2 diabetes mellitus with insulin therapy Well-controlled on current medications.  hemoglobin A1c has been consistently on or under 7.0 . He is up-to-date on eye exams and his foot exam is normal . Sliding scale changed.  we'll repeat his urine microalbumin to creatinine ratio at next visit. He is on the appropriate medications.  Lab Results  Component Value Date   HGBA1C 6.4 05/01/2014   Lab Results  Component Value Date   MICROALBUR 2.2* 01/26/2014         HYPERLIPIDEMIA-MIXED LDL and triglycerides have been at goal on current medications. He has no side effects and liver enzymes are normal. No changes today.  Lab Results  Component Value Date   CHOL 128 10/20/2013   HDL 47.10 10/20/2013   LDLCALC 68 10/20/2013   LDLDIRECT 53.0 12/16/2012   TRIG 67.0 10/20/2013   CHOLHDL 3 10/20/2013   Lab Results  Component Value Date   ALT 16 05/01/2014   AST 21 05/01/2014   ALKPHOS 40 05/01/2014   BILITOT 0.8 05/01/2014      A total of 40 minutes was spent with patient more than half of which was spent in counseling, reviewing records from other prviders and coordination of care.   Updated Medication List Outpatient Encounter Prescriptions as of 05/01/2014  Medication Sig  . aspirin 81 MG tablet Take 81 mg by mouth daily.    Marland Kitchen atorvastatin (LIPITOR) 10 MG tablet TAKE ONE TABLET BY MOUTH EVERY DAY  . Brinzolamide-Brimonidine (SIMBRINZA) 1-0.2 % SUSP Apply to eye 2 (two) times daily.  . cholecalciferol (VITAMIN D) 1000 UNITS tablet Take 1,000 Units by mouth daily.  .  finasteride (PROSCAR) 5 MG tablet TAKE ONE TABLET BY MOUTH ONCE DAILY  . glucose blood test strip Check blood sugar twice daily as instructed. Pt has Contour Next EZ. Dx 250.00  . Insulin Aspart Prot & Aspart (NOVOLOG MIX 70/30 FLEXPEN) (70-30) 100 UNIT/ML Pen INJECT 14 UNITS INTO THE SKIN TWICE DAILY WITH A MEAL.  Marland Kitchen lisinopril (PRINIVIL,ZESTRIL) 10 MG tablet Take 5 mg by mouth daily.  . metFORMIN (GLUCOPHAGE) 1000 MG tablet TAKE ONE TABLET BY MOUTH TWICE DAILY WITH A MEAL  . MICROLET LANCETS MISC USE ONE LANCET TWICE DAILY TO CHECK GLUCOSE LEVELS  . NOVOFINE 30G X 8 MM MISC USE  1 NEEDLE EACH TIME INSULINS IS INJECTED INTO THE SKIN  . PRADAXA 150 MG CAPS capsule TAKE ONE CAPSULE BY MOUTH EVERY 12 HOURS  . tamsulosin (FLOMAX) 0.4 MG CAPS capsule TAKE ONE CAPSULE BY MOUTH ONCE DAILY  . timolol (BETIMOL) 0.5 % ophthalmic solution Apply 1 drop to eye 2 (two) times daily.  . [DISCONTINUED] lisinopril (PRINIVIL,ZESTRIL) 10 MG tablet Take 1 tablet (10 mg total) by mouth daily.  . Difluprednate (DUREZOL) 0.05 % EMUL Apply to eye 2 (two) times daily.  . [DISCONTINUED] metoprolol tartrate (LOPRESSOR) 25 MG tablet Take 1/2 tablet once daily in the morning     Orders Placed This Encounter  Procedures  . Hemoglobin A1c  . Microalbumin / creatinine urine ratio  . Comprehensive metabolic panel    Return in about 3 months (around 08/01/2014) for follow up diabetes.

## 2014-05-01 NOTE — Progress Notes (Signed)
Pre-visit discussion using our clinic review tool. No additional management support is needed unless otherwise documented below in the visit note.  

## 2014-05-02 ENCOUNTER — Encounter: Payer: Self-pay | Admitting: Internal Medicine

## 2014-05-02 LAB — COMPREHENSIVE METABOLIC PANEL
ALBUMIN: 4 g/dL (ref 3.5–5.2)
ALT: 16 U/L (ref 0–53)
AST: 21 U/L (ref 0–37)
Alkaline Phosphatase: 40 U/L (ref 39–117)
BUN: 18 mg/dL (ref 6–23)
CALCIUM: 9.5 mg/dL (ref 8.4–10.5)
CHLORIDE: 100 meq/L (ref 96–112)
CO2: 31 mEq/L (ref 19–32)
Creatinine, Ser: 0.7 mg/dL (ref 0.4–1.5)
GFR: 107.63 mL/min (ref >60.00)
Glucose, Bld: 62 mg/dL — ABNORMAL LOW (ref 70–99)
POTASSIUM: 4.8 meq/L (ref 3.5–5.1)
Sodium: 138 mEq/L (ref 135–145)
TOTAL PROTEIN: 6.5 g/dL (ref 6.0–8.3)
Total Bilirubin: 0.8 mg/dL (ref 0.2–1.2)

## 2014-05-02 LAB — HEMOGLOBIN A1C: HEMOGLOBIN A1C: 6.4 % (ref 4.6–6.5)

## 2014-05-02 NOTE — Assessment & Plan Note (Signed)
LDL and triglycerides have been at goal on current medications. He has no side effects and liver enzymes are normal. No changes today.  Lab Results  Component Value Date   CHOL 128 10/20/2013   HDL 47.10 10/20/2013   LDLCALC 68 10/20/2013   LDLDIRECT 53.0 12/16/2012   TRIG 67.0 10/20/2013   CHOLHDL 3 10/20/2013   Lab Results  Component Value Date   ALT 16 05/01/2014   AST 21 05/01/2014   ALKPHOS 40 05/01/2014   BILITOT 0.8 05/01/2014

## 2014-05-02 NOTE — Assessment & Plan Note (Addendum)
Reducing lisinopril dose for hypotension. Return with home bp monitor  for calibration .  Lab Results  Component Value Date   CREATININE 0.7 05/01/2014   Lab Results  Component Value Date   NA 138 05/01/2014   K 4.8 05/01/2014   CL 100 05/01/2014   CO2 31 05/01/2014

## 2014-05-02 NOTE — Assessment & Plan Note (Addendum)
Well-controlled on current medications.  hemoglobin A1c has been consistently on or under 7.0 . He is up-to-date on eye exams and his foot exam is normal . Sliding scale changed.  we'll repeat his urine microalbumin to creatinine ratio at next visit. He is on the appropriate medications.  Lab Results  Component Value Date   HGBA1C 6.4 05/01/2014   Lab Results  Component Value Date   MICROALBUR 2.2* 01/26/2014

## 2014-05-04 ENCOUNTER — Encounter: Payer: Self-pay | Admitting: *Deleted

## 2014-05-22 ENCOUNTER — Other Ambulatory Visit: Payer: Self-pay | Admitting: Internal Medicine

## 2014-05-22 DIAGNOSIS — R350 Frequency of micturition: Secondary | ICD-10-CM

## 2014-05-22 MED ORDER — LISINOPRIL 2.5 MG PO TABS: 2.5000 mg | ORAL_TABLET | Freq: Every day | ORAL | Status: DC

## 2014-05-23 ENCOUNTER — Other Ambulatory Visit (INDEPENDENT_AMBULATORY_CARE_PROVIDER_SITE_OTHER): Payer: Medicare Other | Admitting: *Deleted

## 2014-05-23 DIAGNOSIS — Z139 Encounter for screening, unspecified: Secondary | ICD-10-CM

## 2014-05-23 DIAGNOSIS — R350 Frequency of micturition: Secondary | ICD-10-CM

## 2014-05-23 LAB — URINALYSIS, ROUTINE W REFLEX MICROSCOPIC
BILIRUBIN URINE: NEGATIVE
Hgb urine dipstick: NEGATIVE
KETONES UR: NEGATIVE
Leukocytes, UA: NEGATIVE
Nitrite: NEGATIVE
RBC / HPF: NONE SEEN (ref 0–?)
Specific Gravity, Urine: <=1.005 — AB (ref 1.000–1.030)
Total Protein, Urine: NEGATIVE
URINE GLUCOSE: NEGATIVE
UROBILINOGEN UA: 0.2 (ref 0.0–1.0)
WBC UA: NONE SEEN (ref 0–?)
pH: 5.5 (ref 5.0–8.0)

## 2014-05-23 LAB — MICROALBUMIN / CREATININE URINE RATIO
CREATININE, U: 43.2 mg/dL
MICROALB/CREAT RATIO: 0.9 mg/g (ref 0.0–30.0)
Microalb, Ur: 0.4 mg/dL (ref 0.0–1.9)

## 2014-05-24 LAB — URINE CULTURE: Colony Count: 60000

## 2014-05-31 ENCOUNTER — Telehealth: Payer: Self-pay

## 2014-05-31 NOTE — Telephone Encounter (Signed)
Pt called states he needs surgical clearance for eye surgery, but needs to talk with the nurse first. Please call.

## 2014-06-14 ENCOUNTER — Other Ambulatory Visit: Payer: Self-pay | Admitting: *Deleted

## 2014-06-14 MED ORDER — GLUCOSE BLOOD VI STRP: ORAL_STRIP | Status: DC

## 2014-06-14 MED ORDER — TAMSULOSIN HCL 0.4 MG PO CAPS: ORAL_CAPSULE | ORAL | Status: DC

## 2014-06-14 MED ORDER — METFORMIN HCL 1000 MG PO TABS
ORAL_TABLET | ORAL | Status: DC
Start: 2014-06-14 — End: 2014-12-27

## 2014-06-18 ENCOUNTER — Encounter: Payer: Self-pay | Admitting: Cardiovascular Disease

## 2014-06-18 ENCOUNTER — Ambulatory Visit (INDEPENDENT_AMBULATORY_CARE_PROVIDER_SITE_OTHER): Payer: Medicare Other | Admitting: Cardiovascular Disease

## 2014-06-18 VITALS — BP 120/80 | HR 46 | Ht 72.0 in | Wt 148.0 lb

## 2014-06-18 DIAGNOSIS — I498 Other specified cardiac arrhythmias: Secondary | ICD-10-CM

## 2014-06-18 DIAGNOSIS — E119 Type 2 diabetes mellitus without complications: Secondary | ICD-10-CM

## 2014-06-18 DIAGNOSIS — E785 Hyperlipidemia, unspecified: Secondary | ICD-10-CM

## 2014-06-18 DIAGNOSIS — Z794 Long term (current) use of insulin: Secondary | ICD-10-CM

## 2014-06-18 DIAGNOSIS — I4891 Unspecified atrial fibrillation: Secondary | ICD-10-CM

## 2014-06-18 DIAGNOSIS — Z01810 Encounter for preprocedural cardiovascular examination: Secondary | ICD-10-CM | POA: Insufficient documentation

## 2014-06-18 DIAGNOSIS — R001 Bradycardia, unspecified: Secondary | ICD-10-CM

## 2014-06-18 DIAGNOSIS — I10 Essential (primary) hypertension: Secondary | ICD-10-CM

## 2014-06-18 NOTE — Assessment & Plan Note (Signed)
Blood pressure is well controlled on today's visit. No changes made to the medications. Metoprolol held for bradycardia in the past

## 2014-06-18 NOTE — Assessment & Plan Note (Signed)
We have encouraged continued exercise, careful diet management in an effort to lose weight. 

## 2014-06-18 NOTE — Patient Instructions (Signed)
You are doing well. No medication changes were made.  Please call us if you have new issues that need to be addressed before your next appt.  Your physician wants you to follow-up in: 6 months.  You will receive a reminder letter in the mail two months in advance. If you don't receive a letter, please call our office to schedule the follow-up appointment.   

## 2014-06-18 NOTE — Assessment & Plan Note (Signed)
Cholesterol is at goal on the current lipid regimen. No changes to the medications were made.  

## 2014-06-18 NOTE — Assessment & Plan Note (Signed)
Asymptomatic. By his records, heart rate varies from the 40s up to 80s. We'll continue to monitor for now

## 2014-06-18 NOTE — Progress Notes (Signed)
Patient ID: Chad Bailey, male    DOB: Jun 28, 1930, 80 y.o.   MRN: [id number]  HPI Comments: 80 year old gentleman, patient of Dr. Darrick Huntsman, with a history of diabetes, hypertension, hyperlipidemia, long smoking history who stopped 10 years ago, Presenting initially in January 2012 with atrial fibrillation, started on anticoagulation who presents for routine followup.  Previous stroke with loss of vision in his left eye. seen by ophthalmology who  referred him to neurology.  had embolic plaque as a cause of his vision problem. He has occasional dizzy spells.    in atrial fibrillation in 2012, during an office visit.  He converted to normal sinus rhythm by the next visit to the office.   prior fall fall. He was leaning over to pick something up.  Balance is poor, he uses a cane.   In followup today, he is scheduled to have cataract surgery. He and his wife are concerned about stopping the anticoagulation. He denies any lightheadedness or dizziness. Overall feels well.  He's taking one quarter of a pill of lisinopril. Blood pressure ranging between 110 and 150 systolic, heart rates in the 40s up to 80  He has been maintaining normal sinus rhythm on his last several clinic visits. Hemoglobin A1c 7.5 Total cholesterol 113, LDL 53  Old outside Echocardiogram suggested borderline normal ejection fraction of 50-55%, normal left atrial size, normal right ventricular systolic pressures, mild TR, mild MR otherwise essentially normal study.   Notes indicate Holter monitor was performed for 24 hours August 21, 2010 joint average heart rate 71 beats per minute. Report suggests paroxysmal atrial fibrillation with primary rhythm being sinus rhythm with atrial flutter/fibrillation noted  ABIs  suggests possible occlusion of his left SFA, left ABI in the mild range 0.78  EKG today shows normal sinus rhythm with rate 46 beats per minute, no significant ST or T wave changes   Outpatient Encounter  Prescriptions as of 06/18/2014  Medication Sig  . aspirin 81 MG tablet Take 81 mg by mouth daily.    Marland Kitchen atorvastatin (LIPITOR) 10 MG tablet TAKE ONE TABLET BY MOUTH EVERY DAY  . Brinzolamide-Brimonidine (SIMBRINZA) 1-0.2 % SUSP Apply to eye 2 (two) times daily.  . cholecalciferol (VITAMIN D) 1000 UNITS tablet Take 1,000 Units by mouth daily.  . Difluprednate (DUREZOL) 0.05 % EMUL Apply to eye 2 (two) times daily.  . finasteride (PROSCAR) 5 MG tablet TAKE ONE TABLET BY MOUTH ONCE DAILY  . glucose blood test strip Check blood sugar twice daily as instructed. Pt has Contour Next EZ. Dx 250.00  . Insulin Aspart Prot & Aspart (NOVOLOG MIX 70/30 FLEXPEN) (70-30) 100 UNIT/ML Pen INJECT 14 UNITS INTO THE SKIN TWICE DAILY WITH A MEAL.  Marland Kitchen lisinopril (PRINIVIL,ZESTRIL) 2.5 MG tablet Take 1 tablet (2.5 mg total) by mouth daily.  . metFORMIN (GLUCOPHAGE) 1000 MG tablet TAKE ONE TABLET BY MOUTH TWICE DAILY WITH A MEAL  . MICROLET LANCETS MISC USE ONE LANCET TWICE DAILY TO CHECK GLUCOSE LEVELS  . NOVOFINE 30G X 8 MM MISC USE 1 NEEDLE EACH TIME INSULINS IS INJECTED INTO THE SKIN  . PRADAXA 150 MG CAPS capsule TAKE ONE CAPSULE BY MOUTH EVERY 12 HOURS  . tamsulosin (FLOMAX) 0.4 MG CAPS capsule TAKE ONE CAPSULE BY MOUTH ONCE DAILY  . timolol (BETIMOL) 0.5 % ophthalmic solution Apply 1 drop to eye 2 (two) times daily.    Review of Systems  Constitutional: Negative.   HENT: Negative.   Eyes: Negative.   Respiratory: Negative.   Cardiovascular:  Negative.   Gastrointestinal: Negative.   Endocrine: Negative.   Musculoskeletal: Negative.   Skin: Negative.   Allergic/Immunologic: Negative.   Neurological: Negative.   Hematological: Negative.   Psychiatric/Behavioral: Negative.   All other systems reviewed and are negative.  BP 120/80  Pulse 46  Ht 6' (1.829 m)  Wt 148 lb (67.132 kg)  BMI 20.07 kg/m2  Physical Exam  Nursing note and vitals reviewed. Constitutional: He is oriented to person, place, and  time. He appears well-developed and well-nourished.  HENT:  Head: Normocephalic.  Nose: Nose normal.  Mouth/Throat: Oropharynx is clear and moist.  Eyes: Conjunctivae are normal. Pupils are equal, round, and reactive to light.  Neck: Normal range of motion. Neck supple. No JVD present.  Cardiovascular: Normal rate, S1 normal, S2 normal and intact distal pulses.  A regularly irregular rhythm present. Exam reveals no gallop and no friction rub.   Murmur heard.  Crescendo systolic murmur is present with a grade of 2/6  Pulmonary/Chest: Effort normal and breath sounds normal. No respiratory distress. He has no wheezes. He has no rales. He exhibits no tenderness.  Abdominal: Soft. Bowel sounds are normal. He exhibits no distension. There is no tenderness.  Musculoskeletal: Normal range of motion. He exhibits no edema and no tenderness.  Lymphadenopathy:    He has no cervical adenopathy.  Neurological: He is alert and oriented to person, place, and time. Coordination normal.  Skin: Skin is warm and dry. No rash noted. No erythema.  Psychiatric: He has a normal mood and affect. His behavior is normal. Judgment and thought content normal.      Assessment and Plan

## 2014-06-18 NOTE — Assessment & Plan Note (Signed)
No recent episodes of atrial fibrillation. Acceptable risk to come off anticoagulation for his eye surgery. No further testing needed

## 2014-06-18 NOTE — Assessment & Plan Note (Signed)
Acceptable risk for upcoming cataract surgery. No further testing needed. He will hold his anticoagulation 3 days prior to the procedure

## 2014-06-25 ENCOUNTER — Encounter: Payer: Self-pay | Admitting: *Deleted

## 2014-06-25 NOTE — Telephone Encounter (Signed)
Faxed clearance to [facility] ([place]) per Eula Listen

## 2014-06-27 ENCOUNTER — Other Ambulatory Visit: Payer: Self-pay | Admitting: Cardiovascular Disease

## 2014-07-02 ENCOUNTER — Ambulatory Visit: Payer: Self-pay | Admitting: Ophthalmology

## 2014-07-23 ENCOUNTER — Ambulatory Visit: Payer: Medicare Other | Admitting: Cardiovascular Disease

## 2014-08-01 ENCOUNTER — Ambulatory Visit (INDEPENDENT_AMBULATORY_CARE_PROVIDER_SITE_OTHER): Payer: Medicare Other | Admitting: Internal Medicine

## 2014-08-01 ENCOUNTER — Encounter: Payer: Self-pay | Admitting: Internal Medicine

## 2014-08-01 VITALS — BP 138/66 | HR 60 | Temp 98.7°F | Resp 14 | Ht 69.5 in | Wt 149.5 lb

## 2014-08-01 DIAGNOSIS — I951 Orthostatic hypotension: Secondary | ICD-10-CM

## 2014-08-01 DIAGNOSIS — Z794 Long term (current) use of insulin: Secondary | ICD-10-CM

## 2014-08-01 DIAGNOSIS — Z23 Encounter for immunization: Secondary | ICD-10-CM

## 2014-08-01 DIAGNOSIS — E1151 Type 2 diabetes mellitus with diabetic peripheral angiopathy without gangrene: Secondary | ICD-10-CM

## 2014-08-01 DIAGNOSIS — I10 Essential (primary) hypertension: Secondary | ICD-10-CM

## 2014-08-01 DIAGNOSIS — E119 Type 2 diabetes mellitus without complications: Secondary | ICD-10-CM

## 2014-08-01 DIAGNOSIS — E785 Hyperlipidemia, unspecified: Secondary | ICD-10-CM

## 2014-08-01 LAB — COMPREHENSIVE METABOLIC PANEL
ALBUMIN: 3.5 g/dL (ref 3.5–5.2)
ALT: 12 U/L (ref 0–53)
AST: 18 U/L (ref 0–37)
Alkaline Phosphatase: 44 U/L (ref 39–117)
BUN: 18 mg/dL (ref 6–23)
CALCIUM: 9.6 mg/dL (ref 8.4–10.5)
CHLORIDE: 104 meq/L (ref 96–112)
CO2: 29 mEq/L (ref 19–32)
Creatinine, Ser: 0.7 mg/dL (ref 0.4–1.5)
GFR: 118.59 mL/min (ref >60.00)
Glucose, Bld: 70 mg/dL (ref 70–99)
POTASSIUM: 4.5 meq/L (ref 3.5–5.1)
Sodium: 140 mEq/L (ref 135–145)
Total Bilirubin: 0.7 mg/dL (ref 0.2–1.2)
Total Protein: 6.4 g/dL (ref 6.0–8.3)

## 2014-08-01 LAB — LIPID PANEL
CHOL/HDL RATIO: 2
Cholesterol: 117 mg/dL (ref 0–200)
HDL: 49.2 mg/dL (ref >39.00)
LDL CALC: 57 mg/dL (ref 0–99)
NonHDL: 67.8
Triglycerides: 53 mg/dL (ref 0.0–149.0)
VLDL: 10.6 mg/dL (ref 0.0–40.0)

## 2014-08-01 LAB — HM DIABETES FOOT EXAM: HM Diabetic Foot Exam: NORMAL

## 2014-08-01 LAB — HEMOGLOBIN A1C: Hgb A1c MFr Bld: 6.5 % (ref 4.6–6.5)

## 2014-08-01 MED ORDER — INSULIN LISPRO PROT & LISPRO (50-50 MIX) 100 UNIT/ML KWIKPEN: PEN_INJECTOR | SUBCUTANEOUS | Status: DC

## 2014-08-01 MED ORDER — INSULIN ASPART PROT & ASPART (70-30 MIX) 100 UNIT/ML PEN: PEN_INJECTOR | SUBCUTANEOUS | Status: DC

## 2014-08-01 NOTE — Progress Notes (Addendum)
InsPatient ID: Chad Bailey, male   DOB: 08/12/1930, 80 y.o.   MRN: [id number]  Patient Active Problem List   Diagnosis Date Noted  . Vision loss, bilateral 12/19/2014  . Preoperative cardiovascular examination 06/18/2014  . Bradycardia 06/18/2014  . Orthostasis 03/22/2013  . Peripheral vascular disease in diabetes mellitus (HCC) 03/01/2013  . CVA (cerebral vascular accident) (HCC) 03/01/2013  . 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 History of tobacco abuse   . gouty arthritis   . Hyperlipidemia 11/04/2010  . HYPERTENSION, BENIGN 11/04/2010  . Atrial fibrillation (HCC) 11/04/2010    Subjective:  CC:   Chief Complaint  Patient presents with  . Follow-up    3 month  . Diabetes    HPI:   Chad Bailey is a 80 y.o. male who presents for  3 month follow up on DM type 2, hyperlipidemia,  And Hypertension ,  Lab Results  Component Value Date   HGBA1C 6.4 05/01/2014    Lab Results  Component Value Date   CHOL 128 10/20/2013   HDL 47.10 10/20/2013   LDLCALC 68 10/20/2013   LDLDIRECT 53.0 12/16/2012   TRIG 67.0 10/20/2013   CHOLHDL 3 10/20/2013   Lab Results  Component Value Date   MICROALBUR 0.4 05/23/2014    [facility] eye surgery  [facility] new lense and some kind of stent placed with laser surgery  To right eye and left eye. Has had follow up every 5 days, the pressure is coming down per patient  Has been having hypotension and had to reduce lisinopril to 1/4 tablet.  Measuring BP daily and adjusts lisinopril dose,  If 140/80 or higher takes 1/4 of lisinopril  Insurance is manadating a change of novolog  to humalog .  He is currently using a sliding scale to dose his novolog:   140  14 units   Below 140  7 to 10      Past Medical History  Diagnosis Date  . Non-insulin dependent diabetes mellitus   . A-fib   . Hyperlipidemia   . Fracture of left ankle   .  Hypertension   . History of tobacco abuse   . gouty arthritis   . Loss of vision 01/2013    left eye  . Thyroid nodule 2014  . Retinal artery thrombosis, left 2014    Past Surgical History  Procedure Laterality Date  . Left ankle   1996    s/p surgery with screws  . Lasik      bilateral eye surgery  . Eye surgery      right       The following portions of the patient's history were reviewed and updated as appropriate: Allergies, current medications, and problem list.    Review of Systems:   Patient denies headache, fevers, malaise, unintentional weight loss, skin rash, eye pain, sinus congestion and sinus pain, sore throat, dysphagia,  hemoptysis , cough, dyspnea, wheezing, chest pain, palpitations, orthopnea, edema, abdominal pain, nausea, melena, diarrhea, constipation, flank pain, dysuria, hematuria, urinary  Frequency, nocturia, numbness, tingling, seizures,  Focal weakness, Loss of consciousness,  Tremor, insomnia, depression, anxiety, and suicidal ideation.     Social History   Social History  . Marital Status: Married    Spouse Name: N/A  . Number of Children: N/A  . Years of Education: N/A   Occupational History  . Not on file.   Social History  Main Topics  . Smoking status: Former Smoker -- 6.00 packs/day for 56 years    Types: Cigarettes, Pipe    Quit date: 10/05/2002  . Smokeless tobacco: Former Neurosurgeon    Types: Chew     Comment: smoked 6 pipes/day;smoked cigarettes 1/2 PPD ,quit chewing tobacco in 2004  . Alcohol Use: No  . Drug Use: No  . Sexual Activity: Not on file   Other Topics Concern  . Not on file   Social History Narrative    Objective:  Filed Vitals:   08/01/14 1117  BP: 138/66  Pulse: 60  Temp: 98.7 F (37.1 C)  Resp: 14     General appearance: alert, cooperative and appears stated age Ears: normal TM's and external ear canals both ears Throat: lips, mucosa, and tongue normal; teeth and gums normal Neck: no adenopathy, no  carotid bruit, supple, symmetrical, trachea midline and thyroid not enlarged, symmetric, no tenderness/mass/nodules Back: symmetric, no curvature. ROM normal. No CVA tenderness. Lungs: clear to auscultation bilaterally Heart: regular rate and rhythm, S1, S2 normal, no murmur, click, rub or gallop Abdomen: soft, non-tender; bowel sounds normal; no masses,  no organomegaly Pulses: 2+ and symmetric Skin: Skin color, texture, turgor normal. No rashes or lesions Lymph nodes: Cervical, supraclavicular, and axillary nodes normal.  Assessment and Plan:  Orthostasis Aggravated by low BP.  Reducing lisinopril dose today to 2.5 mg for systolic > 140  HYPERTENSION, BENIGN Reducing lisinopril dose for hypotension. Return with home bp monitor  for calibration .  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  hemoglobin A1c has been consistently on or under 7.0 . He is up-to-date on eye exams and his foot exam is normal . Insurance is requiring a change to humalog in January 2016. ,  Will use 50/50 as patient has had lows caused by too much long acting insulin.   Lab Results  Component Value Date   HGBA1C 6.5 08/01/2014   Lab Results  Component Value Date   MICROALBUR 0.4 05/23/2014           Hyperlipidemia LDL and triglycerides have been at goal on current medications. He has no side effects and liver enzymes are normal. No changes today.  Lab Results  Component Value Date   CHOL 117 08/01/2014   HDL 49.20 08/01/2014   LDLCALC 57 08/01/2014   LDLDIRECT 53.0 12/16/2012   TRIG 53.0 08/01/2014   CHOLHDL 2 08/01/2014   Lab Results  Component Value Date   ALT 12 08/01/2014   AST 18 08/01/2014   ALKPHOS  44 08/01/2014   BILITOT 0.7 08/01/2014         Peripheral vascular disease in diabetes mellitus (HCC) Managed with aggressive statin therapy ,  Asymptomatic currently    Updated Medication List Outpatient Encounter Prescriptions as of 08/01/2014  Medication Sig  . aspirin 81 MG tablet Take 81 mg by mouth daily.    . Brinzolamide-Brimonidine (SIMBRINZA) 1-0.2 % SUSP Apply to eye 2 (two) times daily.  . cholecalciferol (VITAMIN D) 1000 UNITS tablet Take 1,000 Units by mouth daily.  . Difluprednate (DUREZOL) 0.05 % EMUL Apply to eye 2 (two) times daily.  Marland Kitchen lisinopril (PRINIVIL,ZESTRIL) 2.5  MG tablet Take 1 tablet (2.5 mg total) by mouth daily.  . timolol (BETIMOL) 0.5 % ophthalmic solution Apply 1 drop to eye 2 (two) times daily.  . [DISCONTINUED] atorvastatin (LIPITOR) 10 MG tablet TAKE ONE TABLET BY MOUTH EVERY DAY  . [DISCONTINUED] finasteride (PROSCAR) 5 MG tablet TAKE ONE TABLET BY MOUTH ONCE DAILY  . [DISCONTINUED] glucose blood test strip Check blood sugar twice daily as instructed. Pt has Contour Next EZ. Dx 250.00  . [DISCONTINUED] Insulin Aspart Prot & Aspart (NOVOLOG MIX 70/30 FLEXPEN) (70-30) 100 UNIT/ML Pen INJECT 14 UNITS INTO THE SKIN TWICE DAILY WITH A MEAL.  . [DISCONTINUED] Insulin Aspart Prot & Aspart (NOVOLOG MIX 70/30 FLEXPEN) (70-30) 100 UNIT/ML Pen INJECT 14 UNITS INTO THE SKIN TWICE DAILY WITH A MEAL.  . [DISCONTINUED] metFORMIN (GLUCOPHAGE) 1000 MG tablet TAKE ONE TABLET BY MOUTH TWICE DAILY WITH A MEAL  . [DISCONTINUED] MICROLET LANCETS MISC USE ONE LANCET TWICE DAILY TO CHECK GLUCOSE LEVELS  . [DISCONTINUED] NOVOFINE 30G X 8 MM MISC USE 1 NEEDLE EACH TIME INSULINS IS INJECTED INTO THE SKIN  . [DISCONTINUED] PRADAXA 150 MG CAPS capsule TAKE ONE CAPSULE BY MOUTH EVERY 12 HOURS  . [DISCONTINUED] tamsulosin (FLOMAX) 0.4 MG CAPS capsule TAKE ONE CAPSULE BY MOUTH ONCE DAILY  . Insulin Lispro Prot & Lispro (HUMALOG MIX 50/50 KWIKPEN) (50-50) 100 UNIT/ML Kwikpen 14  units twice daily before breakfast and dinner   No facility-administered encounter medications on file as of 08/01/2014.     Orders Placed This Encounter  Procedures  . Pneumococcal conjugate vaccine 13-valent  . Comprehensive metabolic panel  . Hemoglobin A1c  . Lipid panel  . HM DIABETES FOOT EXAM    No Follow-up on file.

## 2014-08-01 NOTE — Progress Notes (Signed)
Pre-visit discussion using our clinic review tool. No additional management support is needed unless otherwise documented below in the visit note.  

## 2014-08-01 NOTE — Patient Instructions (Addendum)
You can stop the lisinopril  Completely for a week and follow up with daily blood pressures,  I'l review them in a week or two and decide if you need any medication   You can get the Novolog 70/30 refilled one more time before the end of the year and use it until it is gone  The Humalog Kwikpen 50/50 is a balance of short and intermediate acting insulin that has more short acting than your previous Novolog ,  So when you start it ,  Use 12 units instead of 14 units

## 2014-08-03 NOTE — Assessment & Plan Note (Signed)
Reducing lisinopril dose for hypotension. Return with home bp monitor  for calibration .  Lab Results  Component Value Date   CREATININE 0.7 08/01/2014   Lab Results  Component Value Date   NA 140 08/01/2014   K 4.5 08/01/2014   CL 104 08/01/2014   CO2 29 08/01/2014

## 2014-08-03 NOTE — Assessment & Plan Note (Signed)
Aggravated by low BP.  Reducing lisinopril dose today to 2.5 mg for systolic > 140

## 2014-08-03 NOTE — Assessment & Plan Note (Signed)
Well-controlled on current medications.  hemoglobin A1c has been consistently on or under 7.0 . He is up-to-date on eye exams and his foot exam is normal . Insurance is requiring a change to humalog in January 2016. ,  Will use 50/50 as patient has had lows caused by too much long acting insulin.   Lab Results  Component Value Date   HGBA1C 6.5 08/01/2014   Lab Results  Component Value Date   MICROALBUR 0.4 05/23/2014

## 2014-08-03 NOTE — Assessment & Plan Note (Signed)
LDL and triglycerides have been at goal on current medications. He has no side effects and liver enzymes are normal. No changes today.  Lab Results  Component Value Date   CHOL 117 08/01/2014   HDL 49.20 08/01/2014   LDLCALC 57 08/01/2014   LDLDIRECT 53.0 12/16/2012   TRIG 53.0 08/01/2014   CHOLHDL 2 08/01/2014   Lab Results  Component Value Date   ALT 12 08/01/2014   AST 18 08/01/2014   ALKPHOS 44 08/01/2014   BILITOT 0.7 08/01/2014

## 2014-08-06 ENCOUNTER — Encounter: Payer: Self-pay | Admitting: *Deleted

## 2014-08-08 ENCOUNTER — Other Ambulatory Visit: Payer: Self-pay | Admitting: *Deleted

## 2014-08-08 ENCOUNTER — Other Ambulatory Visit: Payer: Self-pay | Admitting: Cardiovascular Disease

## 2014-08-08 MED ORDER — MICROLET LANCETS MISC: Status: DC

## 2014-08-08 NOTE — Telephone Encounter (Signed)
Fax from pharmacy, needing new ICD 10 with Rx. Rx sent to pharmacy by escript

## 2014-08-16 ENCOUNTER — Other Ambulatory Visit: Payer: Self-pay | Admitting: *Deleted

## 2014-08-16 MED ORDER — GLUCOSE BLOOD VI STRP: ORAL_STRIP | Status: DC

## 2014-08-16 NOTE — Telephone Encounter (Signed)
Fax from pharmacy, needing ICD 10 with Rx. Rx sent to pharmacy by escript

## 2014-09-24 ENCOUNTER — Ambulatory Visit: Payer: Self-pay | Admitting: Physician Assistant

## 2014-09-24 LAB — CBC WITH DIFFERENTIAL/PLATELET
BASOS PCT: 1 %
Basophil #: 0.1 10*3/uL (ref 0.0–0.1)
Eosinophil #: 0.1 10*3/uL (ref 0.0–0.7)
Eosinophil %: 1.5 %
HCT: 35.4 % — ABNORMAL LOW (ref 40.0–52.0)
HGB: 11.2 g/dL — AB (ref 13.0–18.0)
Lymphocyte #: 1.9 10*3/uL (ref 1.0–3.6)
Lymphocyte %: 30.9 %
MCH: 26.9 pg (ref 26.0–34.0)
MCHC: 31.6 g/dL — ABNORMAL LOW (ref 32.0–36.0)
MCV: 85 fL (ref 80–100)
MONO ABS: 0.7 x10 3/mm (ref 0.2–1.0)
MONOS PCT: 10.9 %
Neutrophil #: 3.5 10*3/uL (ref 1.4–6.5)
Neutrophil %: 55.7 %
Platelet: 272 10*3/uL (ref 150–440)
RBC: 4.15 10*6/uL — ABNORMAL LOW (ref 4.40–5.90)
RDW: 15.1 % — ABNORMAL HIGH (ref 11.5–14.5)
WBC: 6.2 10*3/uL (ref 3.8–10.6)

## 2014-09-24 LAB — BASIC METABOLIC PANEL
Anion Gap: 7 (ref 7–16)
BUN: 17 mg/dL (ref 7–18)
CALCIUM: 9.1 mg/dL (ref 8.5–10.1)
CHLORIDE: 102 mmol/L (ref 98–107)
CO2: 31 mmol/L (ref 21–32)
CREATININE: 0.81 mg/dL (ref 0.60–1.30)
Glucose: 96 mg/dL (ref 65–99)
OSMOLALITY: 281 (ref 275–301)
POTASSIUM: 4.1 mmol/L (ref 3.5–5.1)
Sodium: 140 mmol/L (ref 136–145)

## 2014-10-01 ENCOUNTER — Other Ambulatory Visit: Payer: Self-pay | Admitting: Internal Medicine

## 2014-10-01 ENCOUNTER — Other Ambulatory Visit: Payer: Self-pay

## 2014-10-01 MED ORDER — INSULIN ASPART PROT & ASPART (70-30 MIX) 100 UNIT/ML PEN: PEN_INJECTOR | SUBCUTANEOUS | Status: DC

## 2014-11-30 NOTE — Telephone Encounter (Signed)
This encounter was created in error - please disregard.

## 2014-12-04 DIAGNOSIS — H209 Unspecified iridocyclitis: Secondary | ICD-10-CM | POA: Insufficient documentation

## 2014-12-19 ENCOUNTER — Encounter: Payer: Self-pay | Admitting: Cardiovascular Disease

## 2014-12-19 ENCOUNTER — Ambulatory Visit (INDEPENDENT_AMBULATORY_CARE_PROVIDER_SITE_OTHER): Payer: Medicare Other | Admitting: Cardiovascular Disease

## 2014-12-19 VITALS — BP 130/62 | HR 54 | Ht 72.0 in | Wt 150.2 lb

## 2014-12-19 DIAGNOSIS — E785 Hyperlipidemia, unspecified: Secondary | ICD-10-CM

## 2014-12-19 DIAGNOSIS — H543 Unqualified visual loss, both eyes: Secondary | ICD-10-CM

## 2014-12-19 DIAGNOSIS — I739 Peripheral vascular disease, unspecified: Secondary | ICD-10-CM

## 2014-12-19 DIAGNOSIS — I4891 Unspecified atrial fibrillation: Secondary | ICD-10-CM

## 2014-12-19 DIAGNOSIS — I10 Essential (primary) hypertension: Secondary | ICD-10-CM

## 2014-12-19 DIAGNOSIS — R001 Bradycardia, unspecified: Secondary | ICD-10-CM | POA: Diagnosis not present

## 2014-12-19 NOTE — Progress Notes (Signed)
Patient ID: Chad Bailey, male    DOB: 06/27/1930, 81 y.o.   MRN: [id number]  HPI Comments: 81 year old gentleman, patient of Dr. Darrick Huntsmanullo, with a history of diabetes, hypertension, hyperlipidemia, long smoking history who stopped 10 years ago, Presenting initially in January 2012 with atrial fibrillation, started on anticoagulation who presents for routine follow up of his atrial fibrillation.  Previous stroke with loss of vision in his left eye. seen by ophthalmology who  referred him to neurology.  had embolic plaque as a cause of his vision problem.  In follow-up today, he reports having loss of vision in his right eye. Vision loss was rapid over the past 2-3 months. Otherwise is in good spirits  denies any tachycardia concerning for atrial fibrillation. He has come been compliant with his anticoagulation Denies having any excessive  bruising or bleeding He is monitoring blood pressure closely. Occasionally this drops very low less than 100. Denies having any symptoms concerning for orthostasis legs are getting weaker. Reports that he is going to start walking with his dog in the driveway    EKG shows sinus bradycardia rate 54 beats per minute, no significant ST or T-wave changes  Other past medical history  in atrial fibrillation in 2012, during an office visit.  He converted to normal sinus rhythm by the next visit to the office.   prior fall fall. He was leaning over to pick something up.  Balance is poor, he uses a cane.   He has been maintaining normal sinus rhythm on his last several clinic visits. Hemoglobin A1c 7.5 Total cholesterol 113, LDL 53  Old outside Echocardiogram suggested borderline normal ejection fraction of 50-55%, normal left atrial size, normal right ventricular systolic pressures, mild TR, mild MR otherwise essentially normal study.   Notes indicate Holter monitor was performed for 24 hours August 21, 2010 joint average heart rate 71 beats per minute. Report  suggests paroxysmal atrial fibrillation with primary rhythm being sinus rhythm with atrial flutter/fibrillation noted  ABIs  suggests possible occlusion of his left SFA, left ABI in the mild range 0.78   No Known Allergies  Outpatient Encounter Prescriptions as of 12/19/2014  Medication Sig  . aspirin 81 MG tablet Take 81 mg by mouth daily.    Marland Kitchen. atorvastatin (LIPITOR) 10 MG tablet TAKE ONE TABLET BY MOUTH ONCE DAILY  . Brinzolamide-Brimonidine (SIMBRINZA) 1-0.2 % SUSP Apply to eye 2 (two) times daily.  . cholecalciferol (VITAMIN D) 1000 UNITS tablet Take 1,000 Units by mouth daily.  . Difluprednate (DUREZOL) 0.05 % EMUL Apply to eye 2 (two) times daily.  . finasteride (PROSCAR) 5 MG tablet TAKE ONE TABLET BY MOUTH ONCE DAILY  . glucose blood test strip Check blood sugar twice daily as instructed. Pt has Contour Next EZ. Dx Y86.578E11.319  . Insulin Aspart Prot & Aspart (NOVOLOG MIX 70/30 FLEXPEN) (70-30) 100 UNIT/ML Pen INJECT 14 UNITS INTO THE SKIN TWICE DAILY WITH A MEAL.  Marland Kitchen. Insulin Lispro Prot & Lispro (HUMALOG MIX 50/50 KWIKPEN) (50-50) 100 UNIT/ML Kwikpen 14 units twice daily before breakfast and dinner  . lisinopril (PRINIVIL,ZESTRIL) 2.5 MG tablet Take 1 tablet (2.5 mg total) by mouth daily.  . metFORMIN (GLUCOPHAGE) 1000 MG tablet TAKE ONE TABLET BY MOUTH TWICE DAILY WITH A MEAL  . MICROLET LANCETS MISC USE ONE LANCET TWICE DAILY TO CHECK GLUCOSE LEVELS  . NOVOFINE 30G X 8 MM MISC USE 1 NEEDLE EACH TIME INSULIN IS INJECTED INTO THE SKIN  . PRADAXA 150 MG CAPS capsule  TAKE ONE CAPSULE BY MOUTH EVERY 12 HOURS  . tamsulosin (FLOMAX) 0.4 MG CAPS capsule TAKE ONE CAPSULE BY MOUTH ONCE DAILY  . timolol (BETIMOL) 0.5 % ophthalmic solution Apply 1 drop to eye 2 (two) times daily.  . travoprost, benzalkonium, (TRAVATAN) 0.004 % ophthalmic solution Apply 1 drop to eye at bedtime.     Past Medical History  Diagnosis Date  . Non-insulin dependent diabetes mellitus   . A-fib   . Hyperlipidemia    . Fracture of left ankle   . Hypertension   . History of tobacco abuse   . gouty arthritis   . Loss of vision 01/2013    left eye  . Thyroid nodule 2014  . Retinal artery thrombosis, left 2014    Past Surgical History  Procedure Laterality Date  . Left ankle   1996    s/p surgery with screws  . Lasik      bilateral eye surgery  . Eye surgery      right    Social History  reports that he quit smoking about 12 years ago. His smoking use included Cigarettes and Pipe. He has a 336 pack-year smoking history. He has quit using smokeless tobacco. His smokeless tobacco use included Chew. He reports that he does not drink alcohol or use illicit drugs.  Family History family history includes Heart disease in his mother.   Review of Systems  Constitutional: Negative.   Eyes: Positive for visual disturbance.  Respiratory: Negative.   Cardiovascular: Negative.   Gastrointestinal: Negative.   Musculoskeletal: Negative.   Skin: Negative.   Neurological: Negative.   Hematological: Negative.   Psychiatric/Behavioral: Negative.   All other systems reviewed and are negative.  BP 130/62 mmHg  Pulse 54  Ht 6' (1.829 m)  Wt 150 lb 4 oz (68.153 kg)  BMI 20.37 kg/m2  Physical Exam  Constitutional: He is oriented to person, place, and time. He appears well-developed and well-nourished.  HENT:  Head: Normocephalic.  Nose: Nose normal.  Mouth/Throat: Oropharynx is clear and moist.  Eyes: Conjunctivae are normal.  Neck: Normal range of motion. Neck supple. No JVD present.  Cardiovascular: Normal rate, S1 normal, S2 normal and intact distal pulses.  A regularly irregular rhythm present. Exam reveals no gallop and no friction rub.   Murmur heard.  Crescendo systolic murmur is present with a grade of 2/6  Pulmonary/Chest: Effort normal and breath sounds normal. No respiratory distress. He has no wheezes. He has no rales. He exhibits no tenderness.  Abdominal: Soft. Bowel sounds are  normal. He exhibits no distension. There is no tenderness.  Musculoskeletal: Normal range of motion. He exhibits no edema or tenderness.  Lymphadenopathy:    He has no cervical adenopathy.  Neurological: He is alert and oriented to person, place, and time. Coordination normal.  Skin: Skin is warm and dry. No rash noted. No erythema.  Psychiatric: He has a normal mood and affect. His behavior is normal. Judgment and thought content normal.      Assessment and Plan   Nursing note and vitals reviewed.

## 2014-12-19 NOTE — Assessment & Plan Note (Signed)
Maintaining normal sinus rhythm. On anticoagulation

## 2014-12-19 NOTE — Patient Instructions (Signed)
You are doing well. No medication changes were made.  Please call us if you have new issues that need to be addressed before your next appt.  Your physician wants you to follow-up in: 6 months.  You will receive a reminder letter in the mail two months in advance. If you don't receive a letter, please call our office to schedule the follow-up appointment.   

## 2014-12-19 NOTE — Assessment & Plan Note (Signed)
Heart rate by his measurements at home typically high 50s up to 60s. He is asymptomatic

## 2014-12-19 NOTE — Assessment & Plan Note (Signed)
Currently on aggressive lipid management. Denies having claudication symptoms

## 2014-12-19 NOTE — Assessment & Plan Note (Signed)
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 Less active given his visual limitations.. Wife is having to help him with ADLs.

## 2014-12-19 NOTE — Assessment & Plan Note (Signed)
Recommended he recheck his blood pressure when it runs low. Unable to exclude blood pressure cuff error. Otherwise typically runs 120 up to 140 systolic. No orthostatic symptoms

## 2014-12-19 NOTE — Assessment & Plan Note (Signed)
Cholesterol is at goal on the current lipid regimen. No changes to the medications were made.  

## 2014-12-27 ENCOUNTER — Other Ambulatory Visit: Payer: Self-pay | Admitting: Internal Medicine

## 2015-01-07 LAB — HM DIABETES EYE EXAM

## 2015-01-14 ENCOUNTER — Other Ambulatory Visit: Payer: Self-pay | Admitting: Cardiovascular Disease

## 2015-01-21 ENCOUNTER — Encounter: Payer: Self-pay | Admitting: Internal Medicine

## 2015-04-10 ENCOUNTER — Other Ambulatory Visit: Payer: Self-pay | Admitting: *Deleted

## 2015-04-10 MED ORDER — FINASTERIDE 5 MG PO TABS: ORAL_TABLET | ORAL | Status: DC

## 2015-05-28 ENCOUNTER — Other Ambulatory Visit: Payer: Self-pay | Admitting: Cardiovascular Disease

## 2015-05-28 ENCOUNTER — Other Ambulatory Visit: Payer: Self-pay | Admitting: Internal Medicine

## 2015-05-28 NOTE — Telephone Encounter (Signed)
Refill sent for atorvastatin & pradaxa.

## 2015-05-28 NOTE — Telephone Encounter (Signed)
Please advise on refills patient not seen since 08/01/14. Tried to call patient to schedule no answer

## 2015-05-29 NOTE — Telephone Encounter (Signed)
Refill for 30 days only.  OFFICE VISIT NEEDED prior to any more refills 

## 2015-05-30 ENCOUNTER — Other Ambulatory Visit: Payer: Self-pay | Admitting: *Deleted

## 2015-05-30 MED ORDER — MICROLET LANCETS MISC: Status: AC

## 2015-05-30 NOTE — Telephone Encounter (Signed)
Appoint scheduled

## 2015-06-18 ENCOUNTER — Ambulatory Visit (INDEPENDENT_AMBULATORY_CARE_PROVIDER_SITE_OTHER): Payer: Self-pay | Admitting: Internal Medicine

## 2015-06-18 ENCOUNTER — Telehealth: Payer: Self-pay | Admitting: Internal Medicine

## 2015-06-18 DIAGNOSIS — E11319 Type 2 diabetes mellitus with unspecified diabetic retinopathy without macular edema: Secondary | ICD-10-CM

## 2015-06-18 NOTE — Telephone Encounter (Signed)
Tried to call patient back and enquire as to records being sent to different provider? Line busy and cell number is turned off. FYI

## 2015-06-18 NOTE — Telephone Encounter (Signed)
Pt wife called not going to be able to make appt for today at 4:30p. Pt is aware of the charge for cancelling same day. Pt wife states about records being sent to a different provider.  Thank You!

## 2015-06-20 NOTE — Progress Notes (Signed)
Patient failed to keep scheduled 30  Minute appointment and will be charged a no show fee.

## 2015-06-20 NOTE — Assessment & Plan Note (Signed)
Patient failed to keep scheduled appointment and will be charged a no show fee.   

## 2015-06-30 DIAGNOSIS — H409 Unspecified glaucoma: Secondary | ICD-10-CM | POA: Insufficient documentation

## 2015-06-30 DIAGNOSIS — G629 Polyneuropathy, unspecified: Secondary | ICD-10-CM | POA: Insufficient documentation

## 2015-06-30 DIAGNOSIS — F17200 Nicotine dependence, unspecified, uncomplicated: Secondary | ICD-10-CM | POA: Insufficient documentation

## 2015-07-03 DIAGNOSIS — E538 Deficiency of other specified B group vitamins: Secondary | ICD-10-CM | POA: Insufficient documentation

## 2015-07-29 NOTE — Assessment & Plan Note (Signed)
Managed with aggressive statin therapy ,  Asymptomatic currently

## 2015-08-06 ENCOUNTER — Emergency Department: Payer: Medicare Other

## 2015-08-06 ENCOUNTER — Emergency Department
Admission: EM | Admit: 2015-08-06 | Discharge: 2015-08-06 | Disposition: A | Discharge status: Home / Self Care | Payer: Medicare Other | Attending: Emergency Medicine | Admitting: Emergency Medicine

## 2015-08-06 ENCOUNTER — Encounter: Payer: Self-pay | Admitting: Emergency Medicine

## 2015-08-06 DIAGNOSIS — I10 Essential (primary) hypertension: Secondary | ICD-10-CM | POA: Diagnosis not present

## 2015-08-06 DIAGNOSIS — Z794 Long term (current) use of insulin: Secondary | ICD-10-CM | POA: Diagnosis not present

## 2015-08-06 DIAGNOSIS — Y9289 Other specified places as the place of occurrence of the external cause: Secondary | ICD-10-CM | POA: Insufficient documentation

## 2015-08-06 DIAGNOSIS — Y9389 Activity, other specified: Secondary | ICD-10-CM | POA: Insufficient documentation

## 2015-08-06 DIAGNOSIS — Z79899 Other long term (current) drug therapy: Secondary | ICD-10-CM | POA: Insufficient documentation

## 2015-08-06 DIAGNOSIS — W01198A Fall on same level from slipping, tripping and stumbling with subsequent striking against other object, initial encounter: Secondary | ICD-10-CM | POA: Insufficient documentation

## 2015-08-06 DIAGNOSIS — S0990XA Unspecified injury of head, initial encounter: Secondary | ICD-10-CM | POA: Diagnosis present

## 2015-08-06 DIAGNOSIS — E119 Type 2 diabetes mellitus without complications: Secondary | ICD-10-CM | POA: Diagnosis not present

## 2015-08-06 DIAGNOSIS — Z7982 Long term (current) use of aspirin: Secondary | ICD-10-CM | POA: Diagnosis not present

## 2015-08-06 DIAGNOSIS — S299XXA Unspecified injury of thorax, initial encounter: Secondary | ICD-10-CM | POA: Diagnosis not present

## 2015-08-06 DIAGNOSIS — R51 Headache: Secondary | ICD-10-CM

## 2015-08-06 DIAGNOSIS — Y998 Other external cause status: Secondary | ICD-10-CM | POA: Insufficient documentation

## 2015-08-06 DIAGNOSIS — R3915 Urgency of urination: Secondary | ICD-10-CM

## 2015-08-06 DIAGNOSIS — Z87891 Personal history of nicotine dependence: Secondary | ICD-10-CM | POA: Diagnosis not present

## 2015-08-06 DIAGNOSIS — R519 Headache, unspecified: Secondary | ICD-10-CM

## 2015-08-06 DIAGNOSIS — R509 Fever, unspecified: Secondary | ICD-10-CM

## 2015-08-06 LAB — URINALYSIS COMPLETE WITH MICROSCOPIC (ARMC ONLY)
BILIRUBIN URINE: NEGATIVE
Bacteria, UA: NONE SEEN
Glucose, UA: 50 mg/dL — AB
HGB URINE DIPSTICK: NEGATIVE
Leukocytes, UA: NEGATIVE
Nitrite: NEGATIVE
PH: 5 (ref 5.0–8.0)
PROTEIN: 30 mg/dL — AB
Specific Gravity, Urine: 1.019 (ref 1.005–1.030)
Squamous Epithelial / LPF: NONE SEEN

## 2015-08-06 LAB — CBC
HCT: 34.4 % — ABNORMAL LOW (ref 40.0–52.0)
HEMOGLOBIN: 11.2 g/dL — AB (ref 13.0–18.0)
MCH: 27.6 pg (ref 26.0–34.0)
MCHC: 32.6 g/dL (ref 32.0–36.0)
MCV: 84.5 fL (ref 80.0–100.0)
PLATELETS: 261 10*3/uL (ref 150–440)
RBC: 4.07 MIL/uL — AB (ref 4.40–5.90)
RDW: 16.6 % — ABNORMAL HIGH (ref 11.5–14.5)
WBC: 9.3 10*3/uL (ref 3.8–10.6)

## 2015-08-06 LAB — COMPREHENSIVE METABOLIC PANEL
ALT: 13 U/L — ABNORMAL LOW (ref 17–63)
AST: 23 U/L (ref 15–41)
Albumin: 3.9 g/dL (ref 3.5–5.0)
Alkaline Phosphatase: 41 U/L (ref 38–126)
Anion gap: 8 (ref 5–15)
BUN: 12 mg/dL (ref 6–20)
CALCIUM: 8.9 mg/dL (ref 8.9–10.3)
CHLORIDE: 101 mmol/L (ref 101–111)
CO2: 25 mmol/L (ref 22–32)
CREATININE: 0.71 mg/dL (ref 0.61–1.24)
Glucose, Bld: 147 mg/dL — ABNORMAL HIGH (ref 65–99)
POTASSIUM: 4.3 mmol/L (ref 3.5–5.1)
Sodium: 134 mmol/L — ABNORMAL LOW (ref 135–145)
Total Bilirubin: 0.9 mg/dL (ref 0.3–1.2)
Total Protein: 6.7 g/dL (ref 6.5–8.1)

## 2015-08-06 MED ORDER — LEVOFLOXACIN 500 MG PO TABS: 500.0000 mg | ORAL_TABLET | Freq: Every day | ORAL | Status: AC

## 2015-08-06 MED ORDER — LEVOFLOXACIN 750 MG PO TABS
750.0000 mg | ORAL_TABLET | Freq: Once | ORAL | Status: AC
  Administered 2015-08-06: 750 mg via ORAL
  Filled 2015-08-06: qty 1

## 2015-08-06 MED ORDER — ACETAMINOPHEN 500 MG PO TABS
1000.0000 mg | ORAL_TABLET | Freq: Once | ORAL | Status: AC
  Administered 2015-08-06: 1000 mg via ORAL
  Filled 2015-08-06: qty 2

## 2015-08-06 NOTE — ED Notes (Addendum)
Pt to ed with c/o back pain, urinary frequency, headache and fever x 2 days.  Pt states headache started yesterday afternoon and then while attempting to get onto toilet last night he slipped and fell hitting the right side of head, denies loss of consciousness with fall. Today with chills and body aches.

## 2015-08-06 NOTE — ED Provider Notes (Signed)
[facility] Emergency Department Provider Note  ____________________________________________  Time seen: [id number]  I have reviewed the triage vital signs and the nursing notes.   HISTORY  Chief Complaint Back Pain; Fever; Headache; and Urinary Frequency     HPI Chad Bailey is a 81 y.o. male who lives at home with his wife and adult son. He started having a severe headache yesterday later in the day. He went to bed early because of this. He also has had some increased urinary frequency. He got up in the middle the night pain and fell. When he fell he hit his head, had no loss of consciousness. Today, he has had ongoing severe headache and decreased mobility. He has also had increased urinary frequency continuing as well and presents the emergency department with a temperature of 101.8. He has had a fever as well Due to the difficulty with mobility, the family has been trying to help him move around by using a wheelchair that he has but seldom uses.  The headache is primarily in the right side of the head. He also has pain that extends down his back.  The patient did [place] the [place] this weekend. He has had increased stress and activity. The family thinks there is a chance that, due to this travel and weather exposure, he may have picked up a cold.    Past Medical History  Diagnosis Date  . Non-insulin dependent diabetes mellitus   . A-fib (HCC)   . Hyperlipidemia   . Fracture of left ankle   . Hypertension   . History of tobacco abuse   . gouty arthritis   . Loss of vision 01/2013    left eye  . Thyroid nodule 2014  . Retinal artery thrombosis, left 2014    Patient Active Problem List   Diagnosis Date Noted  . Vision loss, bilateral 12/19/2014  . Preoperative cardiovascular examination 06/18/2014  . Bradycardia 06/18/2014  . Orthostasis 03/22/2013  . Peripheral vascular disease in diabetes mellitus (HCC) 03/01/2013  . CVA (cerebral vascular  accident) (HCC) 03/01/2013  . 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 History of tobacco abuse   . gouty arthritis   . Hyperlipidemia 11/04/2010  . HYPERTENSION, BENIGN 11/04/2010  . Atrial fibrillation (HCC) 11/04/2010    Past Surgical History  Procedure Laterality Date  . Left ankle   1996    s/p surgery with screws  . Lasik      bilateral eye surgery  . Eye surgery      right    Current Outpatient Rx  Name  Route  Sig  Dispense  Refill  . aspirin 81 MG tablet   Oral   Take 81 mg by mouth daily.           Marland Kitchen. atorvastatin (LIPITOR) 10 MG tablet      TAKE ONE TABLET BY MOUTH ONCE DAILY   90 tablet   2   . Brinzolamide-Brimonidine (SIMBRINZA) 1-0.2 % SUSP   Ophthalmic   Apply to eye 2 (two) times daily.         . cholecalciferol (VITAMIN D) 1000 UNITS tablet   Oral   Take 1,000 Units by mouth daily.         . Difluprednate (DUREZOL) 0.05 % EMUL   Ophthalmic   Apply to eye 2 (two) times daily.         . finasteride (PROSCAR) 5 MG tablet  TAKE ONE TABLET BY MOUTH ONCE DAILY   30 tablet   0   . glucose blood test strip      Check blood sugar twice daily as instructed. Pt has Contour Next EZ. Dx E11.319   100 each   5   . Insulin Aspart Prot & Aspart (NOVOLOG MIX 70/30 FLEXPEN) (70-30) 100 UNIT/ML Pen      INJECT 14 UNITS INTO THE SKIN TWICE DAILY WITH A MEAL.   15 mL   1   . Insulin Lispro Prot & Lispro (HUMALOG MIX 50/50 KWIKPEN) (50-50) 100 UNIT/ML Kwikpen      14 units twice daily before breakfast and dinner   15 mL   11   . levofloxacin (LEVAQUIN) 500 MG tablet   Oral   Take 1 tablet (500 mg total) by mouth daily.   6 tablet   0   . lisinopril (PRINIVIL,ZESTRIL) 2.5 MG tablet   Oral   Take 1 tablet (2.5 mg total) by mouth daily.   30 tablet   1   . metFORMIN (GLUCOPHAGE) 1000 MG tablet      TAKE ONE TABLET BY MOUTH TWICE DAILY WITH MEALS   60 tablet   0    . MICROLET LANCETS MISC      USE ONE LANCET TWICE DAILY  Dx code:  E11.9   100 each   0   . NOVOFINE 30G X 8 MM MISC      USE ONE NEEDLE EACH TIME INSULIN IS INJECTED INTO THE SKIN   200 each   0   . PRADAXA 150 MG CAPS capsule      TAKE ONE CAPSULE BY MOUTH EVERY 12 HOURS   60 capsule   2   . tamsulosin (FLOMAX) 0.4 MG CAPS capsule      TAKE ONE CAPSULE BY MOUTH ONCE DAILY   90 capsule   0   . timolol (BETIMOL) 0.5 % ophthalmic solution   Ophthalmic   Apply 1 drop to eye 2 (two) times daily.         . travoprost, benzalkonium, (TRAVATAN) 0.004 % ophthalmic solution   Ophthalmic   Apply 1 drop to eye at bedtime.            Allergies Review of patient's allergies indicates no known allergies.  Family History  Problem Relation Age of Onset  . Heart disease Mother     Social History Social History  Substance Use Topics  . Smoking status: Former Smoker -- 6.00 packs/day for 56 years    Types: Cigarettes, Pipe    Quit date: 10/05/2002  . Smokeless tobacco: Former Neurosurgeon    Types: Chew     Comment: smoked 6 pipes/day;smoked cigarettes 1/2 PPD ,quit chewing tobacco in 2004  . Alcohol Use: No    Review of Systems  Constitutional: Positive for fever. ENT: Negative for sore throat. Cardiovascular: Negative for chest pain. Respiratory: Negative for cough. Gastrointestinal: Negative for abdominal pain, vomiting and diarrhea. Genitourinary: Increased urinary frequency.. Musculoskeletal: No myalgias or injuries. Skin: Negative for rash. Neurological: Positive for headache and diffuse weakness. No focal weakness.   10-point ROS otherwise negative.  ____________________________________________   PHYSICAL EXAM:  VITAL SIGNS: ED Triage Vitals  Enc Vitals Group     BP 08/06/15 1848 153/67 mmHg     Pulse Rate 08/06/15 1848 75     Resp 08/06/15 1848 18     Temp 08/06/15 1848 99.5 F (37.5 C)     Temp Source  08/06/15 1848 Oral     SpO2 08/06/15 1848  100 %     Weight 08/06/15 1848 150 lb (68.04 kg)     Height 08/06/15 1848  (1.778 m)     Head Cir --      Peak Flow --      Pain Score 08/06/15 1858 10     Pain Loc --      Pain Edu? --      Excl. in GC? --     Constitutional: Alert and communicative, but he defers to his family to provide much of the history, as he appears uncomfortable. ENT   Head: Normocephalic and atraumatic.   Nose: No congestion/rhinnorhea.       Mouth: No erythema, no swelling   Cardiovascular: Normal rate, regular rhythm, no murmur noted Respiratory:  Normal respiratory effort, no tachypnea.    Breath sounds are clear and equal bilaterally.  Gastrointestinal: Soft and nontender. No distention.  Back: General soreness through his back. Mild tenderness. Musculoskeletal: No deformity noted. Nontender with normal range of motion in all extremities.  No noted edema. Neurologic:  Normal appearing spontaneous movement in all 4 extremities. No gross focal neurologic deficits are appreciated. Sensation appears to be equal bilaterally and legs and arms. Skin:  Skin is warm, dry. No rash noted. Psychiatric: Mood and affect are normal. Speech and behavior are normal.  ____________________________________________    LABS (pertinent positives/negatives)  Labs Reviewed  COMPREHENSIVE METABOLIC PANEL - Abnormal; Notable for the following:    Sodium 134 (*)    Glucose, Bld 147 (*)    ALT 13 (*)    All other components within normal limits  CBC - Abnormal; Notable for the following:    RBC 4.07 (*)    Hemoglobin 11.2 (*)    HCT 34.4 (*)    RDW 16.6 (*)    All other components within normal limits  URINALYSIS COMPLETEWITH MICROSCOPIC ([facility] ONLY) - Abnormal; Notable for the following:    Color, Urine YELLOW (*)    APPearance CLEAR (*)    Glucose, UA 50 (*)    Ketones, ur 1+ (*)    Protein, ur 30 (*)    All other components within normal limits  URINE CULTURE  CULTURE, BLOOD (ROUTINE X 2)  CULTURE,  BLOOD (ROUTINE X 2)     ____________________________________________  ____________________________________________    RADIOLOGY  Chest x-ray:  IMPRESSION: No radiographic evidence of acute cardiopulmonary disease.  CT head:  IMPRESSION: Prior infarct mid right frontal lobe, stable. Mild atrophy with patchy periventricular small vessel disease, stable. No acute infarct evident. No hemorrhage or mass effect. Areas of paranasal sinus disease.   ____________________________________________ ____________________________________________   INITIAL IMPRESSION / ASSESSMENT AND PLAN / ED COURSE  Pertinent labs & imaging results that were available during my care of the patient were reviewed by me and considered in my medical decision making (see chart for details).  81 year old male with headache, back pain, increased urinary frequency, and fever. CT head, labs, and urinalysis pending.  ----------------------------------------- 9:01 PM on 08/06/2015 -----------------------------------------  Blood tests overall look reasonable. White blood's account 9.3, hemoglobin of 11.2. Renal function and electrolytes are overall reasonable. Urine does not show any sign of infection. Head CT was obtained which notes a prior infarct in the mid right frontal lobe. This appears stable.  ----------------------------------------- 9:42 PM on 08/06/2015 -----------------------------------------  On reexamination, the patient looks much better. He is calmer, he reports he feels better. The headache has improved.  He is much more communicative than he was earlier area he is eager to go home.  I have discussed the overall negative lab tests and urinalysis with the patient and his family. Given the fever and the notable headache, I have had some concern for meningitis, though my suspicion is low. I discussed this with the patient's family and spoke about a lumbar puncture. I do not think the patient  needs a lumbar puncture and, in addition, the patient is on Pradaxa, which I would recognize as a contraindication given the notably low suspicion for meningitis that I have.  The chest x-ray looks normal without sign of infiltrate.  Given the patient's impressive improvement with just Tylenol and his overall negative evaluation, we will discharge him home advising him to continue fever control and to follow with his primary physician. Due to the reported incontinence and some urinary urgency, we will place him on an antibiotic that can care for her UTI. I will prescribe Levaquin for him and given the first dose here in the emergency department. Urine and blood cultures are pending. ____________________________________________   FINAL CLINICAL IMPRESSION(S) / ED DIAGNOSES  Final diagnoses:  Fever, unspecified fever cause  Acute nonintractable headache, unspecified headache type  Urinary urgency      Darien Ramus, MD 08/06/15 2210

## 2015-08-06 NOTE — Discharge Instructions (Signed)
It is unclear what is causing your fever, but given urination urinary urgency and some incontinence, we are still concerned about a possible urinary tract infection. Due to this, we are prescribing Levaquin. This alleviated good coverage for urine tract or pulmonary infections. Given the headache you had, we also discussed meningitis, but this seemed unlikely given your clinical appearance as well as your improvement in the emergency department.  Follow-up with your regular doctor. Continue to use Tylenol to control fever as needed. Return to the emergency department if your headache worsens again, if he feel weaker, if you have uncontrolled fever, or if you have other urgent concerns.  Fever, Adult A fever is an increase in the body's temperature. It is usually defined as a temperature of 100F (38C) or higher. Brief mild or moderate fevers generally have no long-term effects, and they often do not require treatment. Moderate or high fevers may make you feel uncomfortable and can sometimes be a sign of a serious illness or disease. The sweating that may occur with repeated or prolonged fever may also cause dehydration. Fever is confirmed by taking a temperature with a thermometer. A measured temperature can vary with:  Age.  Time of day.  Location of the thermometer:  Mouth (oral).  Rectum (rectal).  Ear (tympanic).  Underarm (axillary).  Forehead (temporal). HOME CARE INSTRUCTIONS Pay attention to any changes in your symptoms. Take these actions to help with your condition:  Take over-the counter and prescription medicines only as told by your health care provider. Follow the dosing instructions carefully.  If you were prescribed an antibiotic medicine, take it as told by your health care provider. Do not stop taking the antibiotic even if you start to feel better.  Rest as needed.  Drink enough fluid to keep your urine clear or pale yellow. This helps to prevent  dehydration.  Sponge yourself or bathe with room-temperature water to help reduce your body temperature as needed. Do not use ice water.  Do not overbundle yourself in blankets or heavy clothes. SEEK MEDICAL CARE IF:  You vomit.  You cannot eat or drink without vomiting.  You have diarrhea.  You have pain when you urinate.  Your symptoms do not improve with treatment.  You develop new symptoms.  You develop excessive weakness. SEEK IMMEDIATE MEDICAL CARE IF:  You have shortness of breath or have trouble breathing.  You are dizzy or you faint.  You are disoriented or confused.  You develop signs of dehydration, such as a dry mouth, decreased urination, or paleness.  You develop severe pain in your abdomen.  You have persistent vomiting or diarrhea.  You develop a skin rash.  Your symptoms suddenly get worse.   This information is not intended to replace advice given to you by your health care provider. Make sure you discuss any questions you have with your health care provider.   Document Released: 03/17/2001 Document Revised: 06/12/2015 Document Reviewed: 11/15/2014 Elsevier Interactive Patient Education [employer]2016 Elsevier Inc.

## 2015-08-08 LAB — URINE CULTURE

## 2015-08-11 LAB — CULTURE, BLOOD (ROUTINE X 2)
Culture: NO GROWTH
Culture: NO GROWTH

## 2015-08-12 DIAGNOSIS — D649 Anemia, unspecified: Secondary | ICD-10-CM | POA: Insufficient documentation

## 2015-08-14 ENCOUNTER — Telehealth: Payer: Self-pay | Admitting: Cardiovascular Disease

## 2015-08-14 NOTE — Telephone Encounter (Signed)
3 attempts made to schedule fu from recall list.   LMOV to call office .   Deleting Recall.  °

## 2015-08-15 ENCOUNTER — Ambulatory Visit (INDEPENDENT_AMBULATORY_CARE_PROVIDER_SITE_OTHER): Payer: Medicare Other | Admitting: Surgery

## 2015-08-15 ENCOUNTER — Encounter: Payer: Self-pay | Admitting: Surgery

## 2015-08-15 VITALS — BP 128/74 | HR 84 | Temp 98.5°F | Ht 69.0 in | Wt 150.0 lb

## 2015-08-15 DIAGNOSIS — G8929 Other chronic pain: Secondary | ICD-10-CM

## 2015-08-15 DIAGNOSIS — R51 Headache: Secondary | ICD-10-CM

## 2015-08-15 NOTE — Progress Notes (Signed)
Surgical Consultation  08/15/2015  Chad Bailey is an 81 y.o. male.   CC patient referred over for temporal artery biopsy for possible temporal arteritis  HPI:  Patient complains of right-sided headaches and occasional left-sided headaches that are been going on for 2 weeks during that timeframe he has experienced rapidly declining motor skills and strength. His son and  Wife are here with him and described a rapid progression over 2 weeks of weakness and falls inability to eat well weight loss without fevers or chills. He was in the emergency room and they contemplated a spinal tap for possible meningitis. He was placed on Levaquin at that time but has stopped taking it. They say that he may have improved slightly when he was on the Levaquin. He's never had an episode like this before. As not seen a neurologist. His CAT scan suggests an old frontal stroke. There is no sign of acute hemorrhage. He is on Pradaxa.He is having some urinary incontinence and this is new since two weeks. 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  Past Medical History  Diagnosis Date  . Non-insulin dependent diabetes mellitus   . A-fib (HCC)   . Hyperlipidemia   . Fracture of left ankle   . Hypertension   . History of tobacco abuse   . gouty arthritis   . Loss of vision 01/2013    left eye  . Thyroid nodule 2014  . Retinal artery thrombosis, left 2014    Past Surgical History  Procedure Laterality Date  . Left ankle   1996    s/p surgery with screws  . Lasik      bilateral eye surgery  . Eye surgery      right    Family History  Problem Relation Age of Onset  . Heart disease Mother     Social History:  reports that he quit smoking about 12 years ago. His smoking use included Cigarettes and Pipe. He has a 336 pack-year smoking history. He has quit using smokeless tobacco. His smokeless tobacco use included Chew. He reports that he does not drink alcohol or use illicit drugs.  Allergies: No Known  Allergies  Medications reviewed.   Review of Systems:   Review of Systems  Unable to perform ROS: age     Physical Exam:  BP 128/74 mmHg  Pulse 84  Temp(Src) 98.5 F (36.9 C) (Oral)  Physical Exam  Constitutional: No distress.  Very thin very weak in a wheelchair pulses head down supported by one arm.  HENT:  Head: Normocephalic and atraumatic.  Palpable pulses at both tragus  Eyes: Right eye exhibits no discharge. Left eye exhibits no discharge. No scleral icterus.  Neck: Neck supple.  Cardiovascular: Normal rate, regular rhythm and normal heart sounds.   Pulmonary/Chest: Effort normal and breath sounds normal. No respiratory distress. He has no wheezes. He has no rales.  Musculoskeletal: He exhibits edema. He exhibits no tenderness.  Wheelchair Area week  Lymphadenopathy:    He has no cervical adenopathy.  Skin: Skin is warm and dry.      No results found for this or any previous visit (from the past 48 hour(s)). No results found.  Assessment/Plan:  Over from internal medicine for a temporal artery biopsy for possible temporal arteritis due to his headaches. However on questioning of he and the family it becomes clear that over the last 2 weeks she's had a rapid progression of weakness falls and equilibrium problems suggestive of a  more global neurologic event. A CT scan which is personally reviewed shows no sign of acute process but there has been an old stroke in the frontal lobe. Patient with global symptoms of neurologic difficulty. I seriously doubt that this is temporal arteritis and even if it was it would probably only explain his headaches and not all of the other global neurologic symptoms.. I spoke with Dr. Audelia Actonheis concerning his care and then I spoke again with the family. I believe this patient would be better served with a consultation from a neurologist and possibly a rheumatologist and if temporal artery biopsy is needed he can be performed in the future.  However this point he is already blind and his headaches of been going on for probably a lot longer than what was originally described. Over our was spent discussing and reviewing and in direct patient care face-to-face with this patient and family. The family is in agreement that they will pursue cardiology consultation today, and Dr. Frederica Kusterheiss is arranging for a neurology and possibly rheumatology consult as quick as possible. Patient will follow-up with me on an as-needed basis or if temporal artery biopsy is indicated by any other specialists we can provide that at any time.   Lattie Hawichard E Delila Kuklinski, MD, FACS

## 2015-08-21 ENCOUNTER — Other Ambulatory Visit: Payer: Self-pay | Admitting: Internal Medicine

## 2015-08-23 ENCOUNTER — Ambulatory Visit (INDEPENDENT_AMBULATORY_CARE_PROVIDER_SITE_OTHER): Payer: Medicare Other | Admitting: Physician Assistant

## 2015-08-23 ENCOUNTER — Encounter: Payer: Self-pay | Admitting: Physician Assistant

## 2015-08-23 VITALS — BP 114/73 | HR 82 | Ht 68.0 in | Wt 150.0 lb

## 2015-08-23 DIAGNOSIS — R0602 Shortness of breath: Secondary | ICD-10-CM | POA: Diagnosis not present

## 2015-08-23 DIAGNOSIS — R519 Headache, unspecified: Secondary | ICD-10-CM

## 2015-08-23 DIAGNOSIS — R531 Weakness: Secondary | ICD-10-CM

## 2015-08-23 DIAGNOSIS — E785 Hyperlipidemia, unspecified: Secondary | ICD-10-CM

## 2015-08-23 DIAGNOSIS — H543 Unqualified visual loss, both eyes: Secondary | ICD-10-CM | POA: Diagnosis not present

## 2015-08-23 DIAGNOSIS — I480 Paroxysmal atrial fibrillation: Secondary | ICD-10-CM

## 2015-08-23 DIAGNOSIS — R51 Headache: Secondary | ICD-10-CM

## 2015-08-23 DIAGNOSIS — I10 Essential (primary) hypertension: Secondary | ICD-10-CM

## 2015-08-23 MED ORDER — METOPROLOL TARTRATE 25 MG PO TABS: 12.5000 mg | ORAL_TABLET | Freq: Two times a day (BID) | ORAL | Status: DC

## 2015-08-23 MED ORDER — METOPROLOL TARTRATE 25 MG PO TABS: 25.0000 mg | ORAL_TABLET | Freq: Two times a day (BID) | ORAL | Status: DC

## 2015-08-23 NOTE — Patient Instructions (Addendum)
Medication Instructions:  Your physician has recommended you make the following change in your medication:  STOP lisinopril START taking metoprolol 12.5mg  twice per day. Do not take if systolic BP <100   Labwork: Sed rate, CK, CBC, TSH, CMP  Testing/Procedures: Your physician has requested that you have an echocardiogram. Echocardiography is a painless test that uses sound waves to create images of your heart. It provides your doctor with information about the size and shape of your heart and how well your heart's chambers and valves are working. This procedure takes approximately one hour. There are no restrictions for this procedure.    Follow-Up: Your physician recommends that you schedule a follow-up appointment in: three months with Dr. Mariah MillingGollan   Any Other Special Instructions Will Be Listed Below (If Applicable).     If you need a refill on your cardiac medications before your next appointment, please call your pharmacy.  Echocardiogram An echocardiogram, or echocardiography, uses sound waves (ultrasound) to produce an image of your heart. The echocardiogram is simple, painless, obtained within a short period of time, and offers valuable information to your health care provider. The images from an echocardiogram can provide information such as:  Evidence of coronary artery disease (CAD).  Heart size.  Heart muscle function.  Heart valve function.  Aneurysm detection.  Evidence of a past heart attack.  Fluid buildup around the heart.  Heart muscle thickening.  Assess heart valve function. LET [facility] Ltd[facility]OUR HEALTH CARE PROVIDER KNOW ABOUT:  Any allergies you have.  All medicines you are taking, including vitamins, herbs, eye drops, creams, and over-the-counter medicines.  Previous problems you or members of your family have had with the use of anesthetics.  Any blood disorders you have.  Previous surgeries you have had.  Medical conditions you have.  Possibility  of pregnancy, if this applies. BEFORE THE PROCEDURE  No special preparation is needed. Eat and drink normally.  PROCEDURE   In order to produce an image of your heart, gel will be applied to your chest and a wand-like tool (transducer) will be moved over your chest. The gel will help transmit the sound waves from the transducer. The sound waves will harmlessly bounce off your heart to allow the heart images to be captured in real-time motion. These images will then be recorded.  You may need an IV to receive a medicine that improves the quality of the pictures. AFTER THE PROCEDURE You may return to your normal schedule including diet, activities, and medicines, unless your health care provider tells you otherwise.   This information is not intended to replace advice given to you by your health care provider. Make sure you discuss any questions you have with your health care provider.   Document Released: 09/18/2000 Document Revised: 10/12/2014 Document Reviewed: 05/29/2013 Elsevier Interactive Patient Education [employer]2016 Elsevier Inc.

## 2015-08-23 NOTE — Progress Notes (Signed)
Cardiology Office Note:  Date of Encounter: 08/23/2015  ID: Chad Bailey, DOB [birth year], MRN [id number]  PCP:  Sherlene Shams, MD Primary Cardiologist:  Dr. Mariah Milling, MD  Chief Complaint  Patient presents with  . other    6 month follow up. Meds reviewed by the patient verbally. Pt. was at [facility] with a fall on Nov. 1, 2016. Pt. c/o frequent headaches, muscle weakness and irreg. heart beats.     HPI:  81 year old male with history of PAF on Pradaxa initially found in January 2012, prior stroke with resultant vision loss of the left eye 2/2 embolic plaque in 2014, DM2, HTN, HLD, prior long history of tobacco abuse who stopped many years ago, now having vision loss in his right eye, and PVD who presents for routine follow up.   Prior notes indicate patient wore a 24 hour Holter monitor on 08/21/2010 with an average HR of 71 bpm. Report suggested PAF with primary rhythm being sinus rhythm with atrial fib/flutter noted. Notes do not indicate if he was started on anticoagulation at that time or not. Limited echo from 08/2010 showed EF 50-55%, normal diastolic function, mild MR/TR, normal sized left atritum, PASP 25-30 mm Hg. He presented in January 2012 in Afib, was started on anticoagulation at that time. At his next office visit he was back in sinus rhythm. 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 He has subsequently had vision loss to the right eye as well and is esstenitally blind currently with his wife performing his ADLs. At his last follow up on 12/19/2014 he denied any tacycardic rates concerning for Afib and had been compliant with his anticoagulation. He had been monitoring his BP closely with occasional drops below 100 systolic, he was asymptomatic. He had been getting weaker. He was continued on his current medications.   He was recently seen on 11/1 with severe headache, back pain, fever, and urinary frequency. CT head was  negative for acute infarct. Labs was unrevealing for etiology including CBC, UA and chemistry. Given he was on Pradaxa and overall low suspicion for meningitis LP was deferred. He was placed on empiric ABX at the ED. He followed up with general surgery on 11/10 for possible tempral artery biopsy, though this was not felt to be indicated as he symptoms were felt to be of neurologic in nature. He was advised to see neurology, rheumatology, and cardiology.   He continues to have daily headaches once the Tylenol wanes with associated myalgias, arthralgias, and generalized fatigue. His appetite has decreased. He has an appointment with neurology for 11/29. He has not had any chest pain. His family has noticed episodes of fast irregular heart rates, with a max heart rate of 119 bpm on 11/14. His previous resting heart rate was in the 50s to 60s, however in the setting of all of the above stressors his resting heart rate has increased to the 90s at baseline. He is at times SOB. He denies any palpitations.      Past Medical History  Diagnosis Date  . Non-insulin dependent diabetes mellitus   . PAF (paroxysmal atrial fibrillation) (HCC)     a. on pradaxa; b. CHADS2VASc at least 7 (HTN, age x 2, DM, stroke, vascular disease)  . Hyperlipidemia   . Fracture of left ankle   . Hypertension   . History of tobacco abuse   . gouty arthritis   . Loss of vision  01/2013    left eye  . Thyroid nodule 2014  . Retinal artery thrombosis, left 2014  :  Past Surgical History  Procedure Laterality Date  . Left ankle   1996    s/p surgery with screws  . Lasik      bilateral eye surgery  . Eye surgery      right  :  Social History:  The patient  reports that he quit smoking about 12 years ago. His smoking use included Cigarettes and Pipe. He has a 336 pack-year smoking history. He has quit using smokeless tobacco. His smokeless tobacco use included Chew. He reports that he does not drink alcohol or use illicit  drugs.   Family History  Problem Relation Age of Onset  . Heart disease Mother      Allergies:  No Known Allergies   Home Medications:  Current Outpatient Prescriptions  Medication Sig Dispense Refill  . acyclovir (ZOVIRAX) 400 MG tablet Take 800 mg by mouth 2 (two) times daily.     Marland Kitchen. aspirin 81 MG tablet Take 81 mg by mouth daily.      . Brinzolamide-Brimonidine (SIMBRINZA) 1-0.2 % SUSP Apply to eye 2 (two) times daily.    . cholecalciferol (VITAMIN D) 1000 UNITS tablet Take 1,000 Units by mouth daily.    . Difluprednate (DUREZOL) 0.05 % EMUL Apply to eye 2 (two) times daily.    . finasteride (PROSCAR) 5 MG tablet TAKE ONE TABLET BY MOUTH ONCE DAILY 30 tablet 0  . glucose blood test strip Check blood sugar twice daily as instructed. Pt has Contour Next EZ. Dx E11.319 100 each 5  . Insulin Aspart Prot & Aspart (NOVOLOG MIX 70/30 FLEXPEN) (70-30) 100 UNIT/ML Pen INJECT 14 UNITS INTO THE SKIN TWICE DAILY WITH A MEAL. 15 mL 1  . metFORMIN (GLUCOPHAGE) 1000 MG tablet TAKE ONE TABLET BY MOUTH TWICE DAILY WITH MEALS 60 tablet 0  . MICROLET LANCETS MISC USE ONE LANCET TWICE DAILY  Dx code:  E11.9 100 each 0  . NOVOFINE 30G X 8 MM MISC USE ONE NEEDLE EACH TIME INSULIN IS INJECTED INTO THE SKIN 200 each 0  . PRADAXA 150 MG CAPS capsule TAKE ONE CAPSULE BY MOUTH EVERY 12 HOURS 60 capsule 2  . tamsulosin (FLOMAX) 0.4 MG CAPS capsule TAKE ONE CAPSULE BY MOUTH ONCE DAILY 90 capsule 3  . timolol (BETIMOL) 0.5 % ophthalmic solution Apply 1 drop to eye 2 (two) times daily.    . travoprost, benzalkonium, (TRAVATAN) 0.004 % ophthalmic solution Apply 1 drop to eye at bedtime.     . metoprolol tartrate (LOPRESSOR) 25 MG tablet Take 0.5 tablets (12.5 mg total) by mouth 2 (two) times daily. 30 tablet 3   No current facility-administered medications for this visit.     Review of Systems:  Review of Systems  Constitutional: Positive for weight loss and malaise/fatigue. Negative for fever, chills and  diaphoresis.  HENT: Negative for congestion, ear discharge, ear pain, hearing loss, nosebleeds, sore throat and tinnitus.   Eyes: Negative for pain, discharge and redness.       Patient is blind  Respiratory: Positive for shortness of breath. Negative for cough, hemoptysis, sputum production, wheezing and stridor.   Cardiovascular: Negative for chest pain, palpitations, orthopnea, claudication, leg swelling and PND.  Gastrointestinal: Negative for nausea, vomiting and abdominal pain.  Genitourinary: Positive for frequency.  Musculoskeletal: Positive for myalgias, back pain, joint pain, falls and neck pain.  Skin: Negative for itching and rash.  Neurological: Positive for dizziness, weakness and headaches. Negative for tingling, sensory change, speech change, focal weakness, seizures and loss of consciousness.  Endo/Heme/Allergies: Bruises/bleeds easily.  Psychiatric/Behavioral: Negative for substance abuse. The patient is not nervous/anxious.   All other systems reviewed and are negative.    Physical Exam:  Blood pressure 114/73, pulse 82, height  (1.727 m), weight 150 lb (68.04 kg). BMI: Body mass index is 22.81 kg/(m^2). General: Pleasant, NAD. Psych: Normal affect. Responds to questions with normal affect.  Neuro: Alert and oriented X 3. Moves all extremities spontaneously. HEENT: Normocephalic, atraumatic. EOM intact. Sclera anicteric.  Neck: Trachea midline. Supple without bruits or JVD. Lungs:  Respirations regular and unlabored. CTA bilaterally without wheezing, crackles, or rhonchi.  Heart: Irregularly-irregular, normal s3, s4. II/VI systolic murmur. No rubs, or gallops.  Abdomen: Soft, non-tender, non-distended, BS + x 4.  Extremities: No clubbing, cyanosis or edema. DP/PT/Radials 2+ and equal bilaterally.   Accessory Clinical Findings:  EKG: Afib, 82 bpm, left axis deviation, poor R wave progression, no significant st/t changes   Recent Labs: 08/06/2015: ALT 13*; BUN  12; Creatinine, Ser 0.71; Hemoglobin 11.2*; Platelets 261; Potassium 4.3; Sodium 134*  No results found for requested labs within last 365 days.  Estimated Creatinine Clearance: 67.3 mL/min (by C-G formula based on Cr of 0.71).  Weights: Wt Readings from Last 3 Encounters:  08/23/15 150 lb (68.04 kg)  08/15/15 150 lb (68.04 kg)  08/06/15 150 lb (68.04 kg)    Other studies Reviewed: Additional studies/ records that were reviewed today include: prior cardiology notes, [facility] ED note, general surgery note.  Assessment & Plan:  1. PAF:  -Currently in rate controlled Afib with heart rate of 82 -Has had episodes of Afib with RVR with heart rates in the 1-teens at home with the increased pain/stress -Would aim for better rate control, as he is not currently on any rate controlling medications at the presentation of this office visit  -Discontinue lisinopril in an effort to give more blood pressure room -Start Lopressor 12.5 mg bid, titrate up as needed and as BP allows -He has missed at least 1 dose of Pradaxa within the past week, thus precluded him from antiarrhythmic medication at this time -He would need at least 3 weeks of uninterrupted full dose anticoagulation prior to starting any antiarrhythmic for chemical cardioversion -Given his acute illness cardioversion, either chemical or DCCV, is unlikely to hold at the current time, would aim for rate control  -Continue Pradaxa  -CHADSVASc at least 7 (HTN, age x 2, DM, stroke, vascular disease)   2. Intractable headache with associated myalgias, arthralgias, and generalized fatigue:  -Per PCP and general surgery  -If temporal arteritis is suspected would consider sed red and biopsy, per general surgery  -Unlikely to be of cardiac origin in the setting of negative blood cultures x 2 on 11/1, afebrile patient, and normotensive  -Will check echo to evaluate LV function, wall motion, and right-sided pressure    3. HTN:  -Well  controlled -Discontinue lisinopril for added rate control of Afib as above -Add Lopressor 12.5 mg bid as above  4. HLD:  -PCP discontinued Lipitor in case this was leading to his symptoms. This has not helped  5. PVD:  -Would restart Lipitor at PCP's discretion -Aspirin 81 mg     6. Vision loss: -Wife and son perform ADLs -Per PCP  Dispo: -Follow up in 3 months with Dr. Mariah Milling, MD  Current medicines are reviewed at length with the  patient today.  The patient did not have any concerns regarding medicines.   Eula Listen, PA-C [facility] [address] [place], [place] [address] [phone] [facility] 08/23/2015, 2:50 PM

## 2015-08-24 LAB — CBC
Hematocrit: 35.5 % — ABNORMAL LOW (ref 37.5–51.0)
Hemoglobin: 11.6 g/dL — ABNORMAL LOW (ref 12.6–17.7)
MCH: 27 pg (ref 26.6–33.0)
MCHC: 32.7 g/dL (ref 31.5–35.7)
MCV: 83 fL (ref 79–97)
PLATELETS: 964 10*3/uL — AB (ref 150–379)
RBC: 4.29 x10E6/uL (ref 4.14–5.80)
RDW: 16 % — AB (ref 12.3–15.4)
WBC: 10.1 10*3/uL (ref 3.4–10.8)

## 2015-08-24 LAB — COMPREHENSIVE METABOLIC PANEL
ALK PHOS: 71 IU/L (ref 39–117)
ALT: 40 IU/L (ref 0–44)
AST: 41 IU/L — AB (ref 0–40)
Albumin/Globulin Ratio: 1.1 (ref 1.1–2.5)
Albumin: 3.4 g/dL — ABNORMAL LOW (ref 3.5–4.7)
BUN/Creatinine Ratio: 27 — ABNORMAL HIGH (ref 10–22)
BUN: 20 mg/dL (ref 8–27)
Bilirubin Total: 0.4 mg/dL (ref 0.0–1.2)
CO2: 21 mmol/L (ref 18–29)
CREATININE: 0.75 mg/dL — AB (ref 0.76–1.27)
Calcium: 9.4 mg/dL (ref 8.6–10.2)
Chloride: 95 mmol/L — ABNORMAL LOW (ref 97–106)
GFR calc Af Amer: 98 mL/min/{1.73_m2} (ref >59)
GFR calc non Af Amer: 85 mL/min/{1.73_m2} (ref >59)
GLOBULIN, TOTAL: 3 g/dL (ref 1.5–4.5)
GLUCOSE: 108 mg/dL — AB (ref 65–99)
Potassium: 5.8 mmol/L — ABNORMAL HIGH (ref 3.5–5.2)
SODIUM: 135 mmol/L — AB (ref 136–144)
Total Protein: 6.4 g/dL (ref 6.0–8.5)

## 2015-08-24 LAB — SEDIMENTATION RATE: SED RATE: 92 mm/h — AB (ref 0–30)

## 2015-08-24 LAB — TSH: TSH: 1.59 u[IU]/mL (ref 0.450–4.500)

## 2015-08-24 LAB — CK: CK TOTAL: 45 U/L (ref 24–204)

## 2015-08-28 DIAGNOSIS — R32 Unspecified urinary incontinence: Secondary | ICD-10-CM | POA: Insufficient documentation

## 2015-08-31 DIAGNOSIS — M315 Giant cell arteritis with polymyalgia rheumatica: Secondary | ICD-10-CM | POA: Insufficient documentation

## 2015-09-12 ENCOUNTER — Telehealth: Payer: Self-pay

## 2015-09-12 NOTE — Telephone Encounter (Signed)
Pt wife would like to talk with a nurse regarding pt, states pt has been in the hospital, and will get out of rehab tomorrow afternoon. She needs to discuss if pt is safe to have echocardiogram on 12/13. Please call and advise

## 2015-09-12 NOTE — Telephone Encounter (Signed)
Spoke w/ pt's wife.  She is requesting that pt's ECHO be cancelled for now.  [facility] and she does not want to bring pt in for any more testing right now. Advised her of why the test was ordered, that [facility] has records to our system and could have ordered ECHO while inpt if it was deemed absolutely necessary. She reports that was started on prednisone and his HA is gone.  Asked her to call back when she would like to reschedule this.

## 2015-09-17 ENCOUNTER — Other Ambulatory Visit: Payer: Medicare Other

## 2015-10-11 ENCOUNTER — Encounter: Payer: Self-pay | Admitting: *Deleted

## 2015-10-11 ENCOUNTER — Encounter: Payer: Self-pay | Admitting: Obstetrics and Gynecology

## 2015-10-11 ENCOUNTER — Ambulatory Visit (INDEPENDENT_AMBULATORY_CARE_PROVIDER_SITE_OTHER): Payer: Medicare Other | Admitting: Obstetrics and Gynecology

## 2015-10-11 VITALS — BP 135/79 | HR 90 | Resp 16 | Wt 147.4 lb

## 2015-10-11 DIAGNOSIS — N400 Enlarged prostate without lower urinary tract symptoms: Secondary | ICD-10-CM

## 2015-10-11 DIAGNOSIS — N3941 Urge incontinence: Secondary | ICD-10-CM

## 2015-10-11 DIAGNOSIS — R300 Dysuria: Secondary | ICD-10-CM | POA: Diagnosis not present

## 2015-10-11 LAB — URINALYSIS, COMPLETE
BILIRUBIN UA: NEGATIVE
Glucose, UA: NEGATIVE
KETONES UA: NEGATIVE
LEUKOCYTES UA: NEGATIVE
Nitrite, UA: NEGATIVE
Protein, UA: NEGATIVE
RBC UA: NEGATIVE
SPEC GRAV UA: 1.02 (ref 1.005–1.030)
UUROB: 0.2 mg/dL (ref 0.2–1.0)
pH, UA: 6.5 (ref 5.0–7.5)

## 2015-10-11 LAB — BLADDER SCAN AMB NON-IMAGING

## 2015-10-11 LAB — MICROSCOPIC EXAMINATION
Bacteria, UA: NONE SEEN
Epithelial Cells (non renal): NONE SEEN /hpf (ref 0–10)
RBC MICROSCOPIC, UA: NONE SEEN /HPF (ref 0–?)
WBC, UA: NONE SEEN /hpf (ref 0–?)

## 2015-10-11 NOTE — Progress Notes (Signed)
10/11/2015 3:54 PM   Chad Bailey 04/20/1930 [id number]  Referring provider: Sherlene Shamseresa L Tullo, MD [address] [place], [place] [id number]  Chief Complaint  Patient presents with  . Dysuria  . Establish Care    HPI: Patient is a 82 year old male with a history of hypertension, A. Fib, CVA, type 2 diabetes with insulin dependence, hyperlipidemia, vision loss and [facility] he was diagnosed with temporal arteritis/PMR.  He presents today with complaints of increased difficulty controlling his bladder since he sustained a fall in November and further worsening of symptoms when he was admitted later that month. Since his discharge he reports that he has overall been feeling better his urine. Symptoms have improved but he has not yet had his baseline.  Baseline urinary symptoms include occasional urge incontinence requiring him to wear a brief though he only typically minimally saturates 2 per day. Since his symptoms have worsened he was saturating up to 7 per day but states that this has much improved. He also occasionally experiences some intermittent mild dysuria.   He has been taking finasteride and Flomax for approximately the last 16 years for treatment BPH. He denies any adverse effects of these medications.  He is on anticoagulation therapy (Pradaxa).  PMH: Past Medical History  Diagnosis Date  . Non-insulin dependent diabetes mellitus   . PAF (paroxysmal atrial fibrillation) (HCC)     a. on pradaxa; b. CHADS2VASc at least 7 (HTN, age x 2, DM, stroke, vascular disease)  . Hyperlipidemia   . Fracture of left ankle   . Hypertension   . History of tobacco abuse   . gouty arthritis   . Loss of vision 01/2013    left eye  . Thyroid nodule 2014  . Retinal artery thrombosis, left 2014    Surgical History: Past Surgical History  Procedure Laterality Date  . Left ankle   1996    s/p surgery with screws  . Lasik      bilateral eye surgery  . Eye  surgery      right    Home Medications:    Medication List       This list is accurate as of: 10/11/15  3:54 PM.  Always use your most recent med list.               acetaminophen 500 MG tablet  Commonly known as:  TYLENOL  Take by mouth.     acyclovir 400 MG tablet  Commonly known as:  ZOVIRAX  Take 800 mg by mouth 2 (two) times daily.     aspirin 81 MG tablet  Take 81 mg by mouth daily. Reported on 10/11/2015     Calcium Carbonate-Vitamin D 600-400 MG-UNIT tablet  Take by mouth.     cyanocobalamin 1000 MCG/ML injection  Commonly known as:  (VITAMIN B-12)  Inject into the muscle.     DUREZOL 0.05 % Emul  Generic drug:  Difluprednate  Apply to eye 2 (two) times daily.     finasteride 5 MG tablet  Commonly known as:  PROSCAR  TAKE ONE TABLET BY MOUTH ONCE DAILY     glucose blood test strip  Check blood sugar twice daily as instructed. Pt has Contour Next EZ. Dx E11.319     HUMALOG 100 UNIT/ML injection  Generic drug:  insulin lispro     insulin aspart protamine - aspart (70-30) 100 UNIT/ML FlexPen  Commonly known as:  NOVOLOG MIX 70/30 FLEXPEN  INJECT 14  UNITS INTO THE SKIN TWICE DAILY WITH A MEAL.     LANTUS 100 UNIT/ML injection  Generic drug:  insulin glargine  Inject into the skin.     metFORMIN 1000 MG tablet  Commonly known as:  GLUCOPHAGE  TAKE ONE TABLET BY MOUTH TWICE DAILY WITH MEALS     metoprolol tartrate 25 MG tablet  Commonly known as:  LOPRESSOR  Take 0.5 tablets (12.5 mg total) by mouth 2 (two) times daily.     MICROLET LANCETS Misc  USE ONE LANCET TWICE DAILY  Dx code:  E11.9     NOVOFINE 30G X 8 MM Misc  Generic drug:  Insulin Pen Needle  USE ONE NEEDLE EACH TIME INSULIN IS INJECTED INTO THE SKIN     pantoprazole 40 MG tablet  Commonly known as:  PROTONIX  Take by mouth.     PRADAXA 150 MG Caps capsule  Generic drug:  dabigatran  TAKE ONE CAPSULE BY MOUTH EVERY 12 HOURS     predniSONE 10 MG tablet  Commonly known as:   DELTASONE  Take by mouth.     senna-docusate 8.6-50 MG tablet  Commonly known as:  Senokot-S  Take by mouth.     SIMBRINZA 1-0.2 % Susp  Generic drug:  Brinzolamide-Brimonidine  Apply to eye 2 (two) times daily.     tamsulosin 0.4 MG Caps capsule  Commonly known as:  FLOMAX  TAKE ONE CAPSULE BY MOUTH ONCE DAILY     timolol 0.5 % ophthalmic solution  Commonly known as:  BETIMOL  Apply 1 drop to eye 2 (two) times daily.     travoprost (benzalkonium) 0.004 % ophthalmic solution  Commonly known as:  TRAVATAN  Apply 1 drop to eye at bedtime.        Allergies: No Known Allergies  Family History: Family History  Problem Relation Age of Onset  . Heart disease Mother   . Tuberculosis Mother     Social History:  reports that he quit smoking about 13 years ago. His smoking use included Cigarettes and Pipe. He has a 336 pack-year smoking history. He has quit using smokeless tobacco. His smokeless tobacco use included Chew. He reports that he does not drink alcohol or use illicit drugs.  ROS: UROLOGY Frequent Urination?: Yes Hard to postpone urination?: Yes Burning/pain with urination?: Yes Get up at night to urinate?: Yes Leakage of urine?: Yes Urine stream starts and stops?: Yes Trouble starting stream?: No Do you have to strain to urinate?: Yes Blood in urine?: No Urinary tract infection?: No Sexually transmitted disease?: No Injury to kidneys or bladder?: No Painful intercourse?: No Weak stream?: Yes Erection problems?: No Penile pain?: No  Gastrointestinal Nausea?: No Vomiting?: No Indigestion/heartburn?: No Diarrhea?: No Constipation?: Yes  Constitutional Fever: Yes Night sweats?: No Weight loss?: Yes Fatigue?: Yes  Skin Skin rash/lesions?: Yes Itching?: Yes  Eyes Blurred vision?: No (Blind) Double vision?: No (Blind)  Ears/Nose/Throat Sore throat?: No Sinus problems?: Yes  Hematologic/Lymphatic Swollen glands?: No Easy bruising?:  Yes  Cardiovascular Leg swelling?: No Chest pain?: No  Respiratory Cough?: No Shortness of breath?: No  Endocrine Excessive thirst?: No  Musculoskeletal Back pain?: No Joint pain?: Yes  Neurological Headaches?: Yes Dizziness?: Yes  Psychologic Depression?: No Anxiety?: No  Physical Exam: BP 135/79 mmHg  Pulse 90  Resp 16  Wt 147 lb 6.4 oz (66.86 kg)  Constitutional:  Alert and oriented, No acute distress. HEENT: [place] AT, moist mucus membranes.  Trachea midline, no masses. Cardiovascular: No clubbing,  cyanosis, or edema. Respiratory: Normal respiratory effort, no increased work of breathing. GI: Abdomen is soft, nontender, nondistended, no abdominal masses GU: No CVA tenderness. Uncircumcised phallus with easily retractable foreskin, testicles descended bilaterally without palpable masses DRE: +3 and smooth, no palpable nodules Skin: No rashes, bruises or suspicious lesions. Lymph: No cervical or inguinal adenopathy. Neurologic: Grossly intact, no focal deficits, moving all 4 extremities. Psychiatric: Normal mood and affect.  Laboratory Data:   Urinalysis Results for orders placed or performed in visit on 10/11/15  Microscopic Examination  Result Value Ref Range   WBC, UA None seen 0 -  5 /hpf   RBC, UA None seen 0 -  2 /hpf   Epithelial Cells (non renal) None seen 0 - 10 /hpf   Bacteria, UA None seen None seen/Few  Urinalysis, Complete  Result Value Ref Range   Specific Gravity, UA 1.020 1.005 - 1.030   pH, UA 6.5 5.0 - 7.5   Color, UA Yellow Yellow   Appearance Ur Clear Clear   Leukocytes, UA Negative Negative   Protein, UA Negative Negative/Trace   Glucose, UA Negative Negative   Ketones, UA Negative Negative   RBC, UA Negative Negative   Bilirubin, UA Negative Negative   Urobilinogen, Ur 0.2 0.2 - 1.0 mg/dL   Nitrite, UA Negative Negative   Microscopic Examination See below:   BLADDER SCAN AMB NON-IMAGING  Result Value Ref Range   Scan Result 118  mL      Pertinent Imaging:   Assessment & Plan:    1. Dysuria-  Intermittent. No complaints of dysuria today. UA unremarkable. - Urinalysis, Complete - BLADDER SCAN AMB NON-IMAGING  2. Urge incontinence-  Patient experienced some incontinence at baseline. He reports his symptoms are  improving since his recent admission. I recommended that we follow up and reassess patient's symptoms in 3 weeks to allow for adequate recovery time from his recent acute illness. If his symptoms are still bothersome at that time we may attempt a trial of Myrbetriq for treatment of his urinary frequency and incontinence.  3. BPH-  Continue finasteride and Flomax as previously prescribed. Patient is emptying his bladder quite well. He is not a good surgical candidate. We will follow up in 3 weeks and reassess symptoms.   Return in about 3 weeks (around 11/01/2015) for recheck urinary symptoms.  These notes generated with voice recognition software. I apologize for typographical errors.  Earlie Lou, FNP  [facility] [address] [place], [place] [address] [phone]

## 2015-11-01 ENCOUNTER — Encounter: Payer: Self-pay | Admitting: Obstetrics and Gynecology

## 2015-11-01 ENCOUNTER — Ambulatory Visit (INDEPENDENT_AMBULATORY_CARE_PROVIDER_SITE_OTHER): Payer: Medicare Other | Admitting: Obstetrics and Gynecology

## 2015-11-01 VITALS — BP 160/84 | HR 84 | Resp 128 | Ht 72.0 in | Wt 149.8 lb

## 2015-11-01 DIAGNOSIS — N400 Enlarged prostate without lower urinary tract symptoms: Secondary | ICD-10-CM | POA: Diagnosis not present

## 2015-11-01 LAB — URINALYSIS, COMPLETE
BILIRUBIN UA: NEGATIVE
Glucose, UA: NEGATIVE
Ketones, UA: NEGATIVE
Leukocytes, UA: NEGATIVE
Nitrite, UA: NEGATIVE
PH UA: 5 (ref 5.0–7.5)
Protein, UA: NEGATIVE
RBC UA: NEGATIVE
Specific Gravity, UA: 1.02 (ref 1.005–1.030)
Urobilinogen, Ur: 0.2 mg/dL (ref 0.2–1.0)

## 2015-11-01 LAB — MICROSCOPIC EXAMINATION
EPITHELIAL CELLS (NON RENAL): NONE SEEN /HPF (ref 0–10)
RBC, UA: NONE SEEN /hpf (ref 0–?)

## 2015-11-01 NOTE — Progress Notes (Addendum)
3:35 PM   Chad Bailey 10-Jul-1930 [id number]  Referring provider: Sherlene Shams, MD [address] [place], [place] [address]  Chief Complaint  Patient presents with  . Benign Prostatic Hypertrophy    3 week follow up  . Urinary Incontinence    Urge    HPI: Patient is a 82 year old male with a history of hypertension, A. Fib, CVA, type 2 diabetes with insulin dependence, hyperlipidemia, vision loss and recent admission at [facility] where he was diagnosed with temporal arteritis/PMR.  He presents today with complaints of increased difficulty controlling his bladder since he sustained a fall in November and further worsening of symptoms when he was admitted later that month. Since his discharge he reports that he has overall been feeling better his urine. Symptoms have improved but he has not yet had his baseline.  Baseline urinary symptoms include occasional urge incontinence requiring him to wear a brief though he only typically minimally saturates 2 per day. Since his symptoms have worsened he was saturating up to 7 per day but states that this has much improved. He also occasionally experiences some intermittent mild dysuria.   He has been taking finasteride and Flomax for approximately the last 16 years for treatment BPH. He denies any adverse effects of these medications.  He is on anticoagulation therapy (Pradaxa).  Interval History: Patient and family report much improvement in patient's urinary frequency. He is now only using 1 brief per day and reports it is not completely saturated. Patient states he has not particularly bothered by his symptoms and he is close to being back to his previous baseline. He is not interested in any further treatment or medications.   PMH: Past Medical History  Diagnosis Date  . Non-insulin dependent diabetes mellitus   . PAF (paroxysmal atrial fibrillation) (HCC)     a. on pradaxa; b. CHADS2VASc at least 7 (HTN, age x 2, DM, stroke,  vascular disease)  . Hyperlipidemia   . Fracture of left ankle   . Hypertension   . History of tobacco abuse   . gouty arthritis   . Loss of vision 01/2013    left eye  . Thyroid nodule 2014  . Retinal artery thrombosis, left 2014  . Dysuria     Surgical History: Past Surgical History  Procedure Laterality Date  . Left ankle   1996    s/p surgery with screws  . Lasik      bilateral eye surgery  . Eye surgery      right    Home Medications:    Medication List       This list is accurate as of: 11/01/15  3:35 PM.  Always use your most recent med list.               acetaminophen 500 MG tablet  Commonly known as:  TYLENOL  Take by mouth. Reported on 11/01/2015     acyclovir 400 MG tablet  Commonly known as:  ZOVIRAX  Take 800 mg by mouth 2 (two) times daily.     aspirin 81 MG tablet  Take 81 mg by mouth daily. Reported on 11/01/2015     Calcium Carbonate-Vitamin D 600-400 MG-UNIT tablet  Take by mouth.     cyanocobalamin 1000 MCG/ML injection  Commonly known as:  (VITAMIN B-12)  Inject into the muscle.     DUREZOL 0.05 % Emul  Generic drug:  Difluprednate  Apply to eye 2 (two) times daily.  finasteride 5 MG tablet  Commonly known as:  PROSCAR  TAKE ONE TABLET BY MOUTH ONCE DAILY     glucose blood test strip  Check blood sugar twice daily as instructed. Pt has Contour Next EZ. Dx E11.319     HUMALOG 100 UNIT/ML injection  Generic drug:  insulin lispro     insulin aspart protamine - aspart (70-30) 100 UNIT/ML FlexPen  Commonly known as:  NOVOLOG MIX 70/30 FLEXPEN  INJECT 14 UNITS INTO THE SKIN TWICE DAILY WITH A MEAL.     LANTUS 100 UNIT/ML injection  Generic drug:  insulin glargine  Inject into the skin.     metFORMIN 1000 MG tablet  Commonly known as:  GLUCOPHAGE  TAKE ONE TABLET BY MOUTH TWICE DAILY WITH MEALS     metoprolol tartrate 25 MG tablet  Commonly known as:  LOPRESSOR  Take 0.5 tablets (12.5 mg total) by mouth 2 (two) times daily.      MICROLET LANCETS Misc  USE ONE LANCET TWICE DAILY  Dx code:  E11.9     NOVOFINE 30G X 8 MM Misc  Generic drug:  Insulin Pen Needle  USE ONE NEEDLE EACH TIME INSULIN IS INJECTED INTO THE SKIN     pantoprazole 40 MG tablet  Commonly known as:  PROTONIX  Take by mouth.     PRADAXA 150 MG Caps capsule  Generic drug:  dabigatran  TAKE ONE CAPSULE BY MOUTH EVERY 12 HOURS     predniSONE 10 MG tablet  Commonly known as:  DELTASONE  Take by mouth.     senna-docusate 8.6-50 MG tablet  Commonly known as:  Senokot-S  Take by mouth.     SIMBRINZA 1-0.2 % Susp  Generic drug:  Brinzolamide-Brimonidine  Apply to eye 2 (two) times daily.     tamsulosin 0.4 MG Caps capsule  Commonly known as:  FLOMAX  TAKE ONE CAPSULE BY MOUTH ONCE DAILY     timolol 0.5 % ophthalmic solution  Commonly known as:  BETIMOL  Apply 1 drop to eye 2 (two) times daily.     travoprost (benzalkonium) 0.004 % ophthalmic solution  Commonly known as:  TRAVATAN  Apply 1 drop to eye at bedtime.        Allergies: No Known Allergies  Family History: Family History  Problem Relation Age of Onset  . Heart disease Mother   . Tuberculosis Mother   . Kidney disease Neg Hx   . Prostate cancer Neg Hx   . Lung cancer Father   . Cancer Maternal Grandmother     bowel    Social History:  reports that he quit smoking about 13 years ago. His smoking use included Cigarettes and Pipe. He has a 336 pack-year smoking history. He has quit using smokeless tobacco. His smokeless tobacco use included Chew. He reports that he does not drink alcohol or use illicit drugs.  ROS: UROLOGY Frequent Urination?: Yes Hard to postpone urination?: Yes Burning/pain with urination?: Yes Get up at night to urinate?: Yes Leakage of urine?: Yes Urine stream starts and stops?: No Trouble starting stream?: No Do you have to strain to urinate?: No Blood in urine?: No Urinary tract infection?: No Sexually transmitted disease?:  No Injury to kidneys or bladder?: No Painful intercourse?: No Weak stream?: Yes Erection problems?: No Penile pain?: No  Gastrointestinal Nausea?: No Vomiting?: No Indigestion/heartburn?: Yes Diarrhea?: No Constipation?: Yes  Constitutional Fever: No Night sweats?: No Weight loss?: No Fatigue?: Yes  Skin Skin rash/lesions?: Yes Itching?: Yes  Eyes Blurred vision?: No Double vision?: No  Ears/Nose/Throat Sore throat?: No Sinus problems?: No  Hematologic/Lymphatic Swollen glands?: No Easy bruising?: Yes  Cardiovascular Leg swelling?: No Chest pain?: No  Respiratory Cough?: No Shortness of breath?: Yes  Endocrine Excessive thirst?: No  Musculoskeletal Back pain?: No Joint pain?: Yes  Neurological Headaches?: No Dizziness?: No  Psychologic Depression?: No Anxiety?: No  Physical Exam: BP 160/84 mmHg  Pulse 84  Resp 128  Ht 6' (1.829 m)  Wt 149 lb 12.8 oz (67.949 kg)  BMI 20.31 kg/m2  Constitutional:  Alert and oriented, No acute distress. HEENT: [place] AT, moist mucus membranes.  Trachea midline, no masses. Cardiovascular: No clubbing, cyanosis, or edema. Respiratory: Normal respiratory effort, no increased work of breathing.  Skin: No rashes, bruises or suspicious lesions. Lymph: No cervical or inguinal adenopathy. Neurologic: Grossly intact, no focal deficits, moving all 4 extremities. Psychiatric: Normal mood and affect.  Laboratory Data:   Urinalysis   Pertinent Imaging:   Assessment & Plan:    1. Dysuria-  Intermittent. No complaints of dysuria today. UA unremarkable. - Urinalysis, Complete - BLADDER SCAN AMB NON-IMAGING  2. Urge incontinence- Previous PVR .  Patient experienced some incontinence at baseline. He reports his symptoms have continued to  improve since his recent admission. He states that he is not bothered by his current urinary symptoms and is not interested in any medications at this time. He would like to  follow up on an as-needed basis.  3. BPH-  Continue finasteride and Flomax as previously prescribed. Patient is emptying his bladder quite well. He is not a good surgical candidate.   Return if symptoms worsen or fail to improve.  These notes generated with voice recognition software. I apologize for typographical errors.  Earlie Lou, FNP  [facility] [address] [place], [place] [address] [phone]

## 2015-11-06 ENCOUNTER — Other Ambulatory Visit: Payer: Self-pay | Admitting: Internal Medicine

## 2015-11-06 DIAGNOSIS — I714 Abdominal aortic aneurysm, without rupture, unspecified: Secondary | ICD-10-CM

## 2015-11-06 DIAGNOSIS — M315 Giant cell arteritis with polymyalgia rheumatica: Secondary | ICD-10-CM

## 2015-11-11 ENCOUNTER — Ambulatory Visit
Admission: RE | Admit: 2015-11-11 | Discharge: 2015-11-11 | Disposition: A | Discharge status: Home / Self Care | Payer: Medicare Other | Source: Ambulatory Visit | Attending: Internal Medicine | Admitting: Internal Medicine

## 2015-11-11 DIAGNOSIS — M315 Giant cell arteritis with polymyalgia rheumatica: Secondary | ICD-10-CM | POA: Insufficient documentation

## 2015-11-11 DIAGNOSIS — I714 Abdominal aortic aneurysm, without rupture, unspecified: Secondary | ICD-10-CM

## 2015-11-11 DIAGNOSIS — Z136 Encounter for screening for cardiovascular disorders: Secondary | ICD-10-CM | POA: Insufficient documentation

## 2015-11-15 ENCOUNTER — Telehealth: Payer: Self-pay | Admitting: *Deleted

## 2015-11-15 NOTE — Telephone Encounter (Signed)
Called patient to confirm appointment that was scheduled for 11/18/15 with Dr Mariah Milling But once I got patient on phone (wife was on the phone as well)  They had some concerns about insurance and if the appointment was needed  Looks like pt was to have echo but at that time pt was admitted in [facility] having some other issues.  Pt and wife both stated they don't think he needs this appointment for he states he is doing well I assured them that this was what we recommenced but if they did not want to proceed with it then it was okay I would let the provider and nurse know and if we had any concerns we would call them back. Please call if we need to discuss anything regarding this.

## 2015-11-18 ENCOUNTER — Ambulatory Visit: Payer: Medicare Other | Admitting: Cardiovascular Disease

## 2016-08-06 ENCOUNTER — Telehealth: Payer: Self-pay | Admitting: Internal Medicine

## 2016-08-06 NOTE — Telephone Encounter (Signed)
I called pt and left a vm to call office to sch AWV. Thank you! °

## 2016-09-30 ENCOUNTER — Other Ambulatory Visit: Payer: Self-pay | Admitting: Physician Assistant

## 2016-10-06 ENCOUNTER — Emergency Department: Payer: Medicare Other

## 2016-10-06 ENCOUNTER — Observation Stay
Admission: EM | Admit: 2016-10-06 | Discharge: 2016-10-07 | Disposition: A | Discharge status: Home Health | Payer: Medicare Other | Attending: Internal Medicine | Admitting: Internal Medicine

## 2016-10-06 DIAGNOSIS — T380X5A Adverse effect of glucocorticoids and synthetic analogues, initial encounter: Secondary | ICD-10-CM | POA: Diagnosis not present

## 2016-10-06 DIAGNOSIS — E11319 Type 2 diabetes mellitus with unspecified diabetic retinopathy without macular edema: Secondary | ICD-10-CM | POA: Diagnosis not present

## 2016-10-06 DIAGNOSIS — Z79899 Other long term (current) drug therapy: Secondary | ICD-10-CM | POA: Insufficient documentation

## 2016-10-06 DIAGNOSIS — H409 Unspecified glaucoma: Secondary | ICD-10-CM | POA: Insufficient documentation

## 2016-10-06 DIAGNOSIS — R4182 Altered mental status, unspecified: Secondary | ICD-10-CM | POA: Diagnosis not present

## 2016-10-06 DIAGNOSIS — M315 Giant cell arteritis with polymyalgia rheumatica: Secondary | ICD-10-CM | POA: Insufficient documentation

## 2016-10-06 DIAGNOSIS — Z7952 Long term (current) use of systemic steroids: Secondary | ICD-10-CM | POA: Diagnosis not present

## 2016-10-06 DIAGNOSIS — R7989 Other specified abnormal findings of blood chemistry: Secondary | ICD-10-CM

## 2016-10-06 DIAGNOSIS — H547 Unspecified visual loss: Secondary | ICD-10-CM | POA: Insufficient documentation

## 2016-10-06 DIAGNOSIS — E785 Hyperlipidemia, unspecified: Secondary | ICD-10-CM | POA: Diagnosis not present

## 2016-10-06 DIAGNOSIS — Z87891 Personal history of nicotine dependence: Secondary | ICD-10-CM | POA: Diagnosis not present

## 2016-10-06 DIAGNOSIS — D72829 Elevated white blood cell count, unspecified: Secondary | ICD-10-CM

## 2016-10-06 DIAGNOSIS — R531 Weakness: Secondary | ICD-10-CM | POA: Diagnosis not present

## 2016-10-06 DIAGNOSIS — E11649 Type 2 diabetes mellitus with hypoglycemia without coma: Secondary | ICD-10-CM | POA: Insufficient documentation

## 2016-10-06 DIAGNOSIS — I482 Chronic atrial fibrillation, unspecified: Secondary | ICD-10-CM

## 2016-10-06 DIAGNOSIS — E162 Hypoglycemia, unspecified: Secondary | ICD-10-CM | POA: Diagnosis present

## 2016-10-06 DIAGNOSIS — N400 Enlarged prostate without lower urinary tract symptoms: Secondary | ICD-10-CM | POA: Insufficient documentation

## 2016-10-06 DIAGNOSIS — E2740 Unspecified adrenocortical insufficiency: Secondary | ICD-10-CM | POA: Diagnosis not present

## 2016-10-06 DIAGNOSIS — E1151 Type 2 diabetes mellitus with diabetic peripheral angiopathy without gangrene: Secondary | ICD-10-CM | POA: Insufficient documentation

## 2016-10-06 DIAGNOSIS — R112 Nausea with vomiting, unspecified: Secondary | ICD-10-CM | POA: Insufficient documentation

## 2016-10-06 DIAGNOSIS — Z7901 Long term (current) use of anticoagulants: Secondary | ICD-10-CM | POA: Insufficient documentation

## 2016-10-06 DIAGNOSIS — Z794 Long term (current) use of insulin: Secondary | ICD-10-CM | POA: Insufficient documentation

## 2016-10-06 DIAGNOSIS — I959 Hypotension, unspecified: Secondary | ICD-10-CM | POA: Diagnosis not present

## 2016-10-06 DIAGNOSIS — I10 Essential (primary) hypertension: Secondary | ICD-10-CM | POA: Diagnosis not present

## 2016-10-06 DIAGNOSIS — R778 Other specified abnormalities of plasma proteins: Secondary | ICD-10-CM

## 2016-10-06 DIAGNOSIS — Z66 Do not resuscitate: Secondary | ICD-10-CM | POA: Insufficient documentation

## 2016-10-06 DIAGNOSIS — I340 Nonrheumatic mitral (valve) insufficiency: Secondary | ICD-10-CM | POA: Insufficient documentation

## 2016-10-06 DIAGNOSIS — Z7982 Long term (current) use of aspirin: Secondary | ICD-10-CM | POA: Diagnosis not present

## 2016-10-06 DIAGNOSIS — R251 Tremor, unspecified: Secondary | ICD-10-CM | POA: Diagnosis not present

## 2016-10-06 HISTORY — DX: Type 2 diabetes mellitus without complications: E11.9

## 2016-10-06 HISTORY — DX: Unspecified visual loss: H54.7

## 2016-10-06 HISTORY — DX: Other giant cell arteritis: M31.6

## 2016-10-06 LAB — COMPREHENSIVE METABOLIC PANEL
ALK PHOS: 47 U/L (ref 38–126)
ALT: 16 U/L — ABNORMAL LOW (ref 17–63)
AST: 29 U/L (ref 15–41)
Albumin: 3.9 g/dL (ref 3.5–5.0)
Anion gap: 8 (ref 5–15)
BUN: 20 mg/dL (ref 6–20)
CALCIUM: 9.4 mg/dL (ref 8.9–10.3)
CO2: 26 mmol/L (ref 22–32)
CREATININE: 0.95 mg/dL (ref 0.61–1.24)
Chloride: 102 mmol/L (ref 101–111)
GFR calc Af Amer: >60 mL/min (ref >60)
Glucose, Bld: 170 mg/dL — ABNORMAL HIGH (ref 65–99)
Potassium: 3.8 mmol/L (ref 3.5–5.1)
Sodium: 136 mmol/L (ref 135–145)
Total Bilirubin: 1 mg/dL (ref 0.3–1.2)
Total Protein: 6.8 g/dL (ref 6.5–8.1)

## 2016-10-06 LAB — CBC WITH DIFFERENTIAL/PLATELET
BASOS PCT: 0 %
Basophils Absolute: 0.1 10*3/uL (ref 0–0.1)
EOS ABS: 0.2 10*3/uL (ref 0–0.7)
Eosinophils Relative: 1 %
HCT: 45.2 % (ref 40.0–52.0)
HEMOGLOBIN: 15 g/dL (ref 13.0–18.0)
Lymphocytes Relative: 8 %
Lymphs Abs: 1 10*3/uL (ref 1.0–3.6)
MCH: 29.1 pg (ref 26.0–34.0)
MCHC: 33.2 g/dL (ref 32.0–36.0)
MCV: 87.7 fL (ref 80.0–100.0)
MONOS PCT: 2 %
Monocytes Absolute: 0.2 10*3/uL (ref 0.2–1.0)
NEUTROS PCT: 89 %
Neutro Abs: 12.3 10*3/uL — ABNORMAL HIGH (ref 1.4–6.5)
Platelets: 235 10*3/uL (ref 150–440)
RBC: 5.15 MIL/uL (ref 4.40–5.90)
RDW: 16.3 % — ABNORMAL HIGH (ref 11.5–14.5)
WBC: 13.8 10*3/uL — AB (ref 3.8–10.6)

## 2016-10-06 LAB — TROPONIN I
TROPONIN I: 0.03 ng/mL — AB (ref <0.03)
Troponin I: 0.04 ng/mL (ref <0.03)
Troponin I: 0.04 ng/mL (ref <0.03)
Troponin I: 0.05 ng/mL (ref <0.03)

## 2016-10-06 LAB — URINALYSIS, COMPLETE (UACMP) WITH MICROSCOPIC
Bilirubin Urine: NEGATIVE
GLUCOSE, UA: 50 mg/dL — AB
Hgb urine dipstick: NEGATIVE
KETONES UR: NEGATIVE mg/dL
LEUKOCYTES UA: NEGATIVE
Nitrite: NEGATIVE
PROTEIN: NEGATIVE mg/dL
SQUAMOUS EPITHELIAL / LPF: NONE SEEN
Specific Gravity, Urine: 1.009 (ref 1.005–1.030)
pH: 5 (ref 5.0–8.0)

## 2016-10-06 LAB — GLUCOSE, CAPILLARY
GLUCOSE-CAPILLARY: 136 mg/dL — AB (ref 65–99)
Glucose-Capillary: 152 mg/dL — ABNORMAL HIGH (ref 65–99)
Glucose-Capillary: 156 mg/dL — ABNORMAL HIGH (ref 65–99)
Glucose-Capillary: 154 mg/dL — ABNORMAL HIGH (ref 65–99)
Glucose-Capillary: 155 mg/dL — ABNORMAL HIGH (ref 65–99)
Glucose-Capillary: 149 mg/dL — ABNORMAL HIGH (ref 65–99)
Glucose-Capillary: 222 mg/dL — ABNORMAL HIGH (ref 65–99)
Glucose-Capillary: 213 mg/dL — ABNORMAL HIGH (ref 65–99)

## 2016-10-06 LAB — MAGNESIUM: Magnesium: 1.2 mg/dL — ABNORMAL LOW (ref 1.7–2.4)

## 2016-10-06 LAB — PROTIME-INR
INR: 1.61
Prothrombin Time: 19.3 seconds — ABNORMAL HIGH (ref 11.4–15.2)

## 2016-10-06 LAB — LACTIC ACID, PLASMA
Lactic Acid, Venous: 2.8 mmol/L (ref 0.5–1.9)
Lactic Acid, Venous: 1.8 mmol/L (ref 0.5–1.9)

## 2016-10-06 LAB — TSH: TSH: 0.705 u[IU]/mL (ref 0.350–4.500)

## 2016-10-06 LAB — LIPASE, BLOOD: LIPASE: 17 U/L (ref 11–51)

## 2016-10-06 MED ORDER — TIMOLOL MALEATE 0.5 % OP SOLN
1.0000 [drp] | Freq: Two times a day (BID) | OPHTHALMIC | Status: DC
  Administered 2016-10-06 – 2016-10-07 (×3): 1 [drp] via OPHTHALMIC
  Filled 2016-10-06: qty 5

## 2016-10-06 MED ORDER — PREDNISONE 1 MG PO TABS
3.0000 mg | ORAL_TABLET | Freq: Every day | ORAL | Status: DC
  Administered 2016-10-07: 3 mg via ORAL
  Filled 2016-10-06: qty 3

## 2016-10-06 MED ORDER — METFORMIN HCL 500 MG PO TABS
1000.0000 mg | ORAL_TABLET | Freq: Two times a day (BID) | ORAL | Status: DC
  Administered 2016-10-06 – 2016-10-07 (×3): 1000 mg via ORAL
  Filled 2016-10-06 (×3): qty 2

## 2016-10-06 MED ORDER — DOCUSATE SODIUM 100 MG PO CAPS
100.0000 mg | ORAL_CAPSULE | Freq: Two times a day (BID) | ORAL | Status: DC
  Administered 2016-10-06 – 2016-10-07 (×3): 100 mg via ORAL
  Filled 2016-10-06 (×3): qty 1

## 2016-10-06 MED ORDER — ONDANSETRON HCL 4 MG PO TABS: 4.0000 mg | ORAL_TABLET | Freq: Four times a day (QID) | ORAL | Status: DC | PRN

## 2016-10-06 MED ORDER — TRAVOPROST (BAK FREE) 0.004 % OP SOLN
1.0000 [drp] | Freq: Every day | OPHTHALMIC | Status: DC
  Administered 2016-10-06: 1 [drp] via OPHTHALMIC
  Filled 2016-10-06: qty 2.5

## 2016-10-06 MED ORDER — ASPIRIN 81 MG PO CHEW
81.0000 mg | CHEWABLE_TABLET | Freq: Every day | ORAL | Status: DC
  Administered 2016-10-06 – 2016-10-07 (×2): 81 mg via ORAL
  Filled 2016-10-06 (×2): qty 1

## 2016-10-06 MED ORDER — METOPROLOL TARTRATE 25 MG PO TABS
12.5000 mg | ORAL_TABLET | Freq: Two times a day (BID) | ORAL | Status: DC
  Administered 2016-10-06 – 2016-10-07 (×2): 12.5 mg via ORAL
  Filled 2016-10-06 (×3): qty 1

## 2016-10-06 MED ORDER — SODIUM CHLORIDE 0.9 % IV BOLUS (SEPSIS)
1000.0000 mL | Freq: Once | INTRAVENOUS | Status: AC
  Administered 2016-10-06: 1000 mL via INTRAVENOUS

## 2016-10-06 MED ORDER — FINASTERIDE 5 MG PO TABS
5.0000 mg | ORAL_TABLET | Freq: Every day | ORAL | Status: DC
  Administered 2016-10-06 – 2016-10-07 (×2): 5 mg via ORAL
  Filled 2016-10-06 (×2): qty 1

## 2016-10-06 MED ORDER — ONDANSETRON HCL 4 MG/2ML IJ SOLN: 4.0000 mg | Freq: Four times a day (QID) | INTRAMUSCULAR | Status: DC | PRN

## 2016-10-06 MED ORDER — ACYCLOVIR 200 MG PO CAPS
800.0000 mg | ORAL_CAPSULE | Freq: Two times a day (BID) | ORAL | Status: DC
  Administered 2016-10-06 – 2016-10-07 (×3): 800 mg via ORAL
  Filled 2016-10-06 (×4): qty 4

## 2016-10-06 MED ORDER — TAMSULOSIN HCL 0.4 MG PO CAPS
0.4000 mg | ORAL_CAPSULE | Freq: Every day | ORAL | Status: DC
  Administered 2016-10-06 – 2016-10-07 (×2): 0.4 mg via ORAL
  Filled 2016-10-06 (×2): qty 1

## 2016-10-06 MED ORDER — DIFLUPREDNATE 0.05 % OP EMUL: 1.0000 [drp] | Freq: Two times a day (BID) | OPHTHALMIC | Status: DC

## 2016-10-06 MED ORDER — ACETAMINOPHEN 325 MG PO TABS: 650.0000 mg | ORAL_TABLET | Freq: Four times a day (QID) | ORAL | Status: DC | PRN

## 2016-10-06 MED ORDER — PREDNISONE 1 MG PO TABS
4.0000 mg | ORAL_TABLET | Freq: Every day | ORAL | Status: DC
  Administered 2016-10-06: 4 mg via ORAL
  Filled 2016-10-06: qty 4

## 2016-10-06 MED ORDER — MAGNESIUM SULFATE 2 GM/50ML IV SOLN
2.0000 g | Freq: Once | INTRAVENOUS | Status: AC
  Administered 2016-10-06: 2 g via INTRAVENOUS
  Filled 2016-10-06: qty 50

## 2016-10-06 MED ORDER — DABIGATRAN ETEXILATE MESYLATE 150 MG PO CAPS
150.0000 mg | ORAL_CAPSULE | Freq: Two times a day (BID) | ORAL | Status: DC
  Administered 2016-10-06 – 2016-10-07 (×3): 150 mg via ORAL
  Filled 2016-10-06 (×4): qty 1

## 2016-10-06 MED ORDER — PIPERACILLIN-TAZOBACTAM 3.375 G IVPB 30 MIN
3.3750 g | Freq: Once | INTRAVENOUS | Status: AC
  Administered 2016-10-06: 3.375 g via INTRAVENOUS
  Filled 2016-10-06: qty 50

## 2016-10-06 MED ORDER — SODIUM CHLORIDE 0.9% FLUSH
3.0000 mL | Freq: Two times a day (BID) | INTRAVENOUS | Status: DC
  Administered 2016-10-06 – 2016-10-07 (×3): 3 mL via INTRAVENOUS

## 2016-10-06 MED ORDER — SODIUM CHLORIDE 0.9 % IV SOLN
INTRAVENOUS | Status: DC
  Administered 2016-10-06: 1000 mL via INTRAVENOUS
  Administered 2016-10-06: 23:00:00 via INTRAVENOUS

## 2016-10-06 MED ORDER — INSULIN ASPART 100 UNIT/ML ~~LOC~~ SOLN
0.0000 [IU] | Freq: Three times a day (TID) | SUBCUTANEOUS | Status: DC
  Administered 2016-10-06: 1 [IU] via SUBCUTANEOUS
  Administered 2016-10-06: 2 [IU] via SUBCUTANEOUS
  Administered 2016-10-06 – 2016-10-07 (×2): 3 [IU] via SUBCUTANEOUS
  Administered 2016-10-07: 1 [IU] via SUBCUTANEOUS
  Filled 2016-10-06: qty 1
  Filled 2016-10-06 (×2): qty 3
  Filled 2016-10-06: qty 2

## 2016-10-06 MED ORDER — INSULIN GLARGINE 100 UNIT/ML ~~LOC~~ SOLN
8.0000 [IU] | Freq: Every day | SUBCUTANEOUS | Status: DC
  Administered 2016-10-06: 8 [IU] via SUBCUTANEOUS
  Filled 2016-10-06 (×2): qty 0.08

## 2016-10-06 MED ORDER — ACETAMINOPHEN 650 MG RE SUPP: 650.0000 mg | Freq: Four times a day (QID) | RECTAL | Status: DC | PRN

## 2016-10-06 NOTE — ED Provider Notes (Signed)
[facility] Emergency Department Provider Note  ____________________________________________   First MD Initiated Contact with Patient 10/06/16 0408     (approximate)  I have reviewed the triage vital signs and the nursing notes.   HISTORY  Chief Complaint Hypoglycemia and Emesis  Level 5 caveat:  History limited by acute altered mental status  HPI Chad Bailey is a 83 y.o. male with extensive chronic medical history who presents for evaluation of acute altered mental status, hypoglycemia, and "shaking episodes".  He is not able to write any additional history over what the paramedics say.  Reportedly he got up to go to the bathroom and was extremely shaky and confused.  His family checked his blood sugar and found that it was in the low 50s.  They gave him orange juice which he almost immediately vomited back up.  Paramedics gave him an amp of D50 and brought his blood sugar back up to about 180.  He continued to vomit several times in route to the emergency department.  He is ill-appearing but in no acute distress upon arrival.  He is able to tell me that he feels fine and is in no pain.  He denies any difficulty breathing and denies abdominal pain.  However, he also does not remember just vomiting for EMS.  After his family arrived they expressed concern about him not acting like himself recently but he has not had any specific symptoms.   Past Medical History:  Diagnosis Date  . Blindness   . Dysuria   . Fracture of left ankle   . gouty arthritis   . History of tobacco abuse   . Hyperlipidemia   . Hypertension   . Loss of vision 01/2013   left eye  . Non-insulin dependent diabetes mellitus   . PAF (paroxysmal atrial fibrillation) (HCC)    a. on pradaxa; b. CHADS2VASc at least 7 (HTN, age x 2, DM, stroke, vascular disease)  . Retinal artery thrombosis, left 2014  . Thyroid nodule 2014    Patient Active Problem List   Diagnosis Date Noted  .  Altered mental status 10/06/2016  . Benign fibroma of prostate 10/11/2015  . Giant cell arteritis with polymyalgia rheumatica (HCC) 08/31/2015  . Absence of bladder continence 08/28/2015  . Intractable headache 08/23/2015  . Absolute anemia 08/12/2015  . B12 deficiency 07/03/2015  . Glaucoma 06/30/2015  . Peripheral nerve disease (HCC) 06/30/2015  . Nicotine addiction 06/30/2015  . Vision loss, bilateral 12/19/2014  . Inflammation of uveal tract 12/04/2014  . Preoperative cardiovascular examination 06/18/2014  . Bradycardia 06/18/2014  . Orthostasis 03/22/2013  . Peripheral vascular disease in diabetes mellitus (HCC) 03/01/2013  . CVA (cerebral vascular accident) (HCC) 03/01/2013  . 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 History of tobacco abuse   . gouty arthritis   . Hyperlipidemia 11/04/2010  . HYPERTENSION, BENIGN 11/04/2010  . Atrial fibrillation (HCC) 11/04/2010    Past Surgical History:  Procedure Laterality Date  . EYE SURGERY     right  . LASIK     bilateral eye surgery  . left ankle   1996   s/p surgery with screws    Prior to Admission medications   Medication Sig Start Date End Date Taking? Authorizing Provider  acetaminophen (TYLENOL) 500 MG tablet Take by mouth. Reported on 11/01/2015    Historical Provider, MD  acyclovir (ZOVIRAX) 400 MG tablet Take 800 mg by mouth 2 (two) times  daily.     Historical Provider, MD  aspirin 81 MG tablet Take 81 mg by mouth daily. Reported on 11/01/2015    Historical Provider, MD  Brinzolamide-Brimonidine [facility]) 1-0.2 % SUSP Apply to eye 2 (two) times daily.    Historical Provider, MD  Calcium Carbonate-Vitamin D 600-400 MG-UNIT tablet Take by mouth. 08/30/15 08/29/16  Historical Provider, MD  cyanocobalamin (,VITAMIN B-12,) 1000 MCG/ML injection Inject into the muscle.    Historical Provider, MD  Difluprednate (DUREZOL) 0.05 % EMUL Apply to eye 2 (two)  times daily.    Historical Provider, MD  finasteride (PROSCAR) 5 MG tablet TAKE ONE TABLET BY MOUTH ONCE DAILY 05/29/15   Sherlene Shams, MD  glucose blood test strip Check blood sugar twice daily as instructed. Pt has Contour Next EZ. Dx Z61.096 08/16/14   Sherlene Shams, MD  HUMALOG 100 UNIT/ML injection  08/30/15   Historical Provider, MD  Insulin Aspart Prot & Aspart (NOVOLOG MIX 70/30 FLEXPEN) (70-30) 100 UNIT/ML Pen INJECT 14 UNITS INTO THE SKIN TWICE DAILY WITH A MEAL. Patient not taking: Reported on 11/01/2015 10/01/14   Sherlene Shams, MD  insulin glargine (LANTUS) 100 UNIT/ML injection Inject into the skin. 09/02/15 09/01/16  Historical Provider, MD  metFORMIN (GLUCOPHAGE) 1000 MG tablet TAKE ONE TABLET BY MOUTH TWICE DAILY WITH MEALS 05/29/15   Sherlene Shams, MD  metoprolol tartrate (LOPRESSOR) 25 MG tablet Take 0.5 tablets (12.5 mg total) by mouth 2 (two) times daily. 08/23/15   Sondra Barges, PA-C  MICROLET LANCETS MISC USE ONE LANCET TWICE DAILY  Dx code:  E11.9 05/30/15   Sherlene Shams, MD  NOVOFINE 30G X 8 MM MISC USE ONE NEEDLE EACH TIME INSULIN IS INJECTED INTO THE SKIN 05/29/15   Sherlene Shams, MD  pantoprazole (PROTONIX) 40 MG tablet Take by mouth. 10/04/15 10/03/16  Historical Provider, MD  PRADAXA 150 MG CAPS capsule TAKE ONE CAPSULE BY MOUTH EVERY 12 HOURS 05/28/15   Antonieta Iba, MD  predniSONE (DELTASONE) 10 MG tablet Take by mouth.    Historical Provider, MD  tamsulosin (FLOMAX) 0.4 MG CAPS capsule TAKE ONE CAPSULE BY MOUTH ONCE DAILY 08/21/15   Sherlene Shams, MD  timolol (BETIMOL) 0.5 % ophthalmic solution Apply 1 drop to eye 2 (two) times daily.    Historical Provider, MD  travoprost, benzalkonium, (TRAVATAN) 0.004 % ophthalmic solution Apply 1 drop to eye at bedtime.     Historical Provider, MD    Allergies Patient has no known allergies.  Family History  Problem Relation Age of Onset  . Heart disease Mother   . Tuberculosis Mother   . Lung cancer Father   .  Cancer Maternal Grandmother     bowel  . Kidney disease Neg Hx   . Prostate cancer Neg Hx     Social History Social History  Substance Use Topics  . Smoking status: Former Smoker    Packs/day: 6.00    Years: 56.00    Types: Cigarettes, Pipe    Quit date: 10/05/2002  . Smokeless tobacco: Former Neurosurgeon    Types: Chew     Comment: smoked 6 pipes/day;smoked cigarettes 1/2 PPD ,quit chewing tobacco in 2004  . Alcohol use No    Review of Systems Level V caveat: The patient is ill and has acute altered mental status and cannot provide a reliable review of systems ____________________________________________   PHYSICAL EXAM:  ED Triage Vitals  Enc Vitals Group     BP 10/06/16  0409 (!) 145/63     Pulse Rate 10/06/16 0409 78     Resp 10/06/16 0409 20     Temp 10/06/16 0409 98.1 F (36.7 C)     Temp src --      SpO2 10/06/16 0409 96 %     Weight 10/06/16 0407 169 lb 1.5 oz (76.7 kg)     Height 10/06/16 0407 6' (1.829 m)     Head Circumference --      Peak Flow --      Pain Score --      Pain Loc --      Pain Edu? --      Excl. in GC? --      Constitutional: Alert and Is oriented to person and place.  He is elderly and appears ill but nontoxic and he is in no distress Head: Atraumatic. Nose: No congestion/rhinnorhea. Mouth/Throat: Mucous membranes are dry. Neck: No stridor.  No meningeal signs.   Cardiovascular: Normal rate, regular rhythm. Good peripheral circulation. Grossly normal heart sounds. Respiratory: Normal respiratory effort.  No retractions. Lungs CTAB. Gastrointestinal: Soft and nontender. No distention.  Musculoskeletal: No lower extremity tenderness nor edema. No gross deformities of extremities. Neurologic:  Normal speech and language. No gross focal neurologic deficits are appreciated.  Moving all 4 extremities without difficulty.  No facial droop. Skin:  Skin is warm, dry and intact. No rash noted.   ____________________________________________    LABS (all labs ordered are listed, but only abnormal results are displayed)  Labs Reviewed  GLUCOSE, CAPILLARY - Abnormal; Notable for the following:       Result Value   Glucose-Capillary 152 (*)    All other components within normal limits  COMPREHENSIVE METABOLIC PANEL - Abnormal; Notable for the following:    Glucose, Bld 170 (*)    ALT 16 (*)    All other components within normal limits  TROPONIN I - Abnormal; Notable for the following:    Troponin I 0.03 (*)    All other components within normal limits  LACTIC ACID, PLASMA - Abnormal; Notable for the following:    Lactic Acid, Venous 2.8 (*)    All other components within normal limits  PROTIME-INR - Abnormal; Notable for the following:    Prothrombin Time 19.3 (*)    All other components within normal limits  URINALYSIS, COMPLETE (UACMP) WITH MICROSCOPIC - Abnormal; Notable for the following:    Color, Urine STRAW (*)    APPearance CLEAR (*)    Glucose, UA 50 (*)    Bacteria, UA RARE (*)    All other components within normal limits  MAGNESIUM - Abnormal; Notable for the following:    Magnesium 1.2 (*)    All other components within normal limits  CBC WITH DIFFERENTIAL/PLATELET - Abnormal; Notable for the following:    WBC 13.8 (*)    RDW 16.3 (*)    Neutro Abs 12.3 (*)    All other components within normal limits  GLUCOSE, CAPILLARY - Abnormal; Notable for the following:    Glucose-Capillary 136 (*)    All other components within normal limits  GLUCOSE, CAPILLARY - Abnormal; Notable for the following:    Glucose-Capillary 156 (*)    All other components within normal limits  CULTURE, BLOOD (ROUTINE X 2)  CULTURE, BLOOD (ROUTINE X 2)  LIPASE, BLOOD  LACTIC ACID, PLASMA  CBG MONITORING, ED  CBG MONITORING, ED  CBG MONITORING, ED  CBG MONITORING, ED  CBG MONITORING, ED  CBG  MONITORING, ED  CBG MONITORING, ED  CBG MONITORING, ED  CBG MONITORING, ED  CBG MONITORING, ED  CBG MONITORING, ED  CBG MONITORING,  ED  CBG MONITORING, ED  CBG MONITORING, ED  CBG MONITORING, ED  CBG MONITORING, ED  CBG MONITORING, ED  CBG MONITORING, ED  CBG MONITORING, ED   ____________________________________________  EKG  ED ECG REPORT I, Devonne Kitchen, the attending physician, personally viewed and interpreted this ECG.  Date: 10/06/2016 EKG Time: 04:08 Rate: 90 Rhythm: atrial fibrillation QRS Axis: normal Intervals: normal ST/T Wave abnormalities: Non-specific ST segment / T-wave changes, but no evidence of acute ischemia. Conduction Disturbances: none Narrative Interpretation: unremarkable  ____________________________________________  RADIOLOGY   Ct Head Wo Contrast  Result Date: 10/06/2016 CLINICAL DATA:  Disoriented. Vomiting and altered mental status. History of diabetes, hyperlipidemia, hypertension. EXAM: CT HEAD WITHOUT CONTRAST TECHNIQUE: Contiguous axial images were obtained from the base of the skull through the vertex without intravenous contrast. COMPARISON:  CT HEAD August 06, 2015 FINDINGS: BRAIN: The ventricles and sulci are normal for age. No intraparenchymal hemorrhage, mass effect nor midline shift. Small area RIGHT frontal lobe encephalomalacia. Patchy supratentorial white matter hypodensities within normal range for patient's age, though non-specific are most compatible with chronic small vessel ischemic disease. No acute large vascular territory infarcts. No abnormal extra-axial fluid collections. Basal cisterns are patent. VASCULAR: Severe calcific atherosclerosis of the carotid siphons. SKULL: No skull fracture. No significant scalp soft tissue swelling. SINUSES/ORBITS: Moderate lobulated paranasal sinus mucosal thickening. Mastoid air cells are well aerated. Status post RIGHT ocular lens implant. The included ocular globes and orbital contents are non-suspicious. OTHER: None. IMPRESSION: No acute intracranial process. Stable chronic changes including RIGHT frontal lobe infarct.  Electronically Signed   By: Awilda Metro M.D.   On: 10/06/2016 05:12   Dg Chest Portable 1 View  Result Date: 10/06/2016 CLINICAL DATA:  Hypoglycemia tonight. EXAM: PORTABLE CHEST 1 VIEW COMPARISON:  08/06/2015 FINDINGS: A single AP portable view of the chest demonstrates no focal airspace consolidation or alveolar edema. The lungs are grossly clear. There is no large effusion or pneumothorax. Cardiac and mediastinal contours appear unremarkable. IMPRESSION: No active disease. Electronically Signed   By: Ellery Plunk M.D.   On: 10/06/2016 04:59    ____________________________________________   PROCEDURES  Procedure(s) performed:   Procedures   Critical Care performed: No ____________________________________________   INITIAL IMPRESSION / ASSESSMENT AND PLAN / ED COURSE  Pertinent labs & imaging results that were available during my care of the patient were reviewed by me and considered in my medical decision making (see chart for details).  I will evaluate the patient broadly for acute infections as well as obtain a noncontrast head CT to rule out hemorrhage given that he is on per [facility] for his chronic A. fib.  He is denying any pain or shortness of breath at this time.  We will monitor his blood sugar every hour given his symptomatic hypoglycemia.   (Note that documentation was delayed due to multiple ED patients requiring immediate care.)   The patient has a lactic acid of 2.8, leukocytosis of approximately 14, and a very slight troponin elevation 0.03 consistent with demand ischemia.  His urinalysis and chest x-ray were both unremarkable.  He continues to not complain of any acute discomfort.  I provided a liter of fluids, obtained blood cultures, and provided an  Dose of Zosyn given the possibility that he has not as yet unidentified infection causing his lab abnormalities.  However he  may simply have a stress leukocytosis and lactic acid elevation due to and he  suffered at home.  Regardless it is appropriate to observe him in the hospital for fluid hydration, repeat lactic acid measurement, and cycling troponin.  Additionally he remains altered from his baseline according to his wife and son.  I discussed the case with the hospitalist who will admit.  ____________________________________________  FINAL CLINICAL IMPRESSION(S) / ED DIAGNOSES  Final diagnoses:  Altered mental status, unspecified altered mental status type  Elevated lactic acid level  Elevated troponin I level  Leukocytosis, unspecified type  Nausea and vomiting, intractability of vomiting not specified, unspecified vomiting type  Hypoglycemia  Chronic atrial fibrillation (HCC)  Anticoagulated     MEDICATIONS GIVEN DURING THIS VISIT:  Medications  sodium chloride 0.9 % bolus 1,000 mL (0 mLs Intravenous Stopped 10/06/16 0614)  magnesium sulfate IVPB 2 g 50 mL (2 g Intravenous New Bag/Given 10/06/16 0601)  piperacillin-tazobactam (ZOSYN) IVPB 3.375 g (0 g Intravenous Stopped 10/06/16 0644)     NEW OUTPATIENT MEDICATIONS STARTED DURING THIS VISIT:  New Prescriptions   No medications on file    Modified Medications   No medications on file    Discontinued Medications   No medications on file     Note:  This document was prepared using Dragon voice recognition software and may include unintentional dictation errors.    Loleta Rose, MD 10/06/16 [phone]

## 2016-10-06 NOTE — ED Notes (Signed)
POCT CBG 136

## 2016-10-06 NOTE — H&P (Addendum)
Chad Bailey is an 83 y.o. male.   Chief Complaint: Shaking HPI: The patient with past medical history of giant cell arteritis, blindness and diabetes mellitus presents to the emergency department after an episode of shaking followed by unresponsiveness. It started when the patient requested help going to the restroom. His family was able to sit him on the commode at which time he began having some fine tremors of his upper extremities which progressed to shaking of all 4 extremities. Until the full body convulsions began the patient was talking with his family but mildly confused. Is unclear if he urinated while on the toilet. While he was conscious his son states that the patient's blood sugar was 56. He tried to make the patient drink orange juice with little improvement in his blood sugar. Once EMS arrived the patient was unconscious area following dextrose to menstruation in the emergency department the patient's sugar improved and he became oriented and coherent. He is somnolent but easily arousable. Laboratory evaluation showed leukocytosis for which the emergency department staff gave the patient Zosyn prior to calling the hospitalist service for admission.  Past Medical History:  Diagnosis Date  . Blindness   . Dysuria   . Fracture of left ankle   . Giant cell arteritis ([place])   . gouty arthritis   . History of tobacco abuse   . Hyperlipidemia   . Hypertension   . Loss of vision 01/2013   left eye  . Non-insulin dependent diabetes mellitus   . PAF (paroxysmal atrial fibrillation) (HCC)    a. on pradaxa; b. CHADS2VASc at least 7 (HTN, age x 2, DM, stroke, vascular disease)  . Retinal artery thrombosis, left 2014  . Thyroid nodule 2014  . Type 2 diabetes mellitus ([place])     Past Surgical History:  Procedure Laterality Date  . EYE SURGERY     right  . LASIK     bilateral eye surgery  . left ankle   1996   s/p surgery with screws    Family History  Problem Relation Age of  Onset  . Heart disease Mother   . Tuberculosis Mother   . Lung cancer Father   . Cancer Maternal Grandmother     bowel  . Kidney disease Neg Hx   . Prostate cancer Neg Hx    Social History:  reports that he quit smoking about 14 years ago. His smoking use included Cigarettes and Pipe. He has a 336.00 pack-year smoking history. He has quit using smokeless tobacco. His smokeless tobacco use included Chew. He reports that he does not drink alcohol or use drugs.  Allergies: No Known Allergies  Medications Prior to Admission  Medication Sig Dispense Refill  . acetaminophen (TYLENOL) 500 MG tablet Take by mouth. Reported on 11/01/2015    . acyclovir (ZOVIRAX) 400 MG tablet Take 800 mg by mouth 2 (two) times daily.     Marland Kitchen aspirin 81 MG tablet Take 81 mg by mouth daily. Reported on 11/01/2015    . Brinzolamide-Brimonidine (SIMBRINZA) 1-0.2 % SUSP Apply to eye 2 (two) times daily.    . Calcium Carbonate-Vitamin D 600-400 MG-UNIT tablet Take by mouth.    . cyanocobalamin (,VITAMIN B-12,) 1000 MCG/ML injection Inject into the muscle.    . Difluprednate (DUREZOL) 0.05 % EMUL Apply to eye 2 (two) times daily.    . finasteride (PROSCAR) 5 MG tablet TAKE ONE TABLET BY MOUTH ONCE DAILY 30 tablet 0  . glucose blood test strip Check blood  sugar twice daily as instructed. Pt has Contour Next EZ. Dx E11.319 100 each 5  . HUMALOG 100 UNIT/ML injection     . Insulin Aspart Prot & Aspart (NOVOLOG MIX 70/30 FLEXPEN) (70-30) 100 UNIT/ML Pen INJECT 14 UNITS INTO THE SKIN TWICE DAILY WITH A MEAL. (Patient not taking: Reported on 11/01/2015) 15 mL 1  . insulin glargine (LANTUS) 100 UNIT/ML injection Inject into the skin.    . metFORMIN (GLUCOPHAGE) 1000 MG tablet TAKE ONE TABLET BY MOUTH TWICE DAILY WITH MEALS 60 tablet 0  . metoprolol tartrate (LOPRESSOR) 25 MG tablet Take 0.5 tablets (12.5 mg total) by mouth 2 (two) times daily. 30 tablet 3  . MICROLET LANCETS MISC USE ONE LANCET TWICE DAILY  Dx code:  E11.9 100 each  0  . NOVOFINE 30G X 8 MM MISC USE ONE NEEDLE EACH TIME INSULIN IS INJECTED INTO THE SKIN 200 each 0  . pantoprazole (PROTONIX) 40 MG tablet Take by mouth.    Marland Kitchen PRADAXA 150 MG CAPS capsule TAKE ONE CAPSULE BY MOUTH EVERY 12 HOURS 60 capsule 2  . predniSONE (DELTASONE) 10 MG tablet Take by mouth.    . tamsulosin (FLOMAX) 0.4 MG CAPS capsule TAKE ONE CAPSULE BY MOUTH ONCE DAILY 90 capsule 3  . timolol (BETIMOL) 0.5 % ophthalmic solution Apply 1 drop to eye 2 (two) times daily.    . travoprost, benzalkonium, (TRAVATAN) 0.004 % ophthalmic solution Apply 1 drop to eye at bedtime.       Results for orders placed or performed during the hospital encounter of 10/06/16 (from the past 48 hour(s))  Glucose, capillary     Status: Abnormal   Collection Time: 10/06/16  4:06 AM  Result Value Ref Range   Glucose-Capillary 152 (H) 65 - 99 mg/dL  Comprehensive metabolic panel     Status: Abnormal   Collection Time: 10/06/16  4:09 AM  Result Value Ref Range   Sodium 136 135 - 145 mmol/L   Potassium 3.8 3.5 - 5.1 mmol/L   Chloride 102 101 - 111 mmol/L   CO2 26 22 - 32 mmol/L   Glucose, Bld 170 (H) 65 - 99 mg/dL   BUN 20 6 - 20 mg/dL   Creatinine, Ser 0.95 0.61 - 1.24 mg/dL   Calcium 9.4 8.9 - 10.3 mg/dL   Total Protein 6.8 6.5 - 8.1 g/dL   Albumin 3.9 3.5 - 5.0 g/dL   AST 29 15 - 41 U/L   ALT 16 (L) 17 - 63 U/L   Alkaline Phosphatase 47 38 - 126 U/L   Total Bilirubin 1.0 0.3 - 1.2 mg/dL   GFR calc non Af Amer >60 >60 mL/min   GFR calc Af Amer >60 >60 mL/min    Comment: (NOTE) The eGFR has been calculated using the CKD EPI equation. This calculation has not been validated in all clinical situations. eGFR's persistently <60 mL/min signify possible Chronic Kidney Disease.    Anion gap 8 5 - 15  Lipase, blood     Status: None   Collection Time: 10/06/16  4:09 AM  Result Value Ref Range   Lipase 17 11 - 51 U/L  Troponin I     Status: Abnormal   Collection Time: 10/06/16  4:09 AM  Result Value  Ref Range   Troponin I 0.03 (HH) <0.03 ng/mL    Comment: CRITICAL RESULT CALLED TO, READ BACK BY AND VERIFIED WITH JENNA ELLINGTON ON 10/06/16 AT 0439 QSD   Lactic acid, plasma  Status: Abnormal   Collection Time: 10/06/16  4:09 AM  Result Value Ref Range   Lactic Acid, Venous 2.8 (HH) 0.5 - 1.9 mmol/L    Comment: CRITICAL RESULT CALLED TO, READ BACK BY AND VERIFIED WITH JENNA ELLINGTON ON 10/06/16 AT 0459 QSD   Protime-INR     Status: Abnormal   Collection Time: 10/06/16  4:09 AM  Result Value Ref Range   Prothrombin Time 19.3 (H) 11.4 - 15.2 seconds   INR 1.61   Urinalysis, Complete w Microscopic     Status: Abnormal   Collection Time: 10/06/16  4:09 AM  Result Value Ref Range   Color, Urine STRAW (A) YELLOW   APPearance CLEAR (A) CLEAR   Specific Gravity, Urine 1.009 1.005 - 1.030   pH 5.0 5.0 - 8.0   Glucose, UA 50 (A) NEGATIVE mg/dL   Hgb urine dipstick NEGATIVE NEGATIVE   Bilirubin Urine NEGATIVE NEGATIVE   Ketones, ur NEGATIVE NEGATIVE mg/dL   Protein, ur NEGATIVE NEGATIVE mg/dL   Nitrite NEGATIVE NEGATIVE   Leukocytes, UA NEGATIVE NEGATIVE   RBC / HPF 0-5 0 - 5 RBC/hpf   WBC, UA 0-5 0 - 5 WBC/hpf   Bacteria, UA RARE (A) NONE SEEN   Squamous Epithelial / LPF NONE SEEN NONE SEEN  Magnesium     Status: Abnormal   Collection Time: 10/06/16  4:09 AM  Result Value Ref Range   Magnesium 1.2 (L) 1.7 - 2.4 mg/dL  CBC with Differential/Platelet     Status: Abnormal   Collection Time: 10/06/16  4:09 AM  Result Value Ref Range   WBC 13.8 (H) 3.8 - 10.6 K/uL   RBC 5.15 4.40 - 5.90 MIL/uL   Hemoglobin 15.0 13.0 - 18.0 g/dL   HCT 45.2 40.0 - 52.0 %   MCV 87.7 80.0 - 100.0 fL   MCH 29.1 26.0 - 34.0 pg   MCHC 33.2 32.0 - 36.0 g/dL   RDW 16.3 (H) 11.5 - 14.5 %   Platelets 235 150 - 440 K/uL   Neutrophils Relative % 89 %   Neutro Abs 12.3 (H) 1.4 - 6.5 K/uL   Lymphocytes Relative 8 %   Lymphs Abs 1.0 1.0 - 3.6 K/uL   Monocytes Relative 2 %   Monocytes Absolute 0.2 0.2 -  1.0 K/uL   Eosinophils Relative 1 %   Eosinophils Absolute 0.2 0 - 0.7 K/uL   Basophils Relative 0 %   Basophils Absolute 0.1 0 - 0.1 K/uL  Glucose, capillary     Status: Abnormal   Collection Time: 10/06/16  5:13 AM  Result Value Ref Range   Glucose-Capillary 136 (H) 65 - 99 mg/dL  Glucose, capillary     Status: Abnormal   Collection Time: 10/06/16  6:24 AM  Result Value Ref Range   Glucose-Capillary 156 (H) 65 - 99 mg/dL  Lactic acid, plasma     Status: None   Collection Time: 10/06/16  7:05 AM  Result Value Ref Range   Lactic Acid, Venous 1.8 0.5 - 1.9 mmol/L  Glucose, capillary     Status: Abnormal   Collection Time: 10/06/16  7:12 AM  Result Value Ref Range   Glucose-Capillary 154 (H) 65 - 99 mg/dL   Ct Head Wo Contrast  Result Date: 10/06/2016 CLINICAL DATA:  Disoriented. Vomiting and altered mental status. History of diabetes, hyperlipidemia, hypertension. EXAM: CT HEAD WITHOUT CONTRAST TECHNIQUE: Contiguous axial images were obtained from the base of the skull through the vertex without intravenous contrast. COMPARISON:  CT HEAD August 06, 2015 FINDINGS: BRAIN: The ventricles and sulci are normal for age. No intraparenchymal hemorrhage, mass effect nor midline shift. Small area RIGHT frontal lobe encephalomalacia. Patchy supratentorial white matter hypodensities within normal range for patient's age, though non-specific are most compatible with chronic small vessel ischemic disease. No acute large vascular territory infarcts. No abnormal extra-axial fluid collections. Basal cisterns are patent. VASCULAR: Severe calcific atherosclerosis of the carotid siphons. SKULL: No skull fracture. No significant scalp soft tissue swelling. SINUSES/ORBITS: Moderate lobulated paranasal sinus mucosal thickening. Mastoid air cells are well aerated. Status post RIGHT ocular lens implant. The included ocular globes and orbital contents are non-suspicious. OTHER: None. IMPRESSION: No acute intracranial  process. Stable chronic changes including RIGHT frontal lobe infarct. Electronically Signed   By: Elon Alas M.D.   On: 10/06/2016 05:12   Dg Chest Portable 1 View  Result Date: 10/06/2016 CLINICAL DATA:  Hypoglycemia tonight. EXAM: PORTABLE CHEST 1 VIEW COMPARISON:  08/06/2015 FINDINGS: A single AP portable view of the chest demonstrates no focal airspace consolidation or alveolar edema. The lungs are grossly clear. There is no large effusion or pneumothorax. Cardiac and mediastinal contours appear unremarkable. IMPRESSION: No active disease. Electronically Signed   By: Andreas Newport M.D.   On: 10/06/2016 04:59    Review of Systems  Constitutional: Negative for chills and fever.  HENT: Negative for sore throat and tinnitus.   Eyes: Negative for blurred vision and redness.  Respiratory: Negative for cough and shortness of breath.   Cardiovascular: Negative for chest pain, palpitations, orthopnea and PND.  Gastrointestinal: Positive for vomiting. Negative for abdominal pain, diarrhea and nausea.  Genitourinary: Negative for dysuria, frequency and urgency.  Musculoskeletal: Negative for joint pain and myalgias.  Skin: Negative for rash.       No lesions  Neurological: Positive for seizures (??), loss of consciousness and weakness. Negative for speech change and focal weakness.  Endo/Heme/Allergies: Does not bruise/bleed easily.       No temperature intolerance  Psychiatric/Behavioral: Negative for depression and suicidal ideas.    Blood pressure (!) 102/55, pulse 88, temperature 98.1 F (36.7 C), resp. rate 17, height 6' (1.829 m), weight 76.7 kg (169 lb 1.5 oz), SpO2 95 %. Physical Exam  Constitutional: He is oriented to person, place, and time. He appears well-developed and well-nourished. No distress.  HENT:  Head: Normocephalic and atraumatic.  Mouth/Throat: Oropharynx is clear and moist.  Eyes: Conjunctivae and EOM are normal. Pupils are equal, round, and reactive to  light. No scleral icterus.  Neck: Normal range of motion. Neck supple. No JVD present. No tracheal deviation present. No thyromegaly present.  Cardiovascular: Normal rate, regular rhythm and normal heart sounds.  Exam reveals no gallop and no friction rub.   No murmur heard. Respiratory: Effort normal and breath sounds normal. No respiratory distress. He has no wheezes.  GI: Soft. Bowel sounds are normal. He exhibits no distension. There is no tenderness.  Genitourinary:  Genitourinary Comments: Deferred  Musculoskeletal: Normal range of motion. He exhibits no edema.  Lymphadenopathy:    He has no cervical adenopathy.  Neurological: He is alert and oriented to person, place, and time. No cranial nerve deficit.  Skin: Skin is warm and dry. No rash noted. No erythema.  Psychiatric: He has a normal mood and affect. His behavior is normal. Judgment and thought content normal.     Assessment/Plan This is an 83 year old male admitted for hypoglycemia and altered mental status. 1. Hypoglycemia: Resolved; continue to monitor  blood sugar. I have not limited carbohydrates for the patient's hospital diet. 2. Altered mental status: An episode of unresponsiveness following tremors with activity consistent with seizure like behavior; likely secondary to hypoglycemia. The patient is no longer postictal. 3. Diabetes mellitus type II: Hold oral hypoglycemic agents. Adjust basal insulin as well as sliding scale for hospital 4. Adrenal insufficiency: The patient has been taking prednisone for over a year to control vasculitis. His son states that he adjusted his prednisone dose by 1 mg the day before admission. This is a small change but in an elderly patient may have been enough to cause hypoglycemia and or hypotension especially in light of unadjusted insulin dosing. I will place the patient back on prednisone 4 mg by mouth daily. 5. Leukocytosis: Differential diagnosis includes demargination secondary to  prednisone therapy; also potential response to seizure activity. The patient has received antibiotics in the emergency department. The patient is afebrile. He does not meet criteria for sepsis, however blood cultures were obtained. Monitor clinical progress. Follow blood cultures for growth and sensitivities. I have not continued antibiotics at this time as there is no source of infection. Check influenza screen. 6. Atrial fibrillation: Rate controlled; continue Pradaxa and metoprolol  7. Glaucoma: Continue Travatan eyedrops 8. BPH: Continue Flomax and finasteride 9. DVT prophylaxis: Full anticoagulation as above 10. GI prophylaxis: None The patient is a full code. Time spent on admission orders and patient care proximally 45 minutes  Harrie Foreman, MD 10/06/2016, 7:50 AM

## 2016-10-06 NOTE — Progress Notes (Addendum)
[facility] [place] at [facility]   PATIENT NAME: Chad Bailey    MR#:  [id number]  DATE OF BIRTH:  Dec 26, 1929  SUBJECTIVE:  CHIEF COMPLAINT:   Chief Complaint  Patient presents with  . Hypoglycemia  . Emesis  the patient is 83y male with PMH of cell arteritis, blindness and diabetes mellitus presents emergency department after an episode of shaking followed by unresponsiveness. Apparently hgis prednisone dose was recently reduced and he was noted to have low blood glucose levels and shakiness, EMS administered dextrose and his condition improved. He feels good today, wants to go home.    Review of Systems  Constitutional: Negative for chills, fever and weight loss.  HENT: Negative for congestion.   Eyes: Negative for blurred vision and double vision.  Respiratory: Negative for cough, sputum production, shortness of breath and wheezing.   Cardiovascular: Negative for chest pain, palpitations, orthopnea, leg swelling and PND.  Gastrointestinal: Negative for abdominal pain, blood in stool, constipation, diarrhea, nausea and vomiting.  Genitourinary: Negative for dysuria, frequency, hematuria and urgency.  Musculoskeletal: Negative for falls.  Neurological: Negative for dizziness, tremors, focal weakness and headaches.  Endo/Heme/Allergies: Does not bruise/bleed easily.  Psychiatric/Behavioral: Negative for depression. The patient does not have insomnia.     VITAL SIGNS: Blood pressure (!) 96/50, pulse 65, temperature 98.6 F (37 C), temperature source Oral, resp. rate 17, height 6' (1.829 m), weight 73.9 kg (163 lb), SpO2 94 %.  PHYSICAL EXAMINATION:   GENERAL:  83 y.o.-year-old patient lying in the bed with no acute distress.  EYES: Pupils equal, round, reactive to light and accommodation. No scleral icterus. Extraocular muscles intact.  HEENT: Head atraumatic, normocephalic. Oropharynx and nasopharynx clear.  NECK:  Supple, no jugular venous  distention. No thyroid enlargement, no tenderness.  LUNGS: Normal breath sounds bilaterally, no wheezing, rales,rhonchi or crepitation. No use of accessory muscles of respiration.  CARDIOVASCULAR: S1, S2 normal. No murmurs, rubs, or gallops.  ABDOMEN: Soft, nontender, nondistended. Bowel sounds present. No organomegaly or mass.  EXTREMITIES: No pedal edema, cyanosis, or clubbing.  NEUROLOGIC: Cranial nerves II through XII are intact. Muscle strength 5/5 in all extremities. Sensation intact. Gait not checked.  PSYCHIATRIC: The patient is alert and oriented x 3.  SKIN: No obvious rash, lesion, or ulcer.   ORDERS/RESULTS REVIEWED:   CBC  Recent Labs Lab 10/06/16 0409  WBC 13.8*  HGB 15.0  HCT 45.2  PLT 235  MCV 87.7  MCH 29.1  MCHC 33.2  RDW 16.3*  LYMPHSABS 1.0  MONOABS 0.2  EOSABS 0.2  BASOSABS 0.1   ------------------------------------------------------------------------------------------------------------------  Chemistries   Recent Labs Lab 10/06/16 0409  NA 136  K 3.8  CL 102  CO2 26  GLUCOSE 170*  BUN 20  CREATININE 0.95  CALCIUM 9.4  MG 1.2*  AST 29  ALT 16*  ALKPHOS 47  BILITOT 1.0   ------------------------------------------------------------------------------------------------------------------ estimated creatinine clearance is 60.5 mL/min (by C-G formula based on SCr of 0.95 mg/dL). ------------------------------------------------------------------------------------------------------------------  Recent Labs  10/06/16 0819  TSH 0.705    Cardiac Enzymes  Recent Labs Lab 10/06/16 0409 10/06/16 0819 10/06/16 1351  TROPONINI 0.03* 0.04* 0.04*   ------------------------------------------------------------------------------------------------------------------ Invalid input(s): POCBNP ---------------------------------------------------------------------------------------------------------------  RADIOLOGY: Ct Head Wo Contrast  Result Date:  10/06/2016 CLINICAL DATA:  Disoriented. Vomiting and altered mental status. History of diabetes, hyperlipidemia, hypertension. EXAM: CT HEAD WITHOUT CONTRAST TECHNIQUE: Contiguous axial images were obtained from the base of the skull through the vertex without intravenous contrast. COMPARISON:  CT  HEAD August 06, 2015 FINDINGS: BRAIN: The ventricles and sulci are normal for age. No intraparenchymal hemorrhage, mass effect nor midline shift. Small area RIGHT frontal lobe encephalomalacia. Patchy supratentorial white matter hypodensities within normal range for patient's age, though non-specific are most compatible with chronic small vessel ischemic disease. No acute large vascular territory infarcts. No abnormal extra-axial fluid collections. Basal cisterns are patent. VASCULAR: Severe calcific atherosclerosis of the carotid siphons. SKULL: No skull fracture. No significant scalp soft tissue swelling. SINUSES/ORBITS: Moderate lobulated paranasal sinus mucosal thickening. Mastoid air cells are well aerated. Status post RIGHT ocular lens implant. The included ocular globes and orbital contents are non-suspicious. OTHER: None. IMPRESSION: No acute intracranial process. Stable chronic changes including RIGHT frontal lobe infarct. Electronically Signed   By: Awilda Metroourtnay  Bloomer M.D.   On: 10/06/2016 05:12   Dg Chest Portable 1 View  Result Date: 10/06/2016 CLINICAL DATA:  Hypoglycemia tonight. EXAM: PORTABLE CHEST 1 VIEW COMPARISON:  08/06/2015 FINDINGS: A single AP portable view of the chest demonstrates no focal airspace consolidation or alveolar edema. The lungs are grossly clear. There is no large effusion or pneumothorax. Cardiac and mediastinal contours appear unremarkable. IMPRESSION: No active disease. Electronically Signed   By: Ellery Plunkaniel R Mitchell M.D.   On: 10/06/2016 04:59    EKG:  Orders placed or performed during the hospital encounter of 10/06/16  . ED EKG  . ED EKG  . EKG 12-Lead  . EKG 12-Lead     ASSESSMENT AND PLAN:  Active Problems:   Altered mental status  1. AMS, likely hypoglycemia related, no obvious infection, blood cx negative so far, resolved 2. Hypotension, continue metoprolol, hold if BP is low, chronically low 3. Shakiness, possibly related to hypoglycemia 4. Elevated troponin, likely demand ischemia, get echo, cardiology consult if needed 5. Hypomagnesemia, supplementing intravenously, recheck in the morning  Management plans discussed with the patient, family and they are in agreement.   DRUG ALLERGIES: No Known Allergies  CODE STATUS:     Code Status Orders        Start     Ordered   10/06/16 0758  Full code  Continuous     10/06/16 0757    Code Status History    Date Active Date Inactive Code Status Order ID Comments User Context   This patient has a current code status but no historical code status.    Advance Directive Documentation   Flowsheet Row Most Recent Value  Type of Advance Directive  Living will  Pre-existing out of facility DNR order (yellow form or pink MOST form)  No data  "MOST" Form in Place?  No data      TOTAL TIME TAKING CARE OF THIS PATIENT: 40 minutes.    Katharina CaperVAICKUTE,Oddis Westling M.D on 10/06/2016 at 6:01 PM  Between 7am to 6pm - Pager - [phone]  After 6pm go to [email or web address] - password [email or web address] [facility]  [facility]  Office  [phone]  CC: Primary care physician; Sherlene ShamsULLO, TERESA L, MD

## 2016-10-06 NOTE — Care Management Obs Status (Signed)
MEDICARE OBSERVATION STATUS NOTIFICATION   Patient Details  Name: Chad Bailey MRN: [id number] Date of Birth: 11/07/1929   Medicare Observation Status Notification Given:  Yes    Chapman FitchBOWEN, Daphene Chisholm T, RN 10/06/2016, 4:28 PM

## 2016-10-06 NOTE — Progress Notes (Signed)
Dr. Winona LegatoVaickute at the bedside and wants a post void bladder scan the next time patient goes to the bathroom

## 2016-10-06 NOTE — Progress Notes (Signed)
Patient voided 150 cc's of clear yellow urine and bladder scan done patient had 78cc's of urine.

## 2016-10-06 NOTE — ED Notes (Signed)
Family reports patient normally aware of place/situation.

## 2016-10-06 NOTE — ED Notes (Signed)
Pt transporting pt to room 204

## 2016-10-06 NOTE — ED Notes (Signed)
POCT CBG 152

## 2016-10-06 NOTE — ED Notes (Signed)
Patient disoriented to place/situation. Pt believes he is at home in his "comfort chair". Pt addressed RN as "[name]".

## 2016-10-06 NOTE — ED Triage Notes (Signed)
Per EMS: Pt had hypoglycemic episode CBG 52. Patient's family gave patient orange juice. Pt vomited twice in route to ED.

## 2016-10-07 ENCOUNTER — Observation Stay (HOSPITAL_BASED_OUTPATIENT_CLINIC_OR_DEPARTMENT_OTHER)
Admit: 2016-10-07 | Discharge: 2016-10-07 | Disposition: A | Discharge status: Home / Self Care | Payer: Medicare Other | Attending: Internal Medicine | Admitting: Internal Medicine

## 2016-10-07 DIAGNOSIS — R251 Tremor, unspecified: Secondary | ICD-10-CM

## 2016-10-07 DIAGNOSIS — R079 Chest pain, unspecified: Secondary | ICD-10-CM

## 2016-10-07 DIAGNOSIS — R4182 Altered mental status, unspecified: Secondary | ICD-10-CM | POA: Diagnosis not present

## 2016-10-07 DIAGNOSIS — I959 Hypotension, unspecified: Secondary | ICD-10-CM

## 2016-10-07 DIAGNOSIS — R7989 Other specified abnormal findings of blood chemistry: Secondary | ICD-10-CM

## 2016-10-07 DIAGNOSIS — D72829 Elevated white blood cell count, unspecified: Secondary | ICD-10-CM

## 2016-10-07 DIAGNOSIS — E162 Hypoglycemia, unspecified: Secondary | ICD-10-CM

## 2016-10-07 DIAGNOSIS — R778 Other specified abnormalities of plasma proteins: Secondary | ICD-10-CM

## 2016-10-07 LAB — GLUCOSE, CAPILLARY
Glucose-Capillary: 141 mg/dL — ABNORMAL HIGH (ref 65–99)
Glucose-Capillary: 245 mg/dL — ABNORMAL HIGH (ref 65–99)

## 2016-10-07 LAB — CBC
HCT: 35.7 % — ABNORMAL LOW (ref 40.0–52.0)
Hemoglobin: 11.8 g/dL — ABNORMAL LOW (ref 13.0–18.0)
MCH: 29.4 pg (ref 26.0–34.0)
MCHC: 33 g/dL (ref 32.0–36.0)
MCV: 89.1 fL (ref 80.0–100.0)
PLATELETS: 194 10*3/uL (ref 150–440)
RBC: 4.01 MIL/uL — AB (ref 4.40–5.90)
RDW: 16.5 % — ABNORMAL HIGH (ref 11.5–14.5)
WBC: 18.4 10*3/uL — AB (ref 3.8–10.6)

## 2016-10-07 LAB — ECHOCARDIOGRAM COMPLETE
HEIGHTINCHES: 72 in
WEIGHTICAEL: 2766.4 [oz_av]

## 2016-10-07 MED ORDER — PREDNISONE 1 MG PO TABS
3.0000 mg | ORAL_TABLET | Freq: Every day | ORAL | 0 refills | Status: DC
Start: 2016-10-08 — End: 2020-05-03

## 2016-10-07 MED ORDER — INSULIN GLARGINE 100 UNIT/ML ~~LOC~~ SOLN: 10.0000 [IU] | Freq: Every day | SUBCUTANEOUS | 11 refills | Status: AC

## 2016-10-07 NOTE — Progress Notes (Signed)
CH responding to an order visit Pt in Rm 204. Pt's wife at bedside. Pt had requested for Ad, but [facility] told [facility] he had completed an AD before, which he kept at home and did not need one. CH offered prayers for the Pt and wife, which was warmly accepted.    10/07/16 1600  Clinical Encounter Type  Visited With Patient;Patient and family together  Visit Type Initial;Follow-up;ED  Referral From Nurse  Consult/Referral To Chaplain  Spiritual Encounters  Spiritual Needs Brochure;Prayer

## 2016-10-07 NOTE — Progress Notes (Signed)
Patient discharged to home as ordered. Insulin instructions given as ordered. Patient is to receive home health visits after discharge. Patients son at the bedside to take patient and his wife home. Patient is alert and oriented no acute distress noted.

## 2016-10-07 NOTE — Progress Notes (Signed)
*  PRELIMINARY RESULTS* Echocardiogram 2D Echocardiogram has been performed.  Cristela BlueHege, Xitlaly Ault 10/07/2016, 3:20 PM

## 2016-10-07 NOTE — Plan of Care (Signed)
Problem: Physical Regulation: Goal: Ability to maintain clinical measurements within normal limits will improve Outcome: Progressing A/O X4; slept well overnight; Telemetry NSR overnight;

## 2016-10-07 NOTE — Evaluation (Signed)
Physical Therapy Evaluation Patient Details Name: Chad Bailey MRN: [id number] DOB: 1930/05/28 Today's Date: 10/07/2016   History of Present Illness  83 yo male with onset of AMS after hypoglycemic episode, with leukocytosis, adrenal insufficiency.  PMHx:  giant cell arteritis, B blindness, L ankle fracture, PAF, retinal thrombosis  Clinical Impression  Pt is up with PT first to bedside then to chair with minor help and wife in attendance to observe his level of assistance.  Pt is in a challenging situation as he has been off his feet for a length of days, now having PT assist him up to short trips to avoid overdoing.  He will continue to outpatient after PT at home, hopefully as PT recommended.  Will follow acutely to increase strength and standing balance control.      Follow Up Recommendations Home health PT;Supervision for mobility/OOB    Equipment Recommendations  None recommended by PT    Recommendations for Other Services       Precautions / Restrictions Precautions Precautions: Fall (blindness) Precaution Comments: used SPC at baseline but needs RW Restrictions Weight Bearing Restrictions: No      Mobility  Bed Mobility Overal bed mobility: Needs Assistance Bed Mobility: Supine to Sit;Sit to Supine     Supine to sit: Mod assist     General bed mobility comments: assisted to reach over to rail and to sit up bedside  Transfers Overall transfer level: Needs assistance Equipment used: Rolling walker (2 wheeled);1 person hand held assist Transfers: Sit to/from [employer] Sit to Stand: Min assist Stand pivot transfers: Min assist       General transfer comment: cues for hand placement 100% of the time  Ambulation/Gait Ambulation/Gait assistance: Min guard Ambulation Distance (Feet): 60 Feet (in 10' lengths) Assistive device: Rolling walker (2 wheeled);1 person hand held assist Gait Pattern/deviations: Step-through pattern;Step-to pattern;Wide  base of support;Trunk flexed;Shuffle Gait velocity: reduced Gait velocity interpretation: Below normal speed for age/gender General Gait Details: pt is in flexion contractures at the hips which affect his ability to step backward  Stairs            Wheelchair Mobility    Modified Rankin (Stroke Patients Only)       Balance Overall balance assessment: Needs assistance Sitting-balance support: Bilateral upper extremity supported;Feet supported Sitting balance-Leahy Scale: Fair   Postural control: Posterior lean Standing balance support: Bilateral upper extremity supported Standing balance-Leahy Scale: Poor Standing balance comment: improves with RW to control better                             Pertinent Vitals/Pain Pain Assessment: No/denies pain    Home Living Family/patient expects to be discharged to:: Private residence Living Arrangements: Spouse/significant other;Children Available Help at Discharge: Family;Available 24 hours/day Type of Home: House Home Access: Ramped entrance     Home Layout: One level Home Equipment: Walker - 2 wheels;Walker - 4 wheels;Wheelchair - Geophysical data processor      Prior Function Level of Independence: Needs assistance   Gait / Transfers Assistance Needed: used SPC or walker at home at times, but recently transfers only  ADL's / Homemaking Assistance Needed: Wife cares for the home        Hand Dominance        Extremity/Trunk Assessment   Upper Extremity Assessment Upper Extremity Assessment: Overall WFL for tasks assessed (does have OA in shoulders)    Lower Extremity Assessment Lower Extremity Assessment: Generalized  weakness    Cervical / Trunk Assessment Cervical / Trunk Assessment: Normal  Communication   Communication: No difficulties  Cognition Arousal/Alertness: Awake/alert Behavior During Therapy: WFL for tasks assessed/performed Overall Cognitive Status: Within Functional Limits for tasks  assessed                      General Comments General comments (skin integrity, edema, etc.): Pt is up to walk with controlled sats 97% with all mobility.    Exercises     Assessment/Plan    PT Assessment Patient needs continued PT services  PT Problem List Decreased strength;Decreased range of motion;Decreased activity tolerance;Decreased balance;Decreased mobility;Decreased coordination;Decreased knowledge of use of DME;Decreased safety awareness;Obesity          PT Treatment Interventions DME instruction;Gait training;Functional mobility training;Therapeutic activities;Therapeutic exercise;Balance training;Neuromuscular re-education;Patient/family education    PT Goals (Current goals can be found in the Care Plan section)  Acute Rehab PT Goals Patient Stated Goal: to get up to walk again PT Goal Formulation: With patient/family Time For Goal Achievement: 10/21/16 Potential to Achieve Goals: Good    Frequency Min 2X/week   Barriers to discharge Decreased caregiver support (wife is on SPC's at times)      Co-evaluation               End of Session Equipment Utilized During Treatment: Gait belt Activity Tolerance: Patient tolerated treatment well;Patient limited by fatigue Patient left: in chair;with call bell/phone within reach;with family/visitor present Nurse Communication: Mobility status    Functional Assessment Tool Used: clinical judgment Functional Limitation: Mobility: Walking and moving around Mobility: Walking and Moving Around Current Status [phone](G8978): At least 40 percent but less than 60 percent impaired, limited or restricted Mobility: Walking and Moving Around Goal Status [phone](G8979): At least 1 percent but less than 20 percent impaired, limited or restricted    Time: 0929-0959 PT Time Calculation (min) (ACUTE ONLY): 30 min   Charges:   PT Evaluation $PT Eval Moderate Complexity: 1 Procedure PT Treatments $Gait Training: 8-22 mins   PT G  Codes:   PT G-Codes **NOT FOR INPATIENT CLASS** Functional Assessment Tool Used: clinical judgment Functional Limitation: Mobility: Walking and moving around Mobility: Walking and Moving Around Current Status (H0865(G8978): At least 40 percent but less than 60 percent impaired, limited or restricted Mobility: Walking and Moving Around Goal Status [phone](G8979): At least 1 percent but less than 20 percent impaired, limited or restricted    Ivar DrapeStout, Chad Bailey 10/07/2016, 11:19 AM   Samul Dadauth Soraiya Ahner, PT MS Acute Rehab Dept. Number: [facility] [phone] and [facility] [phone]

## 2016-10-07 NOTE — Discharge Summary (Signed)
[facility] - [place] at [facility]   PATIENT NAME: Chad Bailey    MR#:  [id number]  DATE OF BIRTH:  04-04-1930  DATE OF ADMISSION:  10/06/2016 ADMITTING PHYSICIAN: Arnaldo Natal, MD  DATE OF DISCHARGE: 10/07/2016  4:44 PM  PRIMARY CARE PHYSICIAN: Sherlene Shams, MD     ADMISSION DIAGNOSIS:  Hypoglycemia [E16.2] Chronic atrial fibrillation (HCC) [I48.2] Anticoagulated [Z79.01] Elevated troponin I level [R74.8] Elevated lactic acid level [R79.89] Altered mental status, unspecified altered mental status type [R41.82] Leukocytosis, unspecified type [D72.829] Nausea and vomiting, intractability of vomiting not specified, unspecified vomiting type [R11.2]  DISCHARGE DIAGNOSIS:  Principal Problem:   Altered mental status Active Problems:   Hypoglycemia   Hypotension   Leukocytosis   Elevated troponin   Shakiness   Hypomagnesemia   SECONDARY DIAGNOSIS:   Past Medical History:  Diagnosis Date  . Blindness   . Dysuria   . Fracture of left ankle   . Giant cell arteritis (HCC)   . gouty arthritis   . History of tobacco abuse   . Hyperlipidemia   . Hypertension   . Loss of vision 01/2013   left eye  . Non-insulin dependent diabetes mellitus   . PAF (paroxysmal atrial fibrillation) (HCC)    a. on pradaxa; b. CHADS2VASc at least 7 (HTN, age x 2, DM, stroke, vascular disease)  . Retinal artery thrombosis, left 2014  . Thyroid nodule 2014  . Type 2 diabetes mellitus (HCC)     .pro HOSPITAL COURSE:   the patient is 83y male with PMH of Giant cell arteritis, blindnessand diabetes mellitus presents emergency department after an episode of shaking followed by unresponsiveness. Apparently hgis prednisone dose was recently reduced and he was noted to have low blood glucose levels and shakiness, EMS administered dextrose and his condition improved. His chest x-ray was normal, as well as urinalysis. Blood cultures were taken and they were negative. CT  of the head showed old right frontal lobe infarct, no acute changes. Patient's labs revealed elevated troponin, echocardiogram was performed during this admission, revealed normal ejection fraction, mild mitral regurgitation, mildly elevated pulmonary arterial pressure. Patient's oxygenation was relatively stable at 92-94% on room air. Blood pressure as well as heart rate were normal. It was felt that patient's presentation was consistent with hypoglycemia episode. Patient's insulin Lantus was decreased to 10 units due to decreasing doses of prednisone, patient and family were educated about a relationship between prednisone and diabetes and her voice understanding. Discussion by problem: 1. AMS, likely hypoglycemia related, no obvious infection, blood cx negative so far, resolved 2. Hypotension, resolved on IV fluids, continue metoprolol at home doses 3. Shakiness, suspected related to hypoglycemia, blood cultures were negative, no other symptoms, including no fevers were reported while in the hospital 4. Elevated troponin, likely demand ischemia, unremarkable echo, except of mild mitral regurgitation, mildly elevated pulmonary arterial pressures 5. Hypomagnesemia, supplemented intravenously 6. Generalized weakness, home health services are going to be arranged upon discharge, patient and family were advised to return back to the hospital or follow up with Dr. Audelia Acton if any symptoms arise or condition changes 7. Leukocytosis, etiology remains unclear, patient and family were advised to return back to the hospital or primary care physician if any changes in condition, no antibiotics were prescribed upon discharge DISCHARGE CONDITIONS:   Stable  CONSULTS OBTAINED:    DRUG ALLERGIES:  No Known Allergies  DISCHARGE MEDICATIONS:   Discharge Medication List as of 10/07/2016  2:10 PM  CONTINUE these medications which have CHANGED   Details  insulin glargine (LANTUS) 100 UNIT/ML injection Inject 0.1  mLs (10 Units total) into the skin at bedtime. Please taper off insulin Lantus by 2 units if you are tapering off prednisone, following your blood glucose levels,  Thank you, Starting Wed 10/07/2016, Normal    predniSONE (DELTASONE) 1 MG tablet Take 3 tablets (3 mg total) by mouth daily with breakfast., Starting Thu 10/08/2016, No Print      CONTINUE these medications which have NOT CHANGED   Details  acetaminophen (TYLENOL) 500 MG tablet Take by mouth. Reported on 11/01/2015, Historical Med    atorvastatin (LIPITOR) 10 MG tablet Take 1 tablet by mouth daily., Starting Wed 09/30/2016, Historical Med    Brinzolamide-Brimonidine (SIMBRINZA) 1-0.2 % SUSP Apply to eye 2 (two) times daily., Historical Med    Calcium Carbonate-Vitamin D 600-400 MG-UNIT tablet Take 1 tablet by mouth 2 (two) times daily. , Starting Fri 08/30/2015, Until Tue 10/06/2016, Historical Med    cyanocobalamin (,VITAMIN B-12,) 1000 MCG/ML injection Inject 1,000 mcg into the muscle every 30 (thirty) days., Starting Fri 09/25/2016, Until Mon 09/20/2017, Historical Med    ferrous sulfate 325 (65 FE) MG tablet Take 1 tablet by mouth 2 (two) times daily., Historical Med    finasteride (PROSCAR) 5 MG tablet TAKE ONE TABLET BY MOUTH ONCE DAILY, Normal    HUMALOG 100 UNIT/ML injection Inject 8 Units into the skin 3 (three) times daily with meals. , Starting Fri 08/30/2015, Historical Med    metFORMIN (GLUCOPHAGE) 1000 MG tablet TAKE ONE TABLET BY MOUTH TWICE DAILY WITH MEALS, Normal    metoprolol tartrate (LOPRESSOR) 25 MG tablet Take 0.5 tablets (12.5 mg total) by mouth 2 (two) times daily., Starting Fri 08/23/2015, Normal    PRADAXA 150 MG CAPS capsule TAKE ONE CAPSULE BY MOUTH EVERY 12 HOURS, Normal    tamsulosin (FLOMAX) 0.4 MG CAPS capsule TAKE ONE CAPSULE BY MOUTH ONCE DAILY, Normal    timolol (BETIMOL) 0.5 % ophthalmic solution Apply 1 drop to eye daily. , Historical Med    travoprost, benzalkonium, (TRAVATAN) 0.004 %  ophthalmic solution Place 1 drop into both eyes at bedtime. , Historical Med    vitamin C (ASCORBIC ACID) 500 MG tablet Take 1 tablet by mouth daily., Historical Med    acyclovir (ZOVIRAX) 400 MG tablet Take 800 mg by mouth 2 (two) times daily. , Historical Med    aspirin 81 MG tablet Take 81 mg by mouth daily. Reported on 11/01/2015, Historical Med    glucose blood test strip Check blood sugar twice daily as instructed. Pt has Contour Next EZ. Dx E11.319, Normal    MICROLET LANCETS MISC USE ONE LANCET TWICE DAILY  Dx code:  E11.9, Normal    NOVOFINE 30G X 8 MM MISC USE ONE NEEDLE EACH TIME INSULIN IS INJECTED INTO THE SKIN, Normal      STOP taking these medications     Difluprednate (DUREZOL) 0.05 % EMUL          DISCHARGE INSTRUCTIONS:    The patient is to follow-up with primary care physician within one week after discharge  If you experience worsening of your admission symptoms, develop shortness of breath, life threatening emergency, suicidal or homicidal thoughts you must seek medical attention immediately by calling 911 or calling your MD immediately  if symptoms less severe.  You Must read complete instructions/literature along with all the possible adverse reactions/side effects for all the Medicines you take and that have  been prescribed to you. Take any new Medicines after you have completely understood and accept all the possible adverse reactions/side effects.   Please note  You were cared for by a hospitalist during your hospital stay. If you have any questions about your discharge medications or the care you received while you were in the hospital after you are discharged, you can call the unit and asked to speak with the hospitalist on call if the hospitalist that took care of you is not available. Once you are discharged, your primary care physician will handle any further medical issues. Please note that NO REFILLS for any discharge medications will be authorized  once you are discharged, as it is imperative that you return to your primary care physician (or establish a relationship with a primary care physician if you do not have one) for your aftercare needs so that they can reassess your need for medications and monitor your lab values.    Today   CHIEF COMPLAINT:   Chief Complaint  Patient presents with  . Hypoglycemia  . Emesis    HISTORY OF PRESENT ILLNESS:  Chad Bailey  is a 83 y.o. male with a known history of Giant cell arteritis, blindnessand diabetes mellitus presents emergency department after an episode of shaking followed by unresponsiveness. Apparently hgis prednisone dose was recently reduced and he was noted to have low blood glucose levels and shakiness, EMS administered dextrose and his condition improved. His chest x-ray was normal, as well as urinalysis. Blood cultures were taken and they were negative. CT of the head showed old right frontal lobe infarct, no acute changes. Patient's labs revealed elevated troponin, echocardiogram was performed during this admission, revealed normal ejection fraction, mild mitral regurgitation, mildly elevated pulmonary arterial pressure. Patient's oxygenation was relatively stable at 92-94% on room air. Blood pressure as well as heart rate were normal. It was felt that patient's presentation was consistent with hypoglycemia episode. Patient's insulin Lantus was decreased to 10 units due to decreasing doses of prednisone, patient and family were educated about a relationship between prednisone and diabetes and her voice understanding. Discussion by problem: 1. AMS, likely hypoglycemia related, no obvious infection, blood cx negative so far, resolved 2. Hypotension, resolved on IV fluids, continue metoprolol at home doses 3. Shakiness, suspected related to hypoglycemia, blood cultures were negative, no other symptoms, including no fevers were reported while in the hospital 4. Elevated troponin,  likely demand ischemia, unremarkable echo, except of mild mitral regurgitation, mildly elevated pulmonary arterial pressures 5. Hypomagnesemia, supplemented intravenously 6. Generalized weakness, home health services are going to be arranged upon discharge, patient and family were advised to return back to the hospital or follow up with Dr. Audelia Acton if any symptoms arise or condition changes 7. Leukocytosis, etiology remains unclear, patient and family were advised to return back to the hospital or primary care physician if any changes in condition, no antibiotics were prescribed upon discharge    VITAL SIGNS:  Blood pressure (!) 143/55, pulse 81, temperature 98.8 F (37.1 C), temperature source Oral, resp. rate 18, height 6' (1.829 m), weight 78.4 kg (172 lb 14.4 oz), SpO2 92 %.  I/O:   Intake/Output Summary (Last 24 hours) at 10/07/16 1736 Last data filed at 10/07/16 1345  Gross per 24 hour  Intake             1285 ml  Output              700 ml  Net  585 ml    PHYSICAL EXAMINATION:  GENERAL:  83 y.o.-year-old patient lying in the bed with no acute distress.  EYES: Pupils equal, round, reactive to light and accommodation. No scleral icterus. Extraocular muscles intact.  HEENT: Head atraumatic, normocephalic. Oropharynx and nasopharynx clear.  NECK:  Supple, no jugular venous distention. No thyroid enlargement, no tenderness.  LUNGS: Normal breath sounds bilaterally, no wheezing, rales,rhonchi or crepitation. No use of accessory muscles of respiration.  CARDIOVASCULAR: S1, S2 normal. No murmurs, rubs, or gallops.  ABDOMEN: Soft, non-tender, non-distended. Bowel sounds present. No organomegaly or mass.  EXTREMITIES: No pedal edema, cyanosis, or clubbing.  NEUROLOGIC: Cranial nerves II through XII are intact. Muscle strength 5/5 in all extremities. Sensation intact. Gait not checked.  PSYCHIATRIC: The patient is alert and oriented x 3.  SKIN: No obvious rash, lesion, or ulcer.    DATA REVIEW:   CBC  Recent Labs Lab 10/07/16 0419  WBC 18.4*  HGB 11.8*  HCT 35.7*  PLT 194    Chemistries   Recent Labs Lab 10/06/16 0409  NA 136  K 3.8  CL 102  CO2 26  GLUCOSE 170*  BUN 20  CREATININE 0.95  CALCIUM 9.4  MG 1.2*  AST 29  ALT 16*  ALKPHOS 47  BILITOT 1.0    Cardiac Enzymes  Recent Labs Lab 10/06/16 1945  TROPONINI 0.05*    Microbiology Results  Results for orders placed or performed during the hospital encounter of 10/06/16  Blood culture (routine x 2)     Status: None (Preliminary result)   Collection Time: 10/06/16  6:21 AM  Result Value Ref Range Status   Specimen Description BLOOD LEFT WRIST  Final   Special Requests   Final    BOTTLES DRAWN AEROBIC AND ANAEROBIC AER ANA   Culture NO GROWTH 1 DAY  Final   Report Status PENDING  Incomplete  Blood culture (routine x 2)     Status: None (Preliminary result)   Collection Time: 10/06/16  6:22 AM  Result Value Ref Range Status   Specimen Description BLOOD R FA  Final   Special Requests   Final    BOTTLES DRAWN AEROBIC AND ANAEROBIC AER ANA   Culture NO GROWTH 1 DAY  Final   Report Status PENDING  Incomplete    RADIOLOGY:  Ct Head Wo Contrast  Result Date: 10/06/2016 CLINICAL DATA:  Disoriented. Vomiting and altered mental status. History of diabetes, hyperlipidemia, hypertension. EXAM: CT HEAD WITHOUT CONTRAST TECHNIQUE: Contiguous axial images were obtained from the base of the skull through the vertex without intravenous contrast. COMPARISON:  CT HEAD August 06, 2015 FINDINGS: BRAIN: The ventricles and sulci are normal for age. No intraparenchymal hemorrhage, mass effect nor midline shift. Small area RIGHT frontal lobe encephalomalacia. Patchy supratentorial white matter hypodensities within normal range for patient's age, though non-specific are most compatible with chronic small vessel ischemic disease. No acute large vascular territory infarcts. No abnormal  extra-axial fluid collections. Basal cisterns are patent. VASCULAR: Severe calcific atherosclerosis of the carotid siphons. SKULL: No skull fracture. No significant scalp soft tissue swelling. SINUSES/ORBITS: Moderate lobulated paranasal sinus mucosal thickening. Mastoid air cells are well aerated. Status post RIGHT ocular lens implant. The included ocular globes and orbital contents are non-suspicious. OTHER: None. IMPRESSION: No acute intracranial process. Stable chronic changes including RIGHT frontal lobe infarct. Electronically Signed   By: Awilda Metro M.D.   On: 10/06/2016 05:12   Dg Chest Portable 1 View  Result  Date: 10/06/2016 CLINICAL DATA:  Hypoglycemia tonight. EXAM: PORTABLE CHEST 1 VIEW COMPARISON:  08/06/2015 FINDINGS: A single AP portable view of the chest demonstrates no focal airspace consolidation or alveolar edema. The lungs are grossly clear. There is no large effusion or pneumothorax. Cardiac and mediastinal contours appear unremarkable. IMPRESSION: No active disease. Electronically Signed   By: Ellery Plunkaniel R Mitchell M.D.   On: 10/06/2016 04:59    EKG:   Orders placed or performed during the hospital encounter of 10/06/16  . ED EKG  . ED EKG  . EKG 12-Lead  . EKG 12-Lead      Management plans discussed with the patient, family and they are in agreement.  CODE STATUS:     Code Status Orders        Start     Ordered   10/06/16 0758  Full code  Continuous     10/06/16 0757    Code Status History    Date Active Date Inactive Code Status Order ID Comments User Context   This patient has a current code status but no historical code status.    [facility] Directive Documentation   Flowsheet Row Most Recent Value  Type of Advance Directive  Living will  Pre-existing out of facility DNR order (yellow form or pink MOST form)  No data  "MOST" Form in Place?  No data      TOTAL TIME TAKING CARE OF THIS PATIENT: 40 minutes.    Katharina CaperVAICKUTE,Ilyssa Grennan M.D on 10/07/2016 at 5:36  PM  Between 7am to 6pm - Pager - [phone]  After 6pm go to [email or web address] - password [email or web address] [facility]  [facility]  Office  [phone]  CC: Primary care physician; Sherlene ShamsULLO, TERESA L, MD

## 2016-10-07 NOTE — Care Management (Signed)
Patient admitted with AMS.  Patient is blind. Lives at home with wife, and son.  PCP Theis.  Patient has Cane, glucometer, RW, transport chair, and WC in the home. PT has assessed patient and recommened home health PT.  Patient and wife were offered home health agency choice.  [facility].  Home health orders for PT and RN.  Barbara CowerJason with [facility] notified of referral.  RNCM signing off.

## 2016-10-09 ENCOUNTER — Telehealth: Payer: Self-pay | Admitting: Internal Medicine

## 2016-10-09 NOTE — Telephone Encounter (Signed)
Patient is seeing new provider closer to home. Not able to tcm.

## 2016-10-11 LAB — CULTURE, BLOOD (ROUTINE X 2)
Culture: NO GROWTH
Culture: NO GROWTH

## 2016-10-12 NOTE — Progress Notes (Signed)
[facility]  Patient Status: Patient care manager spoke with son and wife, both declined SN & PT services for the patient. They felt that patient will receive care that they are not already providing. MD has been notified. [name], CM and [name], CM both made aware.    [name] 10/12/2016, 1:21 PM

## 2016-10-16 LAB — HEMOGLOBIN A1C
Hgb A1c MFr Bld: 6.5 % — ABNORMAL HIGH (ref 4.8–5.6)
Mean Plasma Glucose: 140 mg/dL

## 2016-10-26 ENCOUNTER — Other Ambulatory Visit: Payer: Self-pay | Admitting: Physician Assistant

## 2016-10-27 ENCOUNTER — Other Ambulatory Visit: Payer: Self-pay | Admitting: Physician Assistant

## 2016-10-29 ENCOUNTER — Other Ambulatory Visit: Payer: Self-pay | Admitting: Physician Assistant

## 2016-11-09 ENCOUNTER — Telehealth: Payer: Self-pay | Admitting: Internal Medicine

## 2016-11-09 NOTE — Telephone Encounter (Signed)
I called pt and left a vm to call office to schedule AWV. Thank you! °

## 2016-11-23 ENCOUNTER — Telehealth: Payer: Self-pay | Admitting: Internal Medicine

## 2016-11-23 NOTE — Telephone Encounter (Signed)
I called pt and left a vm to call the office to sch AWV. Thank you! °

## 2017-01-10 ENCOUNTER — Other Ambulatory Visit: Payer: Self-pay | Admitting: Physician Assistant

## 2017-03-05 ENCOUNTER — Encounter: Payer: Self-pay | Admitting: Emergency Medicine

## 2017-03-05 ENCOUNTER — Emergency Department: Payer: Medicare Other

## 2017-03-05 ENCOUNTER — Inpatient Hospital Stay
Admission: EM | Admit: 2017-03-05 | Discharge: 2017-03-06 | DRG: 914 | Disposition: A | Discharge status: Home / Self Care | Payer: Medicare Other | Attending: Internal Medicine | Admitting: Internal Medicine

## 2017-03-05 DIAGNOSIS — R001 Bradycardia, unspecified: Secondary | ICD-10-CM | POA: Diagnosis present

## 2017-03-05 DIAGNOSIS — I480 Paroxysmal atrial fibrillation: Secondary | ICD-10-CM | POA: Diagnosis present

## 2017-03-05 DIAGNOSIS — Z794 Long term (current) use of insulin: Secondary | ICD-10-CM | POA: Diagnosis not present

## 2017-03-05 DIAGNOSIS — E119 Type 2 diabetes mellitus without complications: Secondary | ICD-10-CM | POA: Diagnosis present

## 2017-03-05 DIAGNOSIS — Z66 Do not resuscitate: Secondary | ICD-10-CM | POA: Diagnosis present

## 2017-03-05 DIAGNOSIS — Z79899 Other long term (current) drug therapy: Secondary | ICD-10-CM

## 2017-03-05 DIAGNOSIS — W010XXA Fall on same level from slipping, tripping and stumbling without subsequent striking against object, initial encounter: Secondary | ICD-10-CM | POA: Diagnosis present

## 2017-03-05 DIAGNOSIS — Z7952 Long term (current) use of systemic steroids: Secondary | ICD-10-CM

## 2017-03-05 DIAGNOSIS — N400 Enlarged prostate without lower urinary tract symptoms: Secondary | ICD-10-CM | POA: Diagnosis present

## 2017-03-05 DIAGNOSIS — R55 Syncope and collapse: Secondary | ICD-10-CM | POA: Diagnosis present

## 2017-03-05 DIAGNOSIS — S0990XA Unspecified injury of head, initial encounter: Secondary | ICD-10-CM

## 2017-03-05 DIAGNOSIS — I509 Heart failure, unspecified: Secondary | ICD-10-CM | POA: Diagnosis present

## 2017-03-05 DIAGNOSIS — H547 Unspecified visual loss: Secondary | ICD-10-CM | POA: Diagnosis not present

## 2017-03-05 DIAGNOSIS — W19XXXA Unspecified fall, initial encounter: Secondary | ICD-10-CM

## 2017-03-05 DIAGNOSIS — I110 Hypertensive heart disease with heart failure: Secondary | ICD-10-CM | POA: Diagnosis present

## 2017-03-05 DIAGNOSIS — Z87891 Personal history of nicotine dependence: Secondary | ICD-10-CM | POA: Diagnosis not present

## 2017-03-05 LAB — GLUCOSE, CAPILLARY
Glucose-Capillary: 212 mg/dL — ABNORMAL HIGH (ref 65–99)
Glucose-Capillary: 253 mg/dL — ABNORMAL HIGH (ref 65–99)

## 2017-03-05 LAB — CBC WITH DIFFERENTIAL/PLATELET
BASOS PCT: 1 %
Basophils Absolute: 0 10*3/uL (ref 0–0.1)
EOS ABS: 0.1 10*3/uL (ref 0–0.7)
Eosinophils Relative: 2 %
HCT: 41.7 % (ref 40.0–52.0)
HEMOGLOBIN: 14 g/dL (ref 13.0–18.0)
Lymphocytes Relative: 17 %
Lymphs Abs: 1.6 10*3/uL (ref 1.0–3.6)
MCH: 30.2 pg (ref 26.0–34.0)
MCHC: 33.5 g/dL (ref 32.0–36.0)
MCV: 90.1 fL (ref 80.0–100.0)
MONO ABS: 0.9 10*3/uL (ref 0.2–1.0)
MONOS PCT: 10 %
Neutro Abs: 6.3 10*3/uL (ref 1.4–6.5)
Neutrophils Relative %: 70 %
Platelets: 252 10*3/uL (ref 150–440)
RBC: 4.62 MIL/uL (ref 4.40–5.90)
RDW: 13.9 % (ref 11.5–14.5)
WBC: 9 10*3/uL (ref 3.8–10.6)

## 2017-03-05 LAB — COMPREHENSIVE METABOLIC PANEL
ALBUMIN: 3.7 g/dL (ref 3.5–5.0)
ALT: 22 U/L (ref 17–63)
AST: 25 U/L (ref 15–41)
Alkaline Phosphatase: 56 U/L (ref 38–126)
Anion gap: 6 (ref 5–15)
BUN: 18 mg/dL (ref 6–20)
CHLORIDE: 105 mmol/L (ref 101–111)
CO2: 27 mmol/L (ref 22–32)
Calcium: 9.3 mg/dL (ref 8.9–10.3)
Creatinine, Ser: 0.79 mg/dL (ref 0.61–1.24)
GFR calc Af Amer: >60 mL/min (ref >60)
GLUCOSE: 186 mg/dL — AB (ref 65–99)
Potassium: 4 mmol/L (ref 3.5–5.1)
SODIUM: 138 mmol/L (ref 135–145)
Total Bilirubin: 1 mg/dL (ref 0.3–1.2)
Total Protein: 6.4 g/dL — ABNORMAL LOW (ref 6.5–8.1)

## 2017-03-05 LAB — URINALYSIS, COMPLETE (UACMP) WITH MICROSCOPIC
BACTERIA UA: NONE SEEN
BILIRUBIN URINE: NEGATIVE
Glucose, UA: NEGATIVE mg/dL
Hgb urine dipstick: NEGATIVE
KETONES UR: NEGATIVE mg/dL
Leukocytes, UA: NEGATIVE
Nitrite: NEGATIVE
PH: 5 (ref 5.0–8.0)
PROTEIN: NEGATIVE mg/dL
Specific Gravity, Urine: 1.013 (ref 1.005–1.030)

## 2017-03-05 LAB — ETHANOL

## 2017-03-05 LAB — TROPONIN I
Troponin I: <0.03 ng/mL (ref <0.03)
Troponin I: <0.03 ng/mL (ref <0.03)

## 2017-03-05 LAB — PROTIME-INR
INR: 1.43
Prothrombin Time: 17.6 seconds — ABNORMAL HIGH (ref 11.4–15.2)

## 2017-03-05 MED ORDER — INSULIN ASPART 100 UNIT/ML ~~LOC~~ SOLN
0.0000 [IU] | Freq: Every day | SUBCUTANEOUS | Status: DC
  Administered 2017-03-05: 3 [IU] via SUBCUTANEOUS
  Filled 2017-03-05: qty 3

## 2017-03-05 MED ORDER — SODIUM CHLORIDE 0.9 % IV BOLUS (SEPSIS)
500.0000 mL | Freq: Once | INTRAVENOUS | Status: AC
Start: 2017-03-05 — End: 2017-03-05
  Administered 2017-03-05: 500 mL via INTRAVENOUS

## 2017-03-05 MED ORDER — CYANOCOBALAMIN 1000 MCG/ML IJ SOLN: 1000.0000 ug | INTRAMUSCULAR | Status: DC

## 2017-03-05 MED ORDER — TRAVOPROST 0.004 % OP SOLN: 1.0000 [drp] | Freq: Every day | OPHTHALMIC | Status: DC

## 2017-03-05 MED ORDER — TAMSULOSIN HCL 0.4 MG PO CAPS
0.4000 mg | ORAL_CAPSULE | Freq: Every day | ORAL | Status: DC
  Administered 2017-03-06: 0.4 mg via ORAL
  Filled 2017-03-05: qty 1

## 2017-03-05 MED ORDER — HYDRALAZINE HCL 20 MG/ML IJ SOLN: 10.0000 mg | Freq: Four times a day (QID) | INTRAMUSCULAR | Status: DC | PRN

## 2017-03-05 MED ORDER — SENNOSIDES-DOCUSATE SODIUM 8.6-50 MG PO TABS: 1.0000 | ORAL_TABLET | Freq: Every evening | ORAL | Status: DC | PRN

## 2017-03-05 MED ORDER — TIMOLOL MALEATE 0.5 % OP SOLN
1.0000 [drp] | Freq: Two times a day (BID) | OPHTHALMIC | Status: DC
  Administered 2017-03-05 – 2017-03-06 (×3): 1 [drp] via OPHTHALMIC
  Filled 2017-03-05: qty 5

## 2017-03-05 MED ORDER — ADULT MULTIVITAMIN W/MINERALS CH
1.0000 | ORAL_TABLET | Freq: Every day | ORAL | Status: DC
  Filled 2017-03-05: qty 1

## 2017-03-05 MED ORDER — DABIGATRAN ETEXILATE MESYLATE 150 MG PO CAPS
150.0000 mg | ORAL_CAPSULE | Freq: Two times a day (BID) | ORAL | Status: DC
  Administered 2017-03-05 – 2017-03-06 (×2): 150 mg via ORAL
  Filled 2017-03-05 (×2): qty 1

## 2017-03-05 MED ORDER — ATORVASTATIN CALCIUM 10 MG PO TABS
10.0000 mg | ORAL_TABLET | Freq: Every day | ORAL | Status: DC
  Administered 2017-03-06: 10 mg via ORAL
  Filled 2017-03-05: qty 1

## 2017-03-05 MED ORDER — FERROUS SULFATE 325 (65 FE) MG PO TABS
325.0000 mg | ORAL_TABLET | Freq: Two times a day (BID) | ORAL | Status: DC
  Administered 2017-03-05 – 2017-03-06 (×2): 325 mg via ORAL
  Filled 2017-03-05 (×2): qty 1

## 2017-03-05 MED ORDER — CALCIUM CARBONATE-VITAMIN D 500-200 MG-UNIT PO TABS
1.0000 | ORAL_TABLET | Freq: Two times a day (BID) | ORAL | Status: DC
  Administered 2017-03-05 – 2017-03-06 (×2): 1 via ORAL
  Filled 2017-03-05 (×2): qty 1

## 2017-03-05 MED ORDER — ACETAMINOPHEN 325 MG PO TABS
650.0000 mg | ORAL_TABLET | Freq: Four times a day (QID) | ORAL | Status: DC | PRN
  Administered 2017-03-06: 650 mg via ORAL
  Filled 2017-03-05: qty 2

## 2017-03-05 MED ORDER — FINASTERIDE 5 MG PO TABS
5.0000 mg | ORAL_TABLET | Freq: Every day | ORAL | Status: DC
  Administered 2017-03-06: 5 mg via ORAL
  Filled 2017-03-05: qty 1

## 2017-03-05 MED ORDER — VITAMIN C 500 MG PO TABS
500.0000 mg | ORAL_TABLET | Freq: Every day | ORAL | Status: DC
  Administered 2017-03-06: 500 mg via ORAL
  Filled 2017-03-05: qty 1

## 2017-03-05 MED ORDER — ONDANSETRON HCL 4 MG PO TABS: 4.0000 mg | ORAL_TABLET | Freq: Four times a day (QID) | ORAL | Status: DC | PRN

## 2017-03-05 MED ORDER — PREDNISONE 1 MG PO TABS
1.0000 mg | ORAL_TABLET | Freq: Every day | ORAL | Status: DC
Start: 2017-03-06 — End: 2017-03-06
  Administered 2017-03-06: 1 mg via ORAL
  Filled 2017-03-05: qty 1

## 2017-03-05 MED ORDER — ACETAMINOPHEN 650 MG RE SUPP: 650.0000 mg | Freq: Four times a day (QID) | RECTAL | Status: DC | PRN

## 2017-03-05 MED ORDER — INSULIN ASPART 100 UNIT/ML ~~LOC~~ SOLN
0.0000 [IU] | Freq: Three times a day (TID) | SUBCUTANEOUS | Status: DC
  Administered 2017-03-05: 3 [IU] via SUBCUTANEOUS
  Administered 2017-03-06: 2 [IU] via SUBCUTANEOUS
  Administered 2017-03-06: 3 [IU] via SUBCUTANEOUS
  Filled 2017-03-05: qty 2
  Filled 2017-03-05 (×2): qty 3

## 2017-03-05 MED ORDER — ONDANSETRON HCL 4 MG/2ML IJ SOLN: 4.0000 mg | Freq: Four times a day (QID) | INTRAMUSCULAR | Status: DC | PRN

## 2017-03-05 MED ORDER — IBUPROFEN 400 MG PO TABS: 400.0000 mg | ORAL_TABLET | Freq: Four times a day (QID) | ORAL | Status: DC | PRN

## 2017-03-05 MED ORDER — LATANOPROST 0.005 % OP SOLN
1.0000 [drp] | Freq: Every day | OPHTHALMIC | Status: DC
  Administered 2017-03-05: 1 [drp] via OPHTHALMIC
  Filled 2017-03-05: qty 2.5

## 2017-03-05 MED ORDER — INSULIN GLARGINE 100 UNIT/ML ~~LOC~~ SOLN
13.0000 [IU] | Freq: Every day | SUBCUTANEOUS | Status: DC
  Administered 2017-03-05: 13 [IU] via SUBCUTANEOUS
  Filled 2017-03-05 (×2): qty 0.13

## 2017-03-05 NOTE — Progress Notes (Signed)
Family Meeting Note  [facility] Directive:yes  Today a meeting took place with the Patient.spouse   The following clinical team members were present during this meeting:MD  The following were discussed:Patient's diagnosis:syncope Fall bradycardia symptomatic   , Patient's progosis: Unable to determine and Goals for treatment: DNR  Additional follow-up to be provided: none at this time  Time spent during discussion:16 minutes  Aaylah Pokorny, Patricia PesaSITAL, MD

## 2017-03-05 NOTE — Consult Note (Signed)
[facility] Cardiology Consultation Note  Patient ID: Chad Bailey, MRN: [id number], DOB/AGE: July 11, 1930 83 y.o. Admit date: 03/05/2017   Date of Consult: 03/05/2017 Primary Physician: Sherlene Shams, MD Primary Cardiologist: [facility]  Chief Complaint:  Chief Complaint  Patient presents with  . Fall   Reason for Consult: syncope  HPI: 83 y.o. male with known diabeteswith complication and chronic nonvalvular atrial fibrillation with appropriate medication management including beta blocker for heart rate control and anticoagulation. The patient has had worsening shortness of breath weakness fatigue and had an episode of syncope for which she fell and hurt himself. After that time the patient had improvements and was seen in the emergency room. He did not have any chest pain or myocardial infarction with a normal troponin. He did have an EKG of atrial fibrillation with slow ventricular rate likely the cause of his syncopal episode.He has had since discontinuation of his metoprolol and may be improving at this time. Otherwise he is hemodynamically stable without heart failure symptoms  Past Medical History:  Diagnosis Date  . Blindness   . Dysuria   . Fracture of left ankle   . Giant cell arteritis (HCC)   . gouty arthritis   . History of tobacco abuse   . Hyperlipidemia   . Hypertension   . Loss of vision 01/2013   left eye  . Non-insulin dependent diabetes mellitus   . PAF (paroxysmal atrial fibrillation) (HCC)    a. on pradaxa; b. CHADS2VASc at least 7 (HTN, age x 2, DM, stroke, vascular disease)  . Retinal artery thrombosis, left 2014  . Thyroid nodule 2014  . Type 2 diabetes mellitus [facility])       Surgical History:  Past Surgical History:  Procedure Laterality Date  . EYE SURGERY     right  . LASIK     bilateral eye surgery  . left ankle   1996   s/p surgery with screws     Home Meds: Prior to Admission medications   Medication Sig Start Date End Date Taking?  Authorizing Provider  atorvastatin (LIPITOR) 10 MG tablet Take 10 mg by mouth daily.  09/30/16  Yes [provider]  Brinzolamide-Brimonidine (SIMBRINZA) 1-0.2 % SUSP Apply to eye 2 (two) times daily.   Yes [provider]  Calcium Carbonate-Vitamin D 600-400 MG-UNIT tablet Take 1 tablet by mouth 2 (two) times daily.  08/30/15 03/05/18 Yes [provider]  cyanocobalamin (,VITAMIN B-12,) 1000 MCG/ML injection Inject 1,000 mcg into the muscle every 30 (thirty) days. 09/25/16 09/20/17 Yes [provider]  ferrous sulfate 325 (65 FE) MG tablet Take 1 tablet by mouth 2 (two) times daily.   Yes [provider]  finasteride (PROSCAR) 5 MG tablet TAKE ONE TABLET BY MOUTH ONCE DAILY 05/29/15  Yes Sherlene Shams, MD  glucose blood test strip Check blood sugar twice daily as instructed. Pt has Contour Next EZ. Dx W09.811 08/16/14  Yes Sherlene Shams, MD  HUMALOG 100 UNIT/ML injection Inject 7 Units into the skin 3 (three) times daily with meals.  08/30/15  Yes [provider]  insulin glargine (LANTUS) 100 UNIT/ML injection Inject 0.1 mLs (10 Units total) into the skin at bedtime. Please taper off insulin Lantus by 2 units if you are tapering off prednisone, following your blood glucose levels,  Thank you Patient taking differently: Inject 13 Units into the skin at bedtime. Please taper off insulin Lantus by 2 units if you are tapering off prednisone, following your  blood glucose levels,  Thank you 10/07/16  Yes Katharina Caper, MD  metoprolol tartrate (LOPRESSOR) 25 MG tablet Take 0.5 tablets (12.5 mg total) by mouth 2 (two) times daily. 08/23/15  Yes Dunn, Raymon Mutton, PA-C  MICROLET LANCETS MISC USE ONE LANCET TWICE DAILY  Dx code:  E11.9 05/30/15  Yes Sherlene Shams, MD  Multiple Vitamin (MULTIVITAMIN) capsule Take 1 capsule by mouth daily.   Yes [provider]  NOVOFINE 30G X 8 MM MISC USE ONE NEEDLE EACH TIME INSULIN IS INJECTED INTO THE SKIN 05/29/15   Yes Sherlene Shams, MD  PRADAXA 150 MG CAPS capsule TAKE ONE CAPSULE BY MOUTH EVERY 12 HOURS 05/28/15  Yes Gollan, Tollie Pizza, MD  predniSONE (DELTASONE) 1 MG tablet Take 3 tablets (3 mg total) by mouth daily with breakfast. Patient taking differently: Take 1 mg by mouth daily with breakfast.  10/08/16  Yes Katharina Caper, MD  tamsulosin (FLOMAX) 0.4 MG CAPS capsule TAKE ONE CAPSULE BY MOUTH ONCE DAILY 08/21/15  Yes Sherlene Shams, MD  timolol (BETIMOL) 0.5 % ophthalmic solution Place 1 drop into both eyes 2 (two) times daily.    Yes [provider]  travoprost, benzalkonium, (TRAVATAN) 0.004 % ophthalmic solution Place 1 drop into both eyes at bedtime.    Yes [provider]  vitamin C (ASCORBIC ACID) 500 MG tablet Take 1 tablet by mouth daily.   Yes [provider]  acetaminophen (TYLENOL) 500 MG tablet Take 500 mg by mouth every 6 (six) hours as needed. Reported on 11/01/2015    [provider]  metFORMIN (GLUCOPHAGE) 1000 MG tablet TAKE ONE TABLET BY MOUTH TWICE DAILY WITH MEALS Patient not taking: Reported on 03/05/2017 05/29/15   Sherlene Shams, MD    Inpatient Medications:  . [START ON 03/06/2017] atorvastatin  10 mg Oral Daily  . calcium-vitamin D  1 tablet Oral BID  . [START ON 03/31/2017] cyanocobalamin  1,000 mcg Intramuscular Q30 days  . dabigatran  150 mg Oral Q12H  . ferrous sulfate  325 mg Oral BID  . [START ON 03/06/2017] finasteride  5 mg Oral Daily  . insulin aspart  0-5 Units Subcutaneous QHS  . insulin aspart  0-9 Units Subcutaneous TID WC  . insulin glargine  13 Units Subcutaneous QHS  . latanoprost  1 drop Both Eyes QHS  . [START ON 03/06/2017] multivitamin with minerals  1 tablet Oral Daily  . [START ON 03/06/2017] predniSONE  1 mg Oral Q breakfast  . [START ON 03/06/2017] tamsulosin  0.4 mg Oral Daily  . timolol  1 drop Both Eyes BID  . [START ON 03/06/2017] vitamin C  500 mg Oral Daily     Allergies: No Known Allergies  Social History    Social History  . Marital status: Married    Spouse name: N/A  . Number of children: N/A  . Years of education: N/A   Occupational History  . Not on file.   Social History Main Topics  . Smoking status: Former Smoker    Packs/day: 6.00    Years: 56.00    Types: Cigarettes, Pipe    Quit date: 10/05/2002  . Smokeless tobacco: Former Neurosurgeon    Types: Chew     Comment: smoked 6 pipes/day;smoked cigarettes 1/2 PPD ,quit chewing tobacco in 2004  . Alcohol use No  . Drug use: No  . Sexual activity: Not on file   Other Topics Concern  . Not on file   Social History Narrative  .  No narrative on file     Family History  Problem Relation Age of Onset  . Heart disease Mother   . Tuberculosis Mother   . Lung cancer Father   . Cancer Maternal Grandmother        bowel  . Kidney disease Neg Hx   . Prostate cancer Neg Hx      Review of Systems Positive for syncope Negative for: General:  chills, fever, night sweats or weight changes.  Cardiovascular: PND orthopnea positive forsyncope dizziness  Dermatological skin lesions rashes Respiratory: Cough congestion Urologic: Frequent urination urination at night and hematuria Abdominal: negative for nausea, vomiting, diarrhea, bright red blood per rectum, melena, or hematemesis Neurologic: negative for visual changes, and/or hearing changes  All other systems reviewed and are otherwise negative except as noted above.  Labs:  Recent Labs  03/05/17 0933 03/05/17 1438  TROPONINI <0.03 <0.03   Lab Results  Component Value Date   WBC 9.0 03/05/2017   HGB 14.0 03/05/2017   HCT 41.7 03/05/2017   MCV 90.1 03/05/2017   PLT 252 03/05/2017    Recent Labs Lab 03/05/17 0933  NA 138  K 4.0  CL 105  CO2 27  BUN 18  CREATININE 0.79  CALCIUM 9.3  PROT 6.4*  BILITOT 1.0  ALKPHOS 56  ALT 22  AST 25  GLUCOSE 186*   Lab Results  Component Value Date   CHOL 117 08/01/2014   HDL 49.20 08/01/2014   LDLCALC 57 08/01/2014    TRIG 53.0 08/01/2014   No results found for: DDIMER  Radiology/Studies:  Dg Ankle Complete Left  Result Date: 03/05/2017 CLINICAL DATA:  Mechanical fall.  Lateral left ankle pain. EXAM: LEFT ANKLE COMPLETE - 3+ VIEW COMPARISON:  Ankle radiographs 09/24/2014. FINDINGS: Soft tissue swelling is present over the lateral malleolus. The is located. There is no underlying fracture. Remote ORIF of the medial malleoli this is again noted. Lucency about the distal ends of screws is similar the prior study. Vascular calcifications are present. Calcifications along the plantar fascia are again noted. IMPRESSION: 1. Soft tissue swelling over the lateral malleolus without underlying fracture. 2. Stable appearance of remote medial malleolus ORIF. 3. Vascular calcifications compatible with known type 2 diabetes. 4. Plantar calcaneal spur and calcification along the plantar fascia likely related to plantar fasciitis. Electronically Signed   By: Marin Roberts M.D.   On: 03/05/2017 10:42   Ct Head Wo Contrast  Result Date: 03/05/2017 CLINICAL DATA:  83 year old fell last night. Possible loss of consciousness for 2 minutes. Confusion. EXAM: CT HEAD WITHOUT CONTRAST CT CERVICAL SPINE WITHOUT CONTRAST TECHNIQUE: Multidetector CT imaging of the head and cervical spine was performed following the standard protocol without intravenous contrast. Multiplanar CT image reconstructions of the cervical spine were also generated. COMPARISON:  CT head 10/06/2016 and 08/06/2015. Thoracic spine radiographs 08/22/2007. FINDINGS: CT HEAD FINDINGS Brain: There is no evidence of acute intracranial hemorrhage, mass lesion, brain edema or extra-axial fluid collection. There is stable mild generalized atrophy. There is no CT evidence of acute cortical infarction. Chronic small vessel ischemic changes in the periventricular white matter again noted with stable asymmetric subcortical encephalomalacia in the right frontal lobe. Vascular:  Intracranial vascular calcifications are noted. Skull: Negative for fracture or focal lesion. Sinuses/Orbits: The visualized paranasal sinuses, mastoid air cells and middle ears are clear. Probable postsurgical changes in both globes with lens implant on the right. Other: None. CT CERVICAL SPINE FINDINGS Alignment: Exaggerated thoracic kyphosis. The cervical alignment is normal.  There is no focal angulation or listhesis. Skull base and vertebrae: There is mild depression of the inferior endplate of C6 posteriorly on the sagittal images. This does not appear acute and appears grossly stable from prior radiographs. No evidence of acute fracture or traumatic subluxation. Soft tissues and spinal canal: No prevertebral fluid or swelling. No visible canal hematoma. Prominent carotid atherosclerosis bilaterally. There is a small amount of air within the lumen of the right jugular vein, likely due to a venous catheter. Disc levels: Mild spondylosis for age with mild discal calcification. No high-grade spinal stenosis or nerve root encroachment. Upper chest: Unremarkable. Other: None. IMPRESSION: 1. No acute intracranial or calvarial findings. 2. Stable right frontal lobe encephalomalacia and chronic small vessel ischemic changes. 3. No evidence of acute cervical spine fracture, traumatic subluxation or static signs of instability. Electronically Signed   By: Carey Bullocks M.D.   On: 03/05/2017 10:28   Ct Cervical Spine Wo Contrast  Result Date: 03/05/2017 CLINICAL DATA:  83 year old fell last night. Possible loss of consciousness for 2 minutes. Confusion. EXAM: CT HEAD WITHOUT CONTRAST CT CERVICAL SPINE WITHOUT CONTRAST TECHNIQUE: Multidetector CT imaging of the head and cervical spine was performed following the standard protocol without intravenous contrast. Multiplanar CT image reconstructions of the cervical spine were also generated. COMPARISON:  CT head 10/06/2016 and 08/06/2015. Thoracic spine radiographs  08/22/2007. FINDINGS: CT HEAD FINDINGS Brain: There is no evidence of acute intracranial hemorrhage, mass lesion, brain edema or extra-axial fluid collection. There is stable mild generalized atrophy. There is no CT evidence of acute cortical infarction. Chronic small vessel ischemic changes in the periventricular white matter again noted with stable asymmetric subcortical encephalomalacia in the right frontal lobe. Vascular: Intracranial vascular calcifications are noted. Skull: Negative for fracture or focal lesion. Sinuses/Orbits: The visualized paranasal sinuses, mastoid air cells and middle ears are clear. Probable postsurgical changes in both globes with lens implant on the right. Other: None. CT CERVICAL SPINE FINDINGS Alignment: Exaggerated thoracic kyphosis. The cervical alignment is normal. There is no focal angulation or listhesis. Skull base and vertebrae: There is mild depression of the inferior endplate of C6 posteriorly on the sagittal images. This does not appear acute and appears grossly stable from prior radiographs. No evidence of acute fracture or traumatic subluxation. Soft tissues and spinal canal: No prevertebral fluid or swelling. No visible canal hematoma. Prominent carotid atherosclerosis bilaterally. There is a small amount of air within the lumen of the right jugular vein, likely due to a venous catheter. Disc levels: Mild spondylosis for age with mild discal calcification. No high-grade spinal stenosis or nerve root encroachment. Upper chest: Unremarkable. Other: None. IMPRESSION: 1. No acute intracranial or calvarial findings. 2. Stable right frontal lobe encephalomalacia and chronic small vessel ischemic changes. 3. No evidence of acute cervical spine fracture, traumatic subluxation or static signs of instability. Electronically Signed   By: Carey Bullocks M.D.   On: 03/05/2017 10:28   Dg Chest Port 1 View  Result Date: 03/05/2017 CLINICAL DATA:  Fall.  Pain left rib area. EXAM:  PORTABLE CHEST 1 VIEW COMPARISON:  Chest x-ray 10/06/2016 .  T-spine 08/22/2007 . FINDINGS: Mediastinum hilar structures are normal. Low lung volumes with mild basilar atelectasis and/or scarring. No pleural effusion or pneumothorax. Heart size normal. Degenerative changes thoracic spine. No evidence of displaced rib fracture. IMPRESSION: No acute abnormality identified. No evidence of displaced rib fracture or pneumothorax. Electronically Signed   By: Maisie Fus  Register   On: 03/05/2017 10:43  Dg Hip Unilat W Or Wo Pelvis 2-3 Views Right  Result Date: 03/05/2017 CLINICAL DATA:  Pain following fall EXAM: DG HIP (WITH OR WITHOUT PELVIS) 2-3V RIGHT COMPARISON:  None. FINDINGS: Frontal pelvis as well as frontal and lateral right hip images were obtained. No fracture or dislocation. There is mild symmetric narrowing of both hip joints. No erosive change. There is extensive iliac and femoral artery calcification bilaterally. IMPRESSION: Mild symmetric narrowing both hip joints. No fracture or dislocation. Extensive arterial vascular calcification consistent with atherosclerosis. Electronically Signed   By: Bretta BangWilliam  Woodruff III M.D.   On: 03/05/2017 10:45    EKG: atrial fibrillation with slow ventricular rate and nonspecific ST changes  Weights: Filed Weights   03/05/17 0916 03/05/17 1417  Weight: 75.8 kg (167 lb) 73.4 kg (161 lb 14.4 oz)     Physical Exam: Blood pressure (!) 153/72, pulse 84, temperature 98 F (36.7 C), temperature source Oral, resp. rate 18, height 5\' 8"  (1.727 m), weight 73.4 kg (161 lb 14.4 oz), SpO2 94 %. Body mass index is 24.62 kg/m. General: Well developed, well nourished, in no acute distress. Head eyes ears nose throat: Normocephalic, atraumatic, sclera non-icteric, no xanthomas, nares are without discharge. No apparent thyromegaly and/or mass  Lungs: Normal respiratory effort.  no wheezes, ew basilar rales, no rhonchi.  Heart: irregular with normal S1 S2. no murmur gallop,  no rub, PMI is normal size and placement, carotid upstroke normal without bruit, jugular venous pressure is normal Abdomen: Soft, non-tender, non-distended with normoactive bowel sounds. No hepatomegaly. No rebound/guarding. No obvious abdominal masses. Abdominal aorta is normal size without bruit Extremities: one plusedema. no cyanosis, no clubbing, no ulcers  Peripheral : 2+ bilateral upper extremity pulses, 2+ bilateral femoral pulses, 2+ bilateral dorsal pedal pulse Neuro: Alert and oriented. No facial asymmetry. No focal deficit. Moves all extremities spontaneously. Musculoskeletal: Normal muscle tone without kyphosis Psych:  Responds to questions appropriately with a normal affect.    Assessment: 83 year old male with diabetes and complications essential hypertension mixed hyperlipidemia with chronic nonvalvular atrial fibrillation with slow ventricular rate due to metoprolol causing syncopewithout evidence of myocardial infarction or heart failure  Plan: 1. Discontinuation  Of metoprolol to improve heart rate and reduce possibility of bradycardia likely the cause of his syncopal episode 2.Continue anticoagulation for further risk reduction in stroke with atrial fibrillation 3. Hypertension control with other medication management if necessary 4. High intensity  Cholesterol therapy with atorvastatin 5. Begin ambulation and follow for further significant symptoms and significance of bradycardia  Signed, Lamar BlinksBruce J Moesha Sarchet M.D. [facility] 03/05/2017, 5:04 PM

## 2017-03-05 NOTE — ED Triage Notes (Addendum)
Pt had mechanical fall last night.  He hit right side of head. Redness/bruising present.  C/o left rib pain. Has been confused and more forgetful than normal since fall. Pt on pradaxa. Hypotensive in triage. BP 120/60 at office visit earlier this year. Has been drowsy.  Took bp meds this am

## 2017-03-05 NOTE — H&P (Signed)
[employer] - [place] at [facility]   PATIENT NAME: Chad Bailey    MR#:  [id number]  DATE OF BIRTH:  1930-09-09  DATE OF ADMISSION:  03/05/2017  PRIMARY CARE PHYSICIAN: Sherlene Shams, MD   REQUESTING/REFERRING PHYSICIAN: dr Alphonzo Lemmings  CHIEF COMPLAINT:   fall HISTORY OF PRESENT ILLNESS:  Chad Bailey  is a 83 y.o. male with a known history of Blindness, diabetes and PAF who presents with above complaint. Family reports that he was getting out of his car and stumbled on something and fell on the concrete floor. They brought him to the emergency room because the patient was worried about some swelling on his head. CT scan of the head shows no ICH or hematoma. Patient is denying any chest pain or shortness of breath prior to this event or dizziness. Family reports that when patient fell he was down for 2 minutes. In the emergency room his heart rates are noted to be in the 40s.  PAST MEDICAL HISTORY:   Past Medical History:  Diagnosis Date  . Blindness   . Dysuria   . Fracture of left ankle   . Giant cell arteritis (HCC)   . gouty arthritis   . History of tobacco abuse   . Hyperlipidemia   . Hypertension   . Loss of vision 01/2013   left eye  . Non-insulin dependent diabetes mellitus   . PAF (paroxysmal atrial fibrillation) (HCC)    a. on pradaxa; b. CHADS2VASc at least 7 (HTN, age x 2, DM, stroke, vascular disease)  . Retinal artery thrombosis, left 2014  . Thyroid nodule 2014  . Type 2 diabetes mellitus (HCC)     PAST SURGICAL HISTORY:   Past Surgical History:  Procedure Laterality Date  . EYE SURGERY     right  . LASIK     bilateral eye surgery  . left ankle   1996   s/p surgery with screws    SOCIAL HISTORY:   Social History  Substance Use Topics  . Smoking status: Former Smoker    Packs/day: 6.00    Years: 56.00    Types: Cigarettes, Pipe    Quit date: 10/05/2002  . Smokeless tobacco: Former Neurosurgeon    Types: Chew     Comment: smoked 6  pipes/day;smoked cigarettes 1/2 PPD ,quit chewing tobacco in 2004  . Alcohol use No    FAMILY HISTORY:   Family History  Problem Relation Age of Onset  . Heart disease Mother   . Tuberculosis Mother   . Lung cancer Father   . Cancer Maternal Grandmother        bowel  . Kidney disease Neg Hx   . Prostate cancer Neg Hx     DRUG ALLERGIES:  No Known Allergies  REVIEW OF SYSTEMS:   Review of Systems  Constitutional: Negative.  Negative for chills, fever and malaise/fatigue.  HENT: Negative.  Negative for ear discharge, ear pain, hearing loss, nosebleeds and sore throat.   Eyes: Negative for blurred vision and pain.       Blind  Respiratory: Negative.  Negative for cough, hemoptysis, shortness of breath and wheezing.   Cardiovascular: Negative.  Negative for chest pain, palpitations and leg swelling.  Gastrointestinal: Negative.  Negative for abdominal pain, blood in stool, diarrhea, nausea and vomiting.  Genitourinary: Negative.  Negative for dysuria.  Musculoskeletal: Positive for falls. Negative for back pain.  Skin: Negative.   Neurological: Negative for dizziness, tremors, speech change, focal weakness, seizures and headaches.  Endo/Heme/Allergies: Negative.  Does not bruise/bleed easily.  Psychiatric/Behavioral: Negative.  Negative for depression, hallucinations and suicidal ideas.    MEDICATIONS AT HOME:   Prior to Admission medications   Medication Sig Start Date End Date Taking? Authorizing Provider  atorvastatin (LIPITOR) 10 MG tablet Take 10 mg by mouth daily.  09/30/16  Yes [provider]  Brinzolamide-Brimonidine (SIMBRINZA) 1-0.2 % SUSP Apply to eye 2 (two) times daily.   Yes [provider]  Calcium Carbonate-Vitamin D 600-400 MG-UNIT tablet Take 1 tablet by mouth 2 (two) times daily.  08/30/15 03/05/18 Yes [provider]  cyanocobalamin (,VITAMIN B-12,) 1000 MCG/ML injection Inject 1,000 mcg into the muscle every 30 (thirty) days.  09/25/16 09/20/17 Yes [provider]  ferrous sulfate 325 (65 FE) MG tablet Take 1 tablet by mouth 2 (two) times daily.   Yes [provider]  finasteride (PROSCAR) 5 MG tablet TAKE ONE TABLET BY MOUTH ONCE DAILY 05/29/15  Yes Sherlene Shams, MD  glucose blood test strip Check blood sugar twice daily as instructed. Pt has Contour Next EZ. Dx Z61.096 08/16/14  Yes Sherlene Shams, MD  HUMALOG 100 UNIT/ML injection Inject 7 Units into the skin 3 (three) times daily with meals.  08/30/15  Yes [provider]  insulin glargine (LANTUS) 100 UNIT/ML injection Inject 0.1 mLs (10 Units total) into the skin at bedtime. Please taper off insulin Lantus by 2 units if you are tapering off prednisone, following your blood glucose levels,  Thank you Patient taking differently: Inject 13 Units into the skin at bedtime. Please taper off insulin Lantus by 2 units if you are tapering off prednisone, following your blood glucose levels,  Thank you 10/07/16  Yes Katharina Caper, MD  metoprolol tartrate (LOPRESSOR) 25 MG tablet Take 0.5 tablets (12.5 mg total) by mouth 2 (two) times daily. 08/23/15  Yes Dunn, Raymon Mutton, PA-C  MICROLET LANCETS MISC USE ONE LANCET TWICE DAILY  Dx code:  E11.9 05/30/15  Yes Sherlene Shams, MD  Multiple Vitamin (MULTIVITAMIN) capsule Take 1 capsule by mouth daily.   Yes [provider]  NOVOFINE 30G X 8 MM MISC USE ONE NEEDLE EACH TIME INSULIN IS INJECTED INTO THE SKIN 05/29/15  Yes Sherlene Shams, MD  PRADAXA 150 MG CAPS capsule TAKE ONE CAPSULE BY MOUTH EVERY 12 HOURS 05/28/15  Yes Gollan, Tollie Pizza, MD  predniSONE (DELTASONE) 1 MG tablet Take 3 tablets (3 mg total) by mouth daily with breakfast. Patient taking differently: Take 1 mg by mouth daily with breakfast.  10/08/16  Yes Katharina Caper, MD  tamsulosin (FLOMAX) 0.4 MG CAPS capsule TAKE ONE CAPSULE BY MOUTH ONCE DAILY 08/21/15  Yes Sherlene Shams, MD  timolol (BETIMOL) 0.5 % ophthalmic solution Place 1 drop  into both eyes 2 (two) times daily.    Yes [provider]  travoprost, benzalkonium, (TRAVATAN) 0.004 % ophthalmic solution Place 1 drop into both eyes at bedtime.    Yes [provider]  vitamin C (ASCORBIC ACID) 500 MG tablet Take 1 tablet by mouth daily.   Yes [provider]  acetaminophen (TYLENOL) 500 MG tablet Take 500 mg by mouth every 6 (six) hours as needed. Reported on 11/01/2015    [provider]  metFORMIN (GLUCOPHAGE) 1000 MG tablet TAKE ONE TABLET BY MOUTH TWICE DAILY WITH MEALS Patient not taking: Reported on 03/05/2017 05/29/15   Sherlene Shams, MD      VITAL SIGNS:  Blood pressure (!) 115/55, pulse Marland Kitchen)  30, temperature 98.7 F (37.1 C), temperature source Oral, resp. rate 19, height 5\' 8"  (1.727 m), weight 75.8 kg (167 lb), SpO2 96 %.  PHYSICAL EXAMINATION:   Physical Exam  Constitutional: He is oriented to person, place, and time and well-developed, well-nourished, and in no distress. No distress.  HENT:  Head: Normocephalic.  Eyes: Pupils are equal, round, and reactive to light. No scleral icterus.  Neck: Normal range of motion. Neck supple. No JVD present. No tracheal deviation present.  Cardiovascular: Normal heart sounds.  Exam reveals no gallop and no friction rub.   No murmur heard. Bradycardic irregular, irregular  Pulmonary/Chest: Effort normal and breath sounds normal. No respiratory distress. He has no wheezes. He has no rales. He exhibits no tenderness.  Abdominal: Soft. Bowel sounds are normal. He exhibits no distension and no mass. There is no tenderness. There is no rebound and no guarding.  Musculoskeletal: Normal range of motion. He exhibits no edema.  Neurological: He is alert and oriented to person, place, and time.  Skin: Skin is warm. No rash noted. No erythema.  Psychiatric: Affect and judgment normal.      LABORATORY PANEL:   CBC  Recent Labs Lab 03/05/17 0933  WBC 9.0  HGB 14.0  HCT 41.7  PLT 252    ------------------------------------------------------------------------------------------------------------------  Chemistries   Recent Labs Lab 03/05/17 0933  NA 138  K 4.0  CL 105  CO2 27  GLUCOSE 186*  BUN 18  CREATININE 0.79  CALCIUM 9.3  AST 25  ALT 22  ALKPHOS 56  BILITOT 1.0   ------------------------------------------------------------------------------------------------------------------  Cardiac Enzymes  Recent Labs Lab 03/05/17 0933  TROPONINI <0.03   ------------------------------------------------------------------------------------------------------------------  RADIOLOGY:  Dg Ankle Complete Left  Result Date: 03/05/2017 CLINICAL DATA:  Mechanical fall.  Lateral left ankle pain. EXAM: LEFT ANKLE COMPLETE - 3+ VIEW COMPARISON:  Ankle radiographs 09/24/2014. FINDINGS: Soft tissue swelling is present over the lateral malleolus. The is located. There is no underlying fracture. Remote ORIF of the medial malleoli this is again noted. Lucency about the distal ends of screws is similar the prior study. Vascular calcifications are present. Calcifications along the plantar fascia are again noted. IMPRESSION: 1. Soft tissue swelling over the lateral malleolus without underlying fracture. 2. Stable appearance of remote medial malleolus ORIF. 3. Vascular calcifications compatible with known type 2 diabetes. 4. Plantar calcaneal spur and calcification along the plantar fascia likely related to plantar fasciitis. Electronically Signed   By: Marin Roberts M.D.   On: 03/05/2017 10:42   Ct Head Wo Contrast  Result Date: 03/05/2017 CLINICAL DATA:  83 year old fell last night. Possible loss of consciousness for 2 minutes. Confusion. EXAM: CT HEAD WITHOUT CONTRAST CT CERVICAL SPINE WITHOUT CONTRAST TECHNIQUE: Multidetector CT imaging of the head and cervical spine was performed following the standard protocol without intravenous contrast. Multiplanar CT image reconstructions  of the cervical spine were also generated. COMPARISON:  CT head 10/06/2016 and 08/06/2015. Thoracic spine radiographs 08/22/2007. FINDINGS: CT HEAD FINDINGS Brain: There is no evidence of acute intracranial hemorrhage, mass lesion, brain edema or extra-axial fluid collection. There is stable mild generalized atrophy. There is no CT evidence of acute cortical infarction. Chronic small vessel ischemic changes in the periventricular white matter again noted with stable asymmetric subcortical encephalomalacia in the right frontal lobe. Vascular: Intracranial vascular calcifications are noted. Skull: Negative for fracture or focal lesion. Sinuses/Orbits: The visualized paranasal sinuses, mastoid air cells and middle ears are clear. Probable postsurgical changes in both globes with lens implant  on the right. Other: None. CT CERVICAL SPINE FINDINGS Alignment: Exaggerated thoracic kyphosis. The cervical alignment is normal. There is no focal angulation or listhesis. Skull base and vertebrae: There is mild depression of the inferior endplate of C6 posteriorly on the sagittal images. This does not appear acute and appears grossly stable from prior radiographs. No evidence of acute fracture or traumatic subluxation. Soft tissues and spinal canal: No prevertebral fluid or swelling. No visible canal hematoma. Prominent carotid atherosclerosis bilaterally. There is a small amount of air within the lumen of the right jugular vein, likely due to a venous catheter. Disc levels: Mild spondylosis for age with mild discal calcification. No high-grade spinal stenosis or nerve root encroachment. Upper chest: Unremarkable. Other: None. IMPRESSION: 1. No acute intracranial or calvarial findings. 2. Stable right frontal lobe encephalomalacia and chronic small vessel ischemic changes. 3. No evidence of acute cervical spine fracture, traumatic subluxation or static signs of instability. Electronically Signed   By: Carey Bullocks M.D.   On:  03/05/2017 10:28   Ct Cervical Spine Wo Contrast  Result Date: 03/05/2017 CLINICAL DATA:  83 year old fell last night. Possible loss of consciousness for 2 minutes. Confusion. EXAM: CT HEAD WITHOUT CONTRAST CT CERVICAL SPINE WITHOUT CONTRAST TECHNIQUE: Multidetector CT imaging of the head and cervical spine was performed following the standard protocol without intravenous contrast. Multiplanar CT image reconstructions of the cervical spine were also generated. COMPARISON:  CT head 10/06/2016 and 08/06/2015. Thoracic spine radiographs 08/22/2007. FINDINGS: CT HEAD FINDINGS Brain: There is no evidence of acute intracranial hemorrhage, mass lesion, brain edema or extra-axial fluid collection. There is stable mild generalized atrophy. There is no CT evidence of acute cortical infarction. Chronic small vessel ischemic changes in the periventricular white matter again noted with stable asymmetric subcortical encephalomalacia in the right frontal lobe. Vascular: Intracranial vascular calcifications are noted. Skull: Negative for fracture or focal lesion. Sinuses/Orbits: The visualized paranasal sinuses, mastoid air cells and middle ears are clear. Probable postsurgical changes in both globes with lens implant on the right. Other: None. CT CERVICAL SPINE FINDINGS Alignment: Exaggerated thoracic kyphosis. The cervical alignment is normal. There is no focal angulation or listhesis. Skull base and vertebrae: There is mild depression of the inferior endplate of C6 posteriorly on the sagittal images. This does not appear acute and appears grossly stable from prior radiographs. No evidence of acute fracture or traumatic subluxation. Soft tissues and spinal canal: No prevertebral fluid or swelling. No visible canal hematoma. Prominent carotid atherosclerosis bilaterally. There is a small amount of air within the lumen of the right jugular vein, likely due to a venous catheter. Disc levels: Mild spondylosis for age with mild  discal calcification. No high-grade spinal stenosis or nerve root encroachment. Upper chest: Unremarkable. Other: None. IMPRESSION: 1. No acute intracranial or calvarial findings. 2. Stable right frontal lobe encephalomalacia and chronic small vessel ischemic changes. 3. No evidence of acute cervical spine fracture, traumatic subluxation or static signs of instability. Electronically Signed   By: Carey Bullocks M.D.   On: 03/05/2017 10:28   Dg Chest Port 1 View  Result Date: 03/05/2017 CLINICAL DATA:  Fall.  Pain left rib area. EXAM: PORTABLE CHEST 1 VIEW COMPARISON:  Chest x-ray 10/06/2016 .  T-spine 08/22/2007 . FINDINGS: Mediastinum hilar structures are normal. Low lung volumes with mild basilar atelectasis and/or scarring. No pleural effusion or pneumothorax. Heart size normal. Degenerative changes thoracic spine. No evidence of displaced rib fracture. IMPRESSION: No acute abnormality identified. No evidence of displaced rib  fracture or pneumothorax. Electronically Signed   By: Maisie Fushomas  Register   On: 03/05/2017 10:43   Dg Hip Unilat W Or Wo Pelvis 2-3 Views Right  Result Date: 03/05/2017 CLINICAL DATA:  Pain following fall EXAM: DG HIP (WITH OR WITHOUT PELVIS) 2-3V RIGHT COMPARISON:  None. FINDINGS: Frontal pelvis as well as frontal and lateral right hip images were obtained. No fracture or dislocation. There is mild symmetric narrowing of both hip joints. No erosive change. There is extensive iliac and femoral artery calcification bilaterally. IMPRESSION: Mild symmetric narrowing both hip joints. No fracture or dislocation. Extensive arterial vascular calcification consistent with atherosclerosis. Electronically Signed   By: Bretta BangWilliam  Woodruff III M.D.   On: 03/05/2017 10:45    EKG:   Bradycardia with irregular heart rate of 40  IMPRESSION AND PLAN:    83 year old male with a history diabetes, PAF and blindness who presents after fall with syncope and symptomatic bradycardia.  1. Syncope after  fall possibly with symptomatic bradycardia [facility] requested Hold rate controlling medications Continue telemetry and cycle cardiac enzymes  2. Fall: PT consult No fractures noted on imaging as above. 3. Diabetes: Continue outpatient insulin with ADA diet and sliding scale  4. BPH: Continue finasteride and tamsulosin  5. PAF: Patient presents with symptomatic bradycardia and therefore have discontinued beta blocker. Continue to monitor heart rate Continue anticoagulation with Pradaxa.   All the records are reviewed and case discussed with ED provider. Management plans discussed with the patient and he in agreement  CODE STATUS: DNR  TOTAL TIME TAKING CARE OF THIS PATIENT: 45 minutes.    Luetta Piazza M.D on 03/05/2017 at 11:12 AM  Between 7am to 6pm - Pager - [phone]  After 6pm go to [email or web address] - Social research officer, [email or web address] [email or web address]  [facility]  Office  [phone]  CC: Primary care physician; Sherlene Shamsullo, Teresa L, MD

## 2017-03-05 NOTE — ED Provider Notes (Signed)
[facility] Emergency Department Provider Note  ____________________________________________   I have reviewed the triage vital signs and the nursing notes.   HISTORY  Chief Complaint Fall    HPI Chad Bailey is a 83 y.o. male who presents today complaining of a fall. He fell last night. According to family he was possibly unconscious for 2 minutes. His blood pressure was 130s over 90s this morning when he awoke and his sugars were normal however he seems a little bit confused so they brought him in. Patient is blind at baseline. He is on Pradaxa, for atrial fibrillation. He did take his blood pressure medications this morning medications are given to him by his son. Among them was metoprolol. Patient denies any pain or symptoms aside from left ankle pain which apparently is chronic. Family is concerned because he passed out, hit his head, and seems a little confused. They can't tell me if was confused before the fall. However he seemed to be somewhat disoriented them. He has not had any other complaints. He specifically denies dysuria or urinary frequency,he denies chest pain shortness of breath, he denies focal numbness or weakness. He does have pain to his head where he hit his head and a bruise there. He has a somewhat poor recollection of the fall but it was witnessed.  Past Medical History:  Diagnosis Date  . Blindness   . Dysuria   . Fracture of left ankle   . Giant cell arteritis (HCC)   . gouty arthritis   . History of tobacco abuse   . Hyperlipidemia   . Hypertension   . Loss of vision 01/2013   left eye  . Non-insulin dependent diabetes mellitus   . PAF (paroxysmal atrial fibrillation) (HCC)    a. on pradaxa; b. CHADS2VASc at least 7 (HTN, age x 2, DM, stroke, vascular disease)  . Retinal artery thrombosis, left 2014  . Thyroid nodule 2014  . Type 2 diabetes mellitus [facility])     Patient Active Problem List   Diagnosis Date Noted  .  Hypoglycemia 10/07/2016  . Shakiness 10/07/2016  . Hypotension 10/07/2016  . Leukocytosis 10/07/2016  . Elevated troponin 10/07/2016  . Hypomagnesemia 10/07/2016  . Altered mental status 10/06/2016  . Benign fibroma of prostate 10/11/2015  . Giant cell arteritis with polymyalgia rheumatica (HCC) 08/31/2015  . Absence of bladder continence 08/28/2015  . Intractable headache 08/23/2015  . Absolute anemia 08/12/2015  . B12 deficiency 07/03/2015  . Glaucoma 06/30/2015  . Peripheral nerve disease 06/30/2015  . Nicotine addiction 06/30/2015  . Vision loss, bilateral 12/19/2014  . Inflammation of uveal tract 12/04/2014  . Preoperative cardiovascular examination 06/18/2014  . Bradycardia 06/18/2014  . Orthostasis 03/22/2013  . Peripheral vascular disease in diabetes mellitus (HCC) 03/01/2013  . CVA (cerebral vascular accident) (HCC) 03/01/2013  . 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 History of tobacco abuse   . gouty arthritis   . Hyperlipidemia 11/04/2010  . HYPERTENSION, BENIGN 11/04/2010  . Atrial fibrillation (HCC) 11/04/2010    Past Surgical History:  Procedure Laterality Date  . EYE SURGERY     right  . LASIK     bilateral eye surgery  . left ankle   1996   s/p surgery with screws    Prior to Admission medications   Medication Sig Start Date End Date Taking? Authorizing Provider  acetaminophen (TYLENOL) 500 MG tablet Take by mouth. Reported on 11/01/2015  [provider]  acyclovir (ZOVIRAX) 400 MG tablet Take 800 mg by mouth 2 (two) times daily.     [provider]  aspirin 81 MG tablet Take 81 mg by mouth daily. Reported on 11/01/2015    [provider]  atorvastatin (LIPITOR) 10 MG tablet Take 1 tablet by mouth daily. 09/30/16   [provider]  Brinzolamide-Brimonidine (SIMBRINZA) 1-0.2 % SUSP Apply to eye 2 (two) times daily.    [provider]   Calcium Carbonate-Vitamin D 600-400 MG-UNIT tablet Take 1 tablet by mouth 2 (two) times daily.  08/30/15 10/06/16  [provider]  cyanocobalamin (,VITAMIN B-12,) 1000 MCG/ML injection Inject 1,000 mcg into the muscle every 30 (thirty) days. 09/25/16 09/20/17  [provider]  ferrous sulfate 325 (65 FE) MG tablet Take 1 tablet by mouth 2 (two) times daily.    [provider]  finasteride (PROSCAR) 5 MG tablet TAKE ONE TABLET BY MOUTH ONCE DAILY 05/29/15   Sherlene Shamsullo, Teresa L, MD  glucose blood test strip Check blood sugar twice daily as instructed. Pt has Contour Next EZ. Dx Z61.096E11.319 08/16/14   Sherlene Shamsullo, Teresa L, MD  HUMALOG 100 UNIT/ML injection Inject 8 Units into the skin 3 (three) times daily with meals.  08/30/15   [provider]  insulin glargine (LANTUS) 100 UNIT/ML injection Inject 0.1 mLs (10 Units total) into the skin at bedtime. Please taper off insulin Lantus by 2 units if you are tapering off prednisone, following your blood glucose levels,  Thank you 10/07/16   Katharina CaperVaickute, Rima, MD  metFORMIN (GLUCOPHAGE) 1000 MG tablet TAKE ONE TABLET BY MOUTH TWICE DAILY WITH MEALS 05/29/15   Sherlene Shamsullo, Teresa L, MD  metoprolol tartrate (LOPRESSOR) 25 MG tablet Take 0.5 tablets (12.5 mg total) by mouth 2 (two) times daily. 08/23/15   Sondra Bargesunn, Ryan M, PA-C  MICROLET LANCETS MISC USE ONE LANCET TWICE DAILY  Dx code:  E11.9 05/30/15   Sherlene Shamsullo, Teresa L, MD  NOVOFINE 30G X 8 MM MISC USE ONE NEEDLE EACH TIME INSULIN IS INJECTED INTO THE SKIN 05/29/15   Sherlene Shamsullo, Teresa L, MD  PRADAXA 150 MG CAPS capsule TAKE ONE CAPSULE BY MOUTH EVERY 12 HOURS 05/28/15   Mariah MillingGollan, Tollie Pizzaimothy J, MD  predniSONE (DELTASONE) 1 MG tablet Take 3 tablets (3 mg total) by mouth daily with breakfast. 10/08/16   Katharina CaperVaickute, Rima, MD  tamsulosin (FLOMAX) 0.4 MG CAPS capsule TAKE ONE CAPSULE BY MOUTH ONCE DAILY 08/21/15   Sherlene Shamsullo, Teresa L, MD  timolol (BETIMOL) 0.5 % ophthalmic solution Apply 1 drop to eye daily.     [provider]  travoprost, benzalkonium, (TRAVATAN) 0.004 % ophthalmic solution Place 1 drop into both eyes at bedtime.     [provider]  vitamin C (ASCORBIC ACID) 500 MG tablet Take 1 tablet by mouth daily.    [provider]    Allergies Patient has no known allergies.  Family History  Problem Relation Age of Onset  . Heart disease Mother   . Tuberculosis Mother   . Lung cancer Father   . Cancer Maternal Grandmother        bowel  . Kidney disease Neg Hx   . Prostate cancer Neg Hx     Social History Social History  Substance Use Topics  . Smoking status: Former Smoker    Packs/day: 6.00    Years: 56.00    Types: Cigarettes, Pipe    Quit date: 10/05/2002  . Smokeless tobacco: Former NeurosurgeonUser  Types: Chew     Comment: smoked 6 pipes/day;smoked cigarettes 1/2 PPD ,quit chewing tobacco in 2004  . Alcohol use No    Review of Systems Constitutional: No fever/chills Eyes: No visual changes. ENT: No sore throat. No stiff neck no neck pain Cardiovascular: Denies chest pain. Respiratory: Denies shortness of breath. Gastrointestinal:   no vomiting.  No diarrhea.  No constipation. Genitourinary: Negative for dysuria. Musculoskeletal: Negative lower extremity swelling Skin: Negative for rash. Neurological: Negative for severe headaches, focal weakness or numbness.   ____________________________________________   PHYSICAL EXAM:  VITAL SIGNS: ED Triage Vitals  Enc Vitals Group     BP 03/05/17 0915 (!) 99/39     Pulse Rate 03/05/17 0915 94     Resp 03/05/17 0915 16     Temp 03/05/17 0915 98.7 F (37.1 C)     Temp Source 03/05/17 0915 Oral     SpO2 03/05/17 0915 98 %     Weight 03/05/17 0916 167 lb (75.8 kg)     Height 03/05/17 0916 5\' 8"  (1.727 m)     Head Circumference --      Peak Flow --      Pain Score 03/05/17 0915 8     Pain Loc --      Pain Edu? --      Excl. in GC? --     Constitutional: Alert and oriented. Well appearing and in no  acute distress. Eyes: Conjunctivae are normal Head: Bruising noted to the right HEENT: No congestion/rhinnorhea. Mucous membranes are moist.  Oropharynx non-erythematous Neck:   Nontender with no meningismus, no masses, no stridor Cardiovascular: Cardiac cardiac and irregular rhythm noted, . Grossly normal heart sounds.  Good peripheral circulation. Respiratory: Normal respiratory effort.  No retractions. Lungs CTAB. Abdominal: Soft and nontender. No distention. No guarding no rebound Back:  There is no focal tenderness or step off.  there is no midline tenderness there are no lesions noted. there is no CVA tenderness Musculoskeletal: , no upper extremity tenderness. No joint effusions, no DVT signs strong distal pulses no edema I range his hips with no tenderness noted does have mild tenderness to the left ankle with no obvious deformity or injury Neurologic:  Normal speech and language. No gross focal neurologic deficits are appreciated.  Skin:  Skin is warm, dry and intact. No rash noted. Psychiatric: Mood and affect are normal. Speech and behavior are normal.  ____________________________________________   LABS (all labs ordered are listed, but only abnormal results are displayed)  Labs Reviewed  CBC WITH DIFFERENTIAL/PLATELET  PROTIME-INR  TROPONIN I  COMPREHENSIVE METABOLIC PANEL  URINALYSIS, COMPLETE (UACMP) WITH MICROSCOPIC  ETHANOL   ____________________________________________  EKG  I personally interpreted any EKGs ordered by me or triage Bradycardia, irregular rhythm, rate 40, normal axis no acute ST elevation or depression no acute ischemia, irregular rhythm ____________________________________________  RADIOLOGY  I reviewed any imaging ordered by me or triage that were performed during my shift and, if possible, patient and/or family made aware of any abnormal findings. ____________________________________________   PROCEDURES  Procedure(s) performed:  None  Procedures  Critical Care performed: None  ____________________________________________   INITIAL IMPRESSION / ASSESSMENT AND PLAN / ED COURSE  Pertinent labs & imaging results that were available during my care of the patient were reviewed by me and considered in my medical decision making (see chart for details).  Patient here for a fall yesterday. A couple things are apparent immediately. Initially he does have evidence of a closed head  injury, he is however at least 12 hours from the fall, we'll obtain a CT scan of his head and neck, in addition, patient seems to have a syncopal event and he is bradycardic this is likely secondary to his beta blockade although other pathology certainly must be considered. We are giving him IV fluid as his blood pressure slightly low here but he is asymptomatic with it, we'll check cardiac enzymes and watch him closely in the emergency department. EKG does not show any acute ischemic changes. Given the fact the family states he did pass out, he is persistently bradycardic here, he'll require admission irrespective of the results of the CT.    ____________________________________________   FINAL CLINICAL IMPRESSION(S) / ED DIAGNOSES  Final diagnoses:  Fall      This chart was dictated using voice recognition software.  Despite best efforts to proofread,  errors can occur which can change meaning.      Jeanmarie Plant, MD 03/05/17 1006

## 2017-03-06 ENCOUNTER — Inpatient Hospital Stay
Admit: 2017-03-06 | Discharge: 2017-03-06 | Disposition: A | Discharge status: Home / Self Care | Payer: Medicare Other | Attending: Internal Medicine | Admitting: Internal Medicine

## 2017-03-06 DIAGNOSIS — R001 Bradycardia, unspecified: Secondary | ICD-10-CM | POA: Diagnosis not present

## 2017-03-06 DIAGNOSIS — S0990XA Unspecified injury of head, initial encounter: Secondary | ICD-10-CM | POA: Diagnosis not present

## 2017-03-06 LAB — BASIC METABOLIC PANEL
Anion gap: 5 (ref 5–15)
BUN: 15 mg/dL (ref 6–20)
CO2: 26 mmol/L (ref 22–32)
CREATININE: 0.67 mg/dL (ref 0.61–1.24)
Calcium: 8.7 mg/dL — ABNORMAL LOW (ref 8.9–10.3)
Chloride: 107 mmol/L (ref 101–111)
GFR calc Af Amer: >60 mL/min (ref >60)
GLUCOSE: 197 mg/dL — AB (ref 65–99)
Potassium: 3.8 mmol/L (ref 3.5–5.1)
SODIUM: 138 mmol/L (ref 135–145)

## 2017-03-06 LAB — CBC
HEMATOCRIT: 37.8 % — AB (ref 40.0–52.0)
Hemoglobin: 12.6 g/dL — ABNORMAL LOW (ref 13.0–18.0)
MCH: 29.8 pg (ref 26.0–34.0)
MCHC: 33.4 g/dL (ref 32.0–36.0)
MCV: 89.1 fL (ref 80.0–100.0)
PLATELETS: 228 10*3/uL (ref 150–440)
RBC: 4.24 MIL/uL — ABNORMAL LOW (ref 4.40–5.90)
RDW: 13.4 % (ref 11.5–14.5)
WBC: 8.6 10*3/uL (ref 3.8–10.6)

## 2017-03-06 LAB — GLUCOSE, CAPILLARY
GLUCOSE-CAPILLARY: 214 mg/dL — AB (ref 65–99)
Glucose-Capillary: 176 mg/dL — ABNORMAL HIGH (ref 65–99)

## 2017-03-06 LAB — TROPONIN I

## 2017-03-06 MED ORDER — LATANOPROST 0.005 % OP SOLN: 1.0000 [drp] | Freq: Every day | OPHTHALMIC | 12 refills | Status: DC

## 2017-03-06 NOTE — Progress Notes (Signed)
[facility]  Patient: Chad Bailey / Admit Date: 03/05/2017 / Date of Encounter: 03/06/2017, 6:34 AM   Subjective: Patient is feeling much better breathing much better and not having any significant dizziness weakness or fatigue overnight. After discontinuation of beta blocker heart rate is significantly improved into the 70 beat per minute range up from the 40 beat per minute range. There is no evidence of myocardial infarction or congestive heart failure  Review of Systems: Positive for: None Negative for: Vision change, hearing change, syncope, dizziness, nausea, vomiting,diarrhea, bloody stool, stomach pain, cough, congestion, diaphoresis, urinary frequency, urinary pain,skin lesions, skin rashes Others previously listed  Objective: Telemetry: Atrial fibrillation with controlled ventricular rate Physical Exam: Blood pressure (!) 141/82, pulse 85, temperature 98.2 F (36.8 C), temperature source Oral, resp. rate 18, height 5\' 8"  (1.727 m), weight 73.4 kg (161 lb 14.4 oz), SpO2 90 %. Body mass index is 24.62 kg/m. General: Well developed, well nourished, in no acute distress. Head: Normocephalic, atraumatic, sclera non-icteric, no xanthomas, nares are without discharge. Neck: No apparent masses Lungs: Normal respirations with no wheezes, no rhonchi, no rales , no crackles   Heart: Irregular rate and rhythm, normal S1 S2, no murmur, no rub, no gallop, PMI is normal size and placement, carotid upstroke normal without bruit, jugular venous pressure normal Abdomen: Soft, non-tender, non-distended with normoactive bowel sounds. No hepatosplenomegaly. Abdominal aorta is normal size without bruit Extremities: Trace edema, no clubbing, no cyanosis, no ulcers,  Peripheral: 2+ radial, 2+ femoral, 2+ dorsal pedal pulses Neuro: Alert and oriented. Moves all extremities spontaneously. Psych:  Responds to questions appropriately with a normal  affect.   Intake/Output Summary (Last 24 hours) at 03/06/17 0634 Last data filed at 03/05/17 2208  Gross per 24 hour  Intake                0 ml  Output                0 ml  Net                0 ml    Inpatient Medications:  . atorvastatin  10 mg Oral Daily  . calcium-vitamin D  1 tablet Oral BID  . [START ON 03/31/2017] cyanocobalamin  1,000 mcg Intramuscular Q30 days  . dabigatran  150 mg Oral Q12H  . ferrous sulfate  325 mg Oral BID  . finasteride  5 mg Oral Daily  . insulin aspart  0-5 Units Subcutaneous QHS  . insulin aspart  0-9 Units Subcutaneous TID WC  . insulin glargine  13 Units Subcutaneous QHS  . latanoprost  1 drop Both Eyes QHS  . multivitamin with minerals  1 tablet Oral Daily  . predniSONE  1 mg Oral Q breakfast  . tamsulosin  0.4 mg Oral Daily  . timolol  1 drop Both Eyes BID  . vitamin C  500 mg Oral Daily   Infusions:   Labs:  Recent Labs  03/05/17 0933 03/06/17 0207  NA 138 138  K 4.0 3.8  CL 105 107  CO2 27 26  GLUCOSE 186* 197*  BUN 18 15  CREATININE 0.79 0.67  CALCIUM 9.3 8.7*    Recent Labs  03/05/17 0933  AST 25  ALT 22  ALKPHOS 56  BILITOT 1.0  PROT 6.4*  ALBUMIN 3.7    Recent Labs  03/05/17 0933 03/06/17 0207  WBC 9.0 8.6  NEUTROABS 6.3  --   HGB 14.0 12.6*  HCT 41.7 37.8*  MCV 90.1 89.1  PLT 252 228    Recent Labs  03/05/17 0933 03/05/17 1438 03/05/17 2029 03/06/17 0207  TROPONINI <0.03 <0.03 <0.03 <0.03   Invalid input(s): POCBNP No results for input(s): HGBA1C in the last 72 hours.   Weights: Filed Weights   03/05/17 0916 03/05/17 1417  Weight: 75.8 kg (167 lb) 73.4 kg (161 lb 14.4 oz)     Radiology/Studies:  Dg Ankle Complete Left  Result Date: 03/05/2017 CLINICAL DATA:  Mechanical fall.  Lateral left ankle pain. EXAM: LEFT ANKLE COMPLETE - 3+ VIEW COMPARISON:  Ankle radiographs 09/24/2014. FINDINGS: Soft tissue swelling is present over the lateral malleolus. The is located. There is no underlying  fracture. Remote ORIF of the medial malleoli this is again noted. Lucency about the distal ends of screws is similar the prior study. Vascular calcifications are present. Calcifications along the plantar fascia are again noted. IMPRESSION: 1. Soft tissue swelling over the lateral malleolus without underlying fracture. 2. Stable appearance of remote medial malleolus ORIF. 3. Vascular calcifications compatible with known type 2 diabetes. 4. Plantar calcaneal spur and calcification along the plantar fascia likely related to plantar fasciitis. Electronically Signed   By: Marin Roberts M.D.   On: 03/05/2017 10:42   Ct Head Wo Contrast  Result Date: 03/05/2017 CLINICAL DATA:  83 year old fell last night. Possible loss of consciousness for 2 minutes. Confusion. EXAM: CT HEAD WITHOUT CONTRAST CT CERVICAL SPINE WITHOUT CONTRAST TECHNIQUE: Multidetector CT imaging of the head and cervical spine was performed following the standard protocol without intravenous contrast. Multiplanar CT image reconstructions of the cervical spine were also generated. COMPARISON:  CT head 10/06/2016 and 08/06/2015. Thoracic spine radiographs 08/22/2007. FINDINGS: CT HEAD FINDINGS Brain: There is no evidence of acute intracranial hemorrhage, mass lesion, brain edema or extra-axial fluid collection. There is stable mild generalized atrophy. There is no CT evidence of acute cortical infarction. Chronic small vessel ischemic changes in the periventricular white matter again noted with stable asymmetric subcortical encephalomalacia in the right frontal lobe. Vascular: Intracranial vascular calcifications are noted. Skull: Negative for fracture or focal lesion. Sinuses/Orbits: The visualized paranasal sinuses, mastoid air cells and middle ears are clear. Probable postsurgical changes in both globes with lens implant on the right. Other: None. CT CERVICAL SPINE FINDINGS Alignment: Exaggerated thoracic kyphosis. The cervical alignment is  normal. There is no focal angulation or listhesis. Skull base and vertebrae: There is mild depression of the inferior endplate of C6 posteriorly on the sagittal images. This does not appear acute and appears grossly stable from prior radiographs. No evidence of acute fracture or traumatic subluxation. Soft tissues and spinal canal: No prevertebral fluid or swelling. No visible canal hematoma. Prominent carotid atherosclerosis bilaterally. There is a small amount of air within the lumen of the right jugular vein, likely due to a venous catheter. Disc levels: Mild spondylosis for age with mild discal calcification. No high-grade spinal stenosis or nerve root encroachment. Upper chest: Unremarkable. Other: None. IMPRESSION: 1. No acute intracranial or calvarial findings. 2. Stable right frontal lobe encephalomalacia and chronic small vessel ischemic changes. 3. No evidence of acute cervical spine fracture, traumatic subluxation or static signs of instability. Electronically Signed   By: Carey Bullocks M.D.   On: 03/05/2017 10:28   Ct Cervical Spine Wo Contrast  Result Date: 03/05/2017 CLINICAL DATA:  83 year old fell last night. Possible loss of consciousness for 2 minutes. Confusion. EXAM: CT HEAD WITHOUT CONTRAST CT CERVICAL SPINE WITHOUT CONTRAST TECHNIQUE: Multidetector CT  imaging of the head and cervical spine was performed following the standard protocol without intravenous contrast. Multiplanar CT image reconstructions of the cervical spine were also generated. COMPARISON:  CT head 10/06/2016 and 08/06/2015. Thoracic spine radiographs 08/22/2007. FINDINGS: CT HEAD FINDINGS Brain: There is no evidence of acute intracranial hemorrhage, mass lesion, brain edema or extra-axial fluid collection. There is stable mild generalized atrophy. There is no CT evidence of acute cortical infarction. Chronic small vessel ischemic changes in the periventricular white matter again noted with stable asymmetric subcortical  encephalomalacia in the right frontal lobe. Vascular: Intracranial vascular calcifications are noted. Skull: Negative for fracture or focal lesion. Sinuses/Orbits: The visualized paranasal sinuses, mastoid air cells and middle ears are clear. Probable postsurgical changes in both globes with lens implant on the right. Other: None. CT CERVICAL SPINE FINDINGS Alignment: Exaggerated thoracic kyphosis. The cervical alignment is normal. There is no focal angulation or listhesis. Skull base and vertebrae: There is mild depression of the inferior endplate of C6 posteriorly on the sagittal images. This does not appear acute and appears grossly stable from prior radiographs. No evidence of acute fracture or traumatic subluxation. Soft tissues and spinal canal: No prevertebral fluid or swelling. No visible canal hematoma. Prominent carotid atherosclerosis bilaterally. There is a small amount of air within the lumen of the right jugular vein, likely due to a venous catheter. Disc levels: Mild spondylosis for age with mild discal calcification. No high-grade spinal stenosis or nerve root encroachment. Upper chest: Unremarkable. Other: None. IMPRESSION: 1. No acute intracranial or calvarial findings. 2. Stable right frontal lobe encephalomalacia and chronic small vessel ischemic changes. 3. No evidence of acute cervical spine fracture, traumatic subluxation or static signs of instability. Electronically Signed   By: Carey Bullocks M.D.   On: 03/05/2017 10:28   Dg Chest Port 1 View  Result Date: 03/05/2017 CLINICAL DATA:  Fall.  Pain left rib area. EXAM: PORTABLE CHEST 1 VIEW COMPARISON:  Chest x-ray 10/06/2016 .  T-spine 08/22/2007 . FINDINGS: Mediastinum hilar structures are normal. Low lung volumes with mild basilar atelectasis and/or scarring. No pleural effusion or pneumothorax. Heart size normal. Degenerative changes thoracic spine. No evidence of displaced rib fracture. IMPRESSION: No acute abnormality identified. No  evidence of displaced rib fracture or pneumothorax. Electronically Signed   By: Maisie Fus  Register   On: 03/05/2017 10:43   Dg Hip Unilat W Or Wo Pelvis 2-3 Views Right  Result Date: 03/05/2017 CLINICAL DATA:  Pain following fall EXAM: DG HIP (WITH OR WITHOUT PELVIS) 2-3V RIGHT COMPARISON:  None. FINDINGS: Frontal pelvis as well as frontal and lateral right hip images were obtained. No fracture or dislocation. There is mild symmetric narrowing of both hip joints. No erosive change. There is extensive iliac and femoral artery calcification bilaterally. IMPRESSION: Mild symmetric narrowing both hip joints. No fracture or dislocation. Extensive arterial vascular calcification consistent with atherosclerosis. Electronically Signed   By: Bretta Bang III M.D.   On: 03/05/2017 10:45     Assessment and Recommendation  83 y.o. male with the diabetes with complication essential hypertension with the chronic nonvalvular atrial fibrillation previously treated with beta blocker which caused a significant symptomatic bradycardia and syncope now resolved after discontinuation of medication management and no current evidence of heart failure or myocardial infarction 1. Abstain from beta blocker due to symptomatic bradycardia causing syncope 2. Continue anticoagulation without change for further risk reduction in stroke with atrial fibrillation 3. High intensity cholesterol therapy for further risk reduction cardiovascular event 4. Further  hypertension control with hydralazine or other non-beta blocker medications if necessary 5. Begin ambulation and follow for improvements of symptoms and possible discharge home today with follow-up next week  Signed, Arnoldo Hooker M.D. FACC

## 2017-03-06 NOTE — Progress Notes (Signed)
Pt alert and oriented x4, no complaints of pain or discomfort.  Bed in low position, call bell within reach.  Bed alarms on and functioning.  Assessment done and charted.  Pt is noted to be blind in both eyes so extra assistance is given to the pt.  Wife at bedside.  Will continue to monitor and do hourly rounding throughout the shift

## 2017-03-06 NOTE — Evaluation (Signed)
Physical Therapy Evaluation Patient Details Name: Chad Bailey MRN: [id number] DOB: [birth year] Today's Date: 03/06/2017   History of Present Illness  Chad Bailey is a 83 y.o. male with a known history of blindness, diabetes and PAF who presents with above complaint. Family reports that he was getting out of his car and stumbled on something and fell on the concrete floor. They brought him to the emergency room because the patient was worried about some swelling on his head. CT scan of the head shows no ICH or hematoma. Patient is denying any chest pain or shortness of breath prior to this event or dizziness. Family reports that when patient fell he was down for 2 minutes.In the emergency room his heart rates are noted to be in the 40s. Pt is now admitted for syncope and fall secondary to symptomatic bradycardia.   Clinical Impression  Pt admitted with above diagnosis. Pt currently with functional limitations due to the deficits listed below (see PT Problem List).  Pt appears to be close to his baseline mobility per patient and wife report. He moves slowly but this is normal for him. Pt ambulates with short, shuffling steps. Utilized spc with single HHA for additional support and direction. Pt mildly unsteady but mostly able to self correct. Would be much safer with a rolling walker. Pt requires leading for direction due to visual impairment. Denies DOE during ambulation and no signs of respiratory distress. Pt denies dizziness with transfers or ambulation. He would benefit from [facility] PT for balance and strengthening as well as safety at home. Pt and wife decline at this time. Encouraged pt to utilize his rolling walker at discharge. Pt will benefit from skilled PT services to address deficits in strength, balance, and mobility in order to return to full function at home.     Follow Up Recommendations Home health PT;Other (comment) (Pt refuses further PT after discharge)    Equipment Recommendations   None recommended by PT;Other (comment) (Should use his walker at discharge)    Recommendations for Other Services       Precautions / Restrictions Precautions Precautions: Fall Restrictions Weight Bearing Restrictions: No      Mobility  Bed Mobility Overal bed mobility: Modified Independent             General bed mobility comments: HOB elevated and bed rails utilized. Increased time required to perform  Transfers Overall transfer level: Needs assistance Equipment used: Straight cane Transfers: Sit to/from Stand Sit to Stand: Min guard         General transfer comment: Pt mildly unstable but able to self correct. Increased time required to come to standing due to bilateral LE weakness  Ambulation/Gait Ambulation/Gait assistance: Min assist Ambulation Distance (Feet): 100 Feet Assistive device: Straight cane;1 person hand held assist Gait Pattern/deviations: Decreased step length - right;Decreased step length - left Gait velocity: Decreased Gait velocity interpretation: <1.8 ft/sec, indicative of risk for recurrent falls General Gait Details: Pt ambulates with short, shuffling steps. Utilized spc with single HHA for additional support and direction. Pt mildly unsteady but mostly able to self correct. Would be much safer with a rolling walker. Pt requires leading for direction due to visual impairment. Denies DOE during ambulation and no signs of respiratory distress. Pt denies dizziness with transfesr or ambulation  Stairs            Wheelchair Mobility    Modified Rankin (Stroke Patients Only)       Balance Overall balance assessment: Needs  assistance Sitting-balance support: No upper extremity supported Sitting balance-Leahy Scale: Good     Standing balance support: No upper extremity supported Standing balance-Leahy Scale: Fair Standing balance comment: Pt with fair balance in wide stance. Unable to maintain feet together posture due to LOB                              Pertinent Vitals/Pain Pain Assessment: 0-10 Pain Score: 6  Pain Location: R hip and low back Pain Descriptors / Indicators: Throbbing Pain Intervention(s): Premedicated before session    Home Living Family/patient expects to be discharged to:: Private residence Living Arrangements: Spouse/significant other;Children Available Help at Discharge: Family;Available 24 hours/day;Other (Comment) (Son and wife stay with patient) Type of Home: House Home Access: Ramped entrance     Home Layout: Multi-level;Able to live on main level with bedroom/bathroom Home Equipment: Dan HumphreysWalker - 2 wheels;Wheelchair - Warehouse managermanual;Transport chair;Cane - single point;Tub bench      Prior Function Level of Independence: Needs assistance   Gait / Transfers Assistance Needed: used SPC or walker at home, prefers to use spc. Per wife pt ambulates slowly  ADL's / Homemaking Assistance Needed: Independent with ADLs. Assist with IADLs. Wife and son assists with IADLs        Hand Dominance   Dominant Hand: Right    Extremity/Trunk Assessment   Upper Extremity Assessment Upper Extremity Assessment: Overall WFL for tasks assessed    Lower Extremity Assessment Lower Extremity Assessment: Generalized weakness       Communication   Communication: No difficulties  Cognition Arousal/Alertness: Awake/alert Behavior During Therapy: WFL for tasks assessed/performed Overall Cognitive Status: Within Functional Limits for tasks assessed                                        General Comments      Exercises     Assessment/Plan    PT Assessment Patient needs continued PT services  PT Problem List Decreased strength;Decreased activity tolerance;Decreased balance;Decreased mobility;Decreased knowledge of use of DME;Decreased safety awareness       PT Treatment Interventions DME instruction;Gait training;Stair training;Functional mobility training;Therapeutic  activities;Therapeutic exercise;Balance training;Neuromuscular re-education;Patient/family education    PT Goals (Current goals can be found in the Care Plan section)  Acute Rehab PT Goals Patient Stated Goal: Return home PT Goal Formulation: With patient/family Time For Goal Achievement: 03/20/17 Potential to Achieve Goals: Good    Frequency Min 2X/week   Barriers to discharge        Co-evaluation               AM-PAC PT "6 Clicks" Daily Activity  Outcome Measure Difficulty turning over in bed (including adjusting bedclothes, sheets and blankets)?: A Little Difficulty moving from lying on back to sitting on the side of the bed? : A Little Difficulty sitting down on and standing up from a chair with arms (e.g., wheelchair, bedside commode, etc,.)?: A Little Help needed moving to and from a bed to chair (including a wheelchair)?: A Little Help needed walking in hospital room?: A Little Help needed climbing 3-5 steps with a railing? : A Lot 6 Click Score: 17    End of Session Equipment Utilized During Treatment: Gait belt Activity Tolerance: Patient tolerated treatment well Patient left: in bed;with call bell/phone within reach;with bed alarm set;with family/visitor present Nurse Communication: Mobility status;Other (comment) (Discharge recommendations)  PT Visit Diagnosis: Unsteadiness on feet (R26.81);Muscle weakness (generalized) (M62.81);Difficulty in walking, not elsewhere classified (R26.2)    Time: 4098-1191 PT Time Calculation (min) (ACUTE ONLY): 20 min   Charges:   PT Evaluation $PT Eval Moderate Complexity: 1 Procedure     PT G Codes:   PT G-Codes **NOT FOR INPATIENT CLASS** Functional Assessment Tool Used: AM-PAC 6 Clicks Basic Mobility Functional Limitation: Mobility: Walking and moving around Mobility: Walking and Moving Around Current Status ([id number]): At least 40 percent but less than 60 percent impaired, limited or restricted Mobility: Walking and  Moving Around Goal Status [phone]): At least 20 percent but less than 40 percent impaired, limited or restricted    Lynnea Maizes PT, DPT    Rechy Bost 03/06/2017, 2:53 PM

## 2017-03-06 NOTE — Discharge Instructions (Signed)
[facility] at [facility]  DIET:  Cardiac diet  DISCHARGE CONDITION:  Stable  ACTIVITY:  Activity as tolerated  OXYGEN:  Home Oxygen: No.   Oxygen Deliver: none  DISCHARGE LOCATION:  home    ADDITIONAL DISCHARGE INSTRUCTION: please keep log of bp and heart rate and take it to primary md   If you experience worsening of your admission symptoms, develop shortness of breath, life threatening emergency, suicidal or homicidal thoughts you must seek medical attention immediately by calling 911 or calling your MD immediately  if symptoms less severe.  You Must read complete instructions/literature along with all the possible adverse reactions/side effects for all the Medicines you take and that have been prescribed to you. Take any new Medicines after you have completely understood and accpet all the possible adverse reactions/side effects.   Please note  You were cared for by a hospitalist during your hospital stay. If you have any questions about your discharge medications or the care you received while you were in the hospital after you are discharged, you can call the unit and asked to speak with the hospitalist on call if the hospitalist that took care of you is not available. Once you are discharged, your primary care physician will handle any further medical issues. Please note that NO REFILLS for any discharge medications will be authorized once you are discharged, as it is imperative that you return to your primary care physician (or establish a relationship with a primary care physician if you do not have one) for your aftercare needs so that they can reassess your need for medications and monitor your lab values.

## 2017-03-06 NOTE — Progress Notes (Signed)
Yesterday patient wished to be DO NOT RESUSCITATE, I filled the paperwork for DO NOT RESUSCITATE to give to take home received a call from  nurse stating that they would like to CODE STATUS to be reinstated to full code. As per patient's wishes we will make him a full code.

## 2017-03-06 NOTE — Discharge Summary (Signed)
[facility] - [place] at [facility] [place], 83 y.o., DOB 1929-10-10, MRN [id number]. Admission date: 03/05/2017 Discharge Date 03/06/2017 Primary MD Sherlene Shams, MD Admitting Physician Adrian Saran, MD  Admission Diagnosis  Bradycardia [R00.1] Fall [W19.XXXA] Closed head injury, initial encounter [S09.90XA] Syncope, unspecified syncope type [R55]  Discharge Diagnosis   Active Problems:   Bradycardia    Syncope due to bradycardia   Fall  BPH  PAF Blindness  Diabetes type 2 History of giant cell arthritis  Essential hypertension Hyperlipidemia       Hospital Course  Chad Bailey  is a 83 y.o. male with a known history of Blindness, diabetes and PAF who presents with above complaint. Family reports that he was getting out of his car and stumbled on something and fell on the concrete floor. They brought him to the emergency room because the patient was worried about some swelling on his head. CT scan of the head shows no ICH or hematoma. Patient is denying any chest pain or shortness of breath prior to this event or dizziness. Family reports that when patient fell he was down for 2 minutes. Pt noted to have heart rate in the 40s in the emergency room. He was admitted for further evaluation. He was on metoprolol which has been discontinued. This morning heart rate in the 70s she is feeling much better not dizzy he has been very anxious to go home since earlier today. I          Consults  cardiology  Significant Tests:  See full reports for all details     Dg Ankle Complete Left  Result Date: 03/05/2017 CLINICAL DATA:  Mechanical fall.  Lateral left ankle pain. EXAM: LEFT ANKLE COMPLETE - 3+ VIEW COMPARISON:  Ankle radiographs 09/24/2014. FINDINGS: Soft tissue swelling is present over the lateral malleolus. The is located. There is no underlying fracture. Remote ORIF of the medial malleoli this is again noted. Lucency about the distal ends of screws is  similar the prior study. Vascular calcifications are present. Calcifications along the plantar fascia are again noted. IMPRESSION: 1. Soft tissue swelling over the lateral malleolus without underlying fracture. 2. Stable appearance of remote medial malleolus ORIF. 3. Vascular calcifications compatible with known type 2 diabetes. 4. Plantar calcaneal spur and calcification along the plantar fascia likely related to plantar fasciitis. Electronically Signed   By: Marin Roberts M.D.   On: 03/05/2017 10:42   Ct Head Wo Contrast  Result Date: 03/05/2017 CLINICAL DATA:  83 year old fell last night. Possible loss of consciousness for 2 minutes. Confusion. EXAM: CT HEAD WITHOUT CONTRAST CT CERVICAL SPINE WITHOUT CONTRAST TECHNIQUE: Multidetector CT imaging of the head and cervical spine was performed following the standard protocol without intravenous contrast. Multiplanar CT image reconstructions of the cervical spine were also generated. COMPARISON:  CT head 10/06/2016 and 08/06/2015. Thoracic spine radiographs 08/22/2007. FINDINGS: CT HEAD FINDINGS Brain: There is no evidence of acute intracranial hemorrhage, mass lesion, brain edema or extra-axial fluid collection. There is stable mild generalized atrophy. There is no CT evidence of acute cortical infarction. Chronic small vessel ischemic changes in the periventricular white matter again noted with stable asymmetric subcortical encephalomalacia in the right frontal lobe. Vascular: Intracranial vascular calcifications are noted. Skull: Negative for fracture or focal lesion. Sinuses/Orbits: The visualized paranasal sinuses, mastoid air cells and middle ears are clear. Probable postsurgical changes in both globes with lens implant on the right. Other: None. CT CERVICAL SPINE FINDINGS Alignment: Exaggerated thoracic  kyphosis. The cervical alignment is normal. There is no focal angulation or listhesis. Skull base and vertebrae: There is mild depression of the  inferior endplate of C6 posteriorly on the sagittal images. This does not appear acute and appears grossly stable from prior radiographs. No evidence of acute fracture or traumatic subluxation. Soft tissues and spinal canal: No prevertebral fluid or swelling. No visible canal hematoma. Prominent carotid atherosclerosis bilaterally. There is a small amount of air within the lumen of the right jugular vein, likely due to a venous catheter. Disc levels: Mild spondylosis for age with mild discal calcification. No high-grade spinal stenosis or nerve root encroachment. Upper chest: Unremarkable. Other: None. IMPRESSION: 1. No acute intracranial or calvarial findings. 2. Stable right frontal lobe encephalomalacia and chronic small vessel ischemic changes. 3. No evidence of acute cervical spine fracture, traumatic subluxation or static signs of instability. Electronically Signed   By: Carey BullocksWilliam  Veazey M.D.   On: 03/05/2017 10:28   Ct Cervical Spine Wo Contrast  Result Date: 03/05/2017 CLINICAL DATA:  [age] year old fell last night. Possible loss of consciousness for 2 minutes. Confusion. EXAM: CT HEAD WITHOUT CONTRAST CT CERVICAL SPINE WITHOUT CONTRAST TECHNIQUE: Multidetector CT imaging of the head and cervical spine was performed following the standard protocol without intravenous contrast. Multiplanar CT image reconstructions of the cervical spine were also generated. COMPARISON:  CT head 10/06/2016 and 08/06/2015. Thoracic spine radiographs 08/22/2007. FINDINGS: CT HEAD FINDINGS Brain: There is no evidence of acute intracranial hemorrhage, mass lesion, brain edema or extra-axial fluid collection. There is stable mild generalized atrophy. There is no CT evidence of acute cortical infarction. Chronic small vessel ischemic changes in the periventricular white matter again noted with stable asymmetric subcortical encephalomalacia in the right frontal lobe. Vascular: Intracranial vascular calcifications are noted. Skull:  Negative for fracture or focal lesion. Sinuses/Orbits: The visualized paranasal sinuses, mastoid air cells and middle ears are clear. Probable postsurgical changes in both globes with lens implant on the right. Other: None. CT CERVICAL SPINE FINDINGS Alignment: Exaggerated thoracic kyphosis. The cervical alignment is normal. There is no focal angulation or listhesis. Skull base and vertebrae: There is mild depression of the inferior endplate of C6 posteriorly on the sagittal images. This does not appear acute and appears grossly stable from prior radiographs. No evidence of acute fracture or traumatic subluxation. Soft tissues and spinal canal: No prevertebral fluid or swelling. No visible canal hematoma. Prominent carotid atherosclerosis bilaterally. There is a small amount of air within the lumen of the right jugular vein, likely due to a venous catheter. Disc levels: Mild spondylosis for age with mild discal calcification. No high-grade spinal stenosis or nerve root encroachment. Upper chest: Unremarkable. Other: None. IMPRESSION: 1. No acute intracranial or calvarial findings. 2. Stable right frontal lobe encephalomalacia and chronic small vessel ischemic changes. 3. No evidence of acute cervical spine fracture, traumatic subluxation or static signs of instability. Electronically Signed   By: Carey BullocksWilliam  Veazey M.D.   On: 03/05/2017 10:28   Dg Chest Port 1 View  Result Date: 03/05/2017 CLINICAL DATA:  Fall.  Pain left rib area. EXAM: PORTABLE CHEST 1 VIEW COMPARISON:  Chest x-ray 10/06/2016 .  T-spine 08/22/2007 . FINDINGS: Mediastinum hilar structures are normal. Low lung volumes with mild basilar atelectasis and/or scarring. No pleural effusion or pneumothorax. Heart size normal. Degenerative changes thoracic spine. No evidence of displaced rib fracture. IMPRESSION: No acute abnormality identified. No evidence of displaced rib fracture or pneumothorax. Electronically Signed   By: Maisie Fushomas  Register  On:  03/05/2017 10:43   Dg Hip Unilat W Or Wo Pelvis 2-3 Views Right  Result Date: 03/05/2017 CLINICAL DATA:  Pain following fall EXAM: DG HIP (WITH OR WITHOUT PELVIS) 2-3V RIGHT COMPARISON:  None. FINDINGS: Frontal pelvis as well as frontal and lateral right hip images were obtained. No fracture or dislocation. There is mild symmetric narrowing of both hip joints. No erosive change. There is extensive iliac and femoral artery calcification bilaterally. IMPRESSION: Mild symmetric narrowing both hip joints. No fracture or dislocation. Extensive arterial vascular calcification consistent with atherosclerosis. Electronically Signed   By: Bretta Bang III M.D.   On: 03/05/2017 10:45       Today   Subjective:   Chad Bailey  Patient feels well one should go home  Objective:   Blood pressure 139/66, pulse 100, temperature 98.1 F (36.7 C), temperature source Oral, resp. rate 18, height 5\' 8"  (1.727 m), weight 161 lb 14.4 oz (73.4 kg), SpO2 93 %.  .  Intake/Output Summary (Last 24 hours) at 03/06/17 1451 Last data filed at 03/06/17 1417  Gross per 24 hour  Intake              240 ml  Output                0 ml  Net              240 ml    Exam VITAL SIGNS: Blood pressure 139/66, pulse 100, temperature 98.1 F (36.7 C), temperature source Oral, resp. rate 18, height 5\' 8"  (1.727 m), weight 161 lb 14.4 oz (73.4 kg), SpO2 93 %.  GENERAL:  83 y.o.-year-old patient lying in the bed with no acute distress.  EYES: Pupils equal, round, reactive to light and accommodation. No scleral icterus. Extraocular muscles intact.  HEENT: Head atraumatic, normocephalic. Oropharynx and nasopharynx clear.  NECK:  Supple, no jugular venous distention. No thyroid enlargement, no tenderness.  LUNGS: Normal breath sounds bilaterally, no wheezing, rales,rhonchi or crepitation. No use of accessory muscles of respiration.  CARDIOVASCULAR: S1, S2 normal. No murmurs, rubs, or gallops.  ABDOMEN: Soft, nontender,  nondistended. Bowel sounds present. No organomegaly or mass.  EXTREMITIES: No pedal edema, cyanosis, or clubbing.  NEUROLOGIC: Cranial nerves II through XII are intact. Muscle strength 5/5 in all extremities. Sensation intact. Gait not checked.  PSYCHIATRIC: The patient is alert and oriented x 3.  SKIN: No obvious rash, lesion, or ulcer.   Data Review     CBC w Diff: Lab Results  Component Value Date   WBC 8.6 03/06/2017   HGB 12.6 (L) 03/06/2017   HGB 11.2 (L) 09/24/2014   HCT 37.8 (L) 03/06/2017   HCT 35.5 (L) 08/23/2015   PLT 228 03/06/2017   PLT 964 (HH) 08/23/2015   LYMPHOPCT 17 03/05/2017   LYMPHOPCT 30.9 09/24/2014   MONOPCT 10 03/05/2017   MONOPCT 10.9 09/24/2014   EOSPCT 2 03/05/2017   EOSPCT 1.5 09/24/2014   BASOPCT 1 03/05/2017   BASOPCT 1.0 09/24/2014   CMP: Lab Results  Component Value Date   NA 138 03/06/2017   NA 135 (L) 08/23/2015   NA 140 09/24/2014   K 3.8 03/06/2017   K 4.1 09/24/2014   CL 107 03/06/2017   CL 102 09/24/2014   CO2 26 03/06/2017   CO2 31 09/24/2014   BUN 15 03/06/2017   BUN 20 08/23/2015   BUN 17 09/24/2014   CREATININE 0.67 03/06/2017   CREATININE 0.81 09/24/2014   CREATININE 0.68 04/13/2012  PROT 6.4 (L) 03/05/2017   PROT 6.4 08/23/2015   ALBUMIN 3.7 03/05/2017   ALBUMIN 3.4 (L) 08/23/2015   BILITOT 1.0 03/05/2017   BILITOT 0.4 08/23/2015   ALKPHOS 56 03/05/2017   AST 25 03/05/2017   ALT 22 03/05/2017  .  Micro Results No results found for this or any previous visit (from the past 240 hour(s)).      Code Status Orders        Start     Ordered   03/06/17 1146  Full code  Continuous     03/06/17 1145    Code Status History    Date Active Date Inactive Code Status Order ID Comments User Context   03/05/2017  2:36 PM 03/06/2017 11:45 AM DNR [id number]  Adrian Saran, MD Inpatient   10/06/2016  7:57 AM 10/07/2016  7:49 PM Full Code [id number]  Arnaldo Natal, MD Inpatient    Advance Directive Documentation     Most  Recent Value  Type of Advance Directive  Living will, Healthcare Power of Attorney  Pre-existing out of facility DNR order (yellow form or pink MOST form)  -  "MOST" Form in Place?  -          Follow-up Information    Callwood, Gerda Diss D, MD. Schedule an appointment as soon as possible for a visit in 1 week.   Specialties:  Cardiology, Internal Medicine Why:  bradycardia, echo results Contact information: [address] [facility] - CARDIOLOGY [place] [address] [phone]        Sherlene Shams, MD. Schedule an appointment as soon as possible for a visit in 1 week.   Specialty:  Internal Medicine Why:  hospital f/u Contact information: [address] [place] [address] [phone]           Discharge Medications   Allergies as of 03/06/2017   No Known Allergies     Medication List    STOP taking these medications   metoprolol tartrate 25 MG tablet Commonly known as:  LOPRESSOR     TAKE these medications   acetaminophen 500 MG tablet Commonly known as:  TYLENOL Take 500 mg by mouth every 6 (six) hours as needed. Reported on 11/01/2015   atorvastatin 10 MG tablet Commonly known as:  LIPITOR Take 10 mg by mouth daily.   Calcium Carbonate-Vitamin D 600-400 MG-UNIT tablet Take 1 tablet by mouth 2 (two) times daily.   cyanocobalamin 1000 MCG/ML injection Commonly known as:  (VITAMIN B-12) Inject 1,000 mcg into the muscle every 30 (thirty) days.   ferrous sulfate 325 (65 FE) MG tablet Take 1 tablet by mouth 2 (two) times daily.   finasteride 5 MG tablet Commonly known as:  PROSCAR TAKE ONE TABLET BY MOUTH ONCE DAILY   glucose blood test strip Check blood sugar twice daily as instructed. Pt has Contour Next EZ. Dx E11.319   HUMALOG 100 UNIT/ML injection Generic drug:  insulin lispro Inject 7 Units into the skin 3 (three) times daily with meals.   insulin glargine 100 UNIT/ML injection Commonly known as:   LANTUS Inject 0.1 mLs (10 Units total) into the skin at bedtime. Please taper off insulin Lantus by 2 units if you are tapering off prednisone, following your blood glucose levels,  Thank you What changed:  how much to take  additional instructions   latanoprost 0.005 % ophthalmic solution Commonly known as:  XALATAN Place 1 drop into both eyes at bedtime.   metFORMIN  1000 MG tablet Commonly known as:  GLUCOPHAGE TAKE ONE TABLET BY MOUTH TWICE DAILY WITH MEALS   MICROLET LANCETS Misc USE ONE LANCET TWICE DAILY  Dx code:  E11.9   multivitamin capsule Take 1 capsule by mouth daily.   NOVOFINE 30G X 8 MM Misc Generic drug:  Insulin Pen Needle USE ONE NEEDLE EACH TIME INSULIN IS INJECTED INTO THE SKIN   PRADAXA 150 MG Caps capsule Generic drug:  dabigatran TAKE ONE CAPSULE BY MOUTH EVERY 12 HOURS   predniSONE 1 MG tablet Commonly known as:  DELTASONE Take 3 tablets (3 mg total) by mouth daily with breakfast. What changed:  how much to take   SIMBRINZA 1-0.2 % Susp Generic drug:  Brinzolamide-Brimonidine Apply to eye 2 (two) times daily.   tamsulosin 0.4 MG Caps capsule Commonly known as:  FLOMAX TAKE ONE CAPSULE BY MOUTH ONCE DAILY   timolol 0.5 % ophthalmic solution Commonly known as:  BETIMOL Place 1 drop into both eyes 2 (two) times daily.   travoprost (benzalkonium) 0.004 % ophthalmic solution Commonly known as:  TRAVATAN Place 1 drop into both eyes at bedtime.   vitamin C 500 MG tablet Commonly known as:  ASCORBIC ACID Take 1 tablet by mouth daily.          Total Time in preparing paper work, data evaluation and todays exam - 35 minutes  Auburn Bilberry M.D on 03/06/2017 at 2:51 PM  [facility]   Office  [phone]

## 2017-03-06 NOTE — Progress Notes (Signed)
Discharge: Pt d/c from room via wheelchair,wife with the pt. Discharge instructions given to the patient and family members.  No questions from pt, reintegrated to the pt to call or go to the ED for chest discomfort. Pt dressed in street clothes and left with discharge papers and prescriptions in hand. IV d/ced, tele removed and no complaints of pain or discomfort.  Pt encouraged to attend his follow up appt

## 2017-03-06 NOTE — Progress Notes (Signed)
*  PRELIMINARY RESULTS* Echocardiogram 2D Echocardiogram has been performed.  Chad Bailey 03/06/2017, 11:04 AM

## 2017-03-07 LAB — ECHOCARDIOGRAM COMPLETE
Height: 68 in
WEIGHTICAEL: 2590.4 [oz_av]

## 2017-04-17 ENCOUNTER — Emergency Department
Admission: EM | Admit: 2017-04-17 | Discharge: 2017-04-18 | Disposition: A | Discharge status: Home / Self Care | Payer: Medicare Other | Attending: Emergency Medicine | Admitting: Emergency Medicine

## 2017-04-17 ENCOUNTER — Encounter: Payer: Self-pay | Admitting: Emergency Medicine

## 2017-04-17 ENCOUNTER — Emergency Department: Payer: Medicare Other

## 2017-04-17 DIAGNOSIS — S12401A Unspecified nondisplaced fracture of fifth cervical vertebra, initial encounter for closed fracture: Secondary | ICD-10-CM | POA: Diagnosis not present

## 2017-04-17 DIAGNOSIS — E119 Type 2 diabetes mellitus without complications: Secondary | ICD-10-CM | POA: Insufficient documentation

## 2017-04-17 DIAGNOSIS — S12601A Unspecified nondisplaced fracture of seventh cervical vertebra, initial encounter for closed fracture: Secondary | ICD-10-CM | POA: Insufficient documentation

## 2017-04-17 DIAGNOSIS — Z7901 Long term (current) use of anticoagulants: Secondary | ICD-10-CM | POA: Insufficient documentation

## 2017-04-17 DIAGNOSIS — W01190A Fall on same level from slipping, tripping and stumbling with subsequent striking against furniture, initial encounter: Secondary | ICD-10-CM | POA: Insufficient documentation

## 2017-04-17 DIAGNOSIS — Z7984 Long term (current) use of oral hypoglycemic drugs: Secondary | ICD-10-CM | POA: Insufficient documentation

## 2017-04-17 DIAGNOSIS — Y9301 Activity, walking, marching and hiking: Secondary | ICD-10-CM | POA: Insufficient documentation

## 2017-04-17 DIAGNOSIS — S12201A Unspecified nondisplaced fracture of third cervical vertebra, initial encounter for closed fracture: Secondary | ICD-10-CM | POA: Diagnosis not present

## 2017-04-17 DIAGNOSIS — S12301A Unspecified nondisplaced fracture of fourth cervical vertebra, initial encounter for closed fracture: Secondary | ICD-10-CM | POA: Diagnosis not present

## 2017-04-17 DIAGNOSIS — Z79899 Other long term (current) drug therapy: Secondary | ICD-10-CM | POA: Diagnosis not present

## 2017-04-17 DIAGNOSIS — S129XXA Fracture of neck, unspecified, initial encounter: Secondary | ICD-10-CM

## 2017-04-17 DIAGNOSIS — S0181XA Laceration without foreign body of other part of head, initial encounter: Secondary | ICD-10-CM | POA: Diagnosis not present

## 2017-04-17 DIAGNOSIS — Z794 Long term (current) use of insulin: Secondary | ICD-10-CM | POA: Insufficient documentation

## 2017-04-17 DIAGNOSIS — Z87891 Personal history of nicotine dependence: Secondary | ICD-10-CM | POA: Diagnosis not present

## 2017-04-17 DIAGNOSIS — S0990XA Unspecified injury of head, initial encounter: Secondary | ICD-10-CM | POA: Insufficient documentation

## 2017-04-17 DIAGNOSIS — Y929 Unspecified place or not applicable: Secondary | ICD-10-CM | POA: Diagnosis not present

## 2017-04-17 DIAGNOSIS — S12501A Unspecified nondisplaced fracture of sixth cervical vertebra, initial encounter for closed fracture: Secondary | ICD-10-CM | POA: Diagnosis not present

## 2017-04-17 DIAGNOSIS — Y999 Unspecified external cause status: Secondary | ICD-10-CM | POA: Diagnosis not present

## 2017-04-17 DIAGNOSIS — S199XXA Unspecified injury of neck, initial encounter: Secondary | ICD-10-CM | POA: Diagnosis present

## 2017-04-17 DIAGNOSIS — I10 Essential (primary) hypertension: Secondary | ICD-10-CM | POA: Diagnosis not present

## 2017-04-17 LAB — GLUCOSE, CAPILLARY: Glucose-Capillary: 192 mg/dL — ABNORMAL HIGH (ref 65–99)

## 2017-04-17 MED ORDER — LIDOCAINE HCL (PF) 1 % IJ SOLN
5.0000 mL | Freq: Once | INTRAMUSCULAR | Status: DC
  Filled 2017-04-17 (×2): qty 5

## 2017-04-17 MED ORDER — MORPHINE SULFATE (PF) 2 MG/ML IV SOLN
2.0000 mg | Freq: Once | INTRAVENOUS | Status: AC
  Administered 2017-04-17: 2 mg via INTRAVENOUS
  Filled 2017-04-17: qty 1

## 2017-04-17 MED ORDER — OXYCODONE-ACETAMINOPHEN 5-325 MG PO TABS: 1.0000 | ORAL_TABLET | Freq: Four times a day (QID) | ORAL | 0 refills | Status: DC | PRN

## 2017-04-17 NOTE — ED Triage Notes (Signed)
Patient is Blind  Patient was transferring from the walker to a wheelchair and lost his balance striking his head on the kitchen table. Arrives c/o neck and upper back pain with lac to head (bleeding controlled)  Patient on pradaxa

## 2017-04-17 NOTE — Discharge Instructions (Signed)
Please seek medical attention for any high fevers, chest pain, shortness of breath, change in behavior, persistent vomiting, bloody stool or any other new or concerning symptoms.  

## 2017-04-17 NOTE — ED Notes (Signed)
Pt informed that his collar is coming and family and pt aware of delay.

## 2017-04-17 NOTE — ED Notes (Signed)
Pt wet in bed. Bed and pt changed by this RN and Alissa EDT.

## 2017-04-17 NOTE — ED Provider Notes (Addendum)
[facility] Emergency Department Provider Note  ____________________________________________   I have reviewed the triage vital signs and the nursing notes.   HISTORY  Chief Complaint Fall   History limited by: Not Limited   HPI Chad Bailey is a 83 y.o. male who presents to the emergency department today because of concerns for fall. The patient was attempting to transfer when he lost balance. He thinks he hit his head against some furniture. Did suffer laceration to his head. Since that time he has been complaining of left shoulder, upper back pain. It has been constant. He had similar pain after a fall roughly 1 month ago.    Past Medical History:  Diagnosis Date  . Blindness   . Dysuria   . Fracture of left ankle   . Giant cell arteritis (HCC)   . gouty arthritis   . History of tobacco abuse   . Hyperlipidemia   . Hypertension   . Loss of vision 01/2013   left eye  . Non-insulin dependent diabetes mellitus   . PAF (paroxysmal atrial fibrillation) (HCC)    a. on pradaxa; b. CHADS2VASc at least 7 (HTN, age x 2, DM, stroke, vascular disease)  . Retinal artery thrombosis, left 2014  . Thyroid nodule 2014  . Type 2 diabetes mellitus [facility](HCC)     Patient Active Problem List   Diagnosis Date Noted  . Hypoglycemia 10/07/2016  . Shakiness 10/07/2016  . Hypotension 10/07/2016  . Leukocytosis 10/07/2016  . Elevated troponin 10/07/2016  . Hypomagnesemia 10/07/2016  . Altered mental status 10/06/2016  . Benign fibroma of prostate 10/11/2015  . Giant cell arteritis with polymyalgia rheumatica (HCC) 08/31/2015  . Absence of bladder continence 08/28/2015  . Intractable headache 08/23/2015  . Absolute anemia 08/12/2015  . B12 deficiency 07/03/2015  . Glaucoma 06/30/2015  . Peripheral nerve disease 06/30/2015  . Nicotine addiction 06/30/2015  . Vision loss, bilateral 12/19/2014  . Inflammation of uveal tract 12/04/2014  . Preoperative  cardiovascular examination 06/18/2014  . Bradycardia 06/18/2014  . Orthostasis 03/22/2013  . Peripheral vascular disease in diabetes mellitus (HCC) 03/01/2013  . CVA (cerebral vascular accident) (HCC) 03/01/2013  . 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 History of tobacco abuse   . gouty arthritis   . Hyperlipidemia 11/04/2010  . HYPERTENSION, BENIGN 11/04/2010  . Atrial fibrillation (HCC) 11/04/2010    Past Surgical History:  Procedure Laterality Date  . EYE SURGERY     right  . LASIK     bilateral eye surgery  . left ankle   1996   s/p surgery with screws    Prior to Admission medications   Medication Sig Start Date End Date Taking? Authorizing Provider  acetaminophen (TYLENOL) 500 MG tablet Take 500 mg by mouth every 6 (six) hours as needed. Reported on 11/01/2015    [provider]  atorvastatin (LIPITOR) 10 MG tablet Take 10 mg by mouth daily.  09/30/16   [provider]  Brinzolamide-Brimonidine (SIMBRINZA) 1-0.2 % SUSP Apply to eye 2 (two) times daily.    [provider]  Calcium Carbonate-Vitamin D 600-400 MG-UNIT tablet Take 1 tablet by mouth 2 (two) times daily.  08/30/15 03/05/18  [provider]  cyanocobalamin (,VITAMIN B-12,) 1000 MCG/ML injection Inject 1,000 mcg into the muscle every 30 (thirty) days. 09/25/16 09/20/17  [provider]  ferrous sulfate 325 (65 FE) MG tablet Take 1 tablet by mouth 2 (two) times daily.  [provider]  finasteride (PROSCAR) 5 MG tablet TAKE ONE TABLET BY MOUTH ONCE DAILY 05/29/15   Sherlene Shams, MD  glucose blood test strip Check blood sugar twice daily as instructed. Pt has Contour Next EZ. Dx Z61.096 08/16/14   Sherlene Shams, MD  HUMALOG 100 UNIT/ML injection Inject 7 Units into the skin 3 (three) times daily with meals.  08/30/15   [provider]  insulin glargine (LANTUS) 100 UNIT/ML injection  Inject 0.1 mLs (10 Units total) into the skin at bedtime. Please taper off insulin Lantus by 2 units if you are tapering off prednisone, following your blood glucose levels,  Thank you Patient taking differently: Inject 13 Units into the skin at bedtime. Please taper off insulin Lantus by 2 units if you are tapering off prednisone, following your blood glucose levels,  Thank you 10/07/16   Katharina Caper, MD  latanoprost (XALATAN) 0.005 % ophthalmic solution Place 1 drop into both eyes at bedtime. 03/06/17   Auburn Bilberry, MD  metFORMIN (GLUCOPHAGE) 1000 MG tablet TAKE ONE TABLET BY MOUTH TWICE DAILY WITH MEALS Patient not taking: Reported on 03/05/2017 05/29/15   Sherlene Shams, MD  MICROLET LANCETS MISC USE ONE LANCET TWICE DAILY  Dx code:  E11.9 05/30/15   Sherlene Shams, MD  Multiple Vitamin (MULTIVITAMIN) capsule Take 1 capsule by mouth daily.    [provider]  NOVOFINE 30G X 8 MM MISC USE ONE NEEDLE EACH TIME INSULIN IS INJECTED INTO THE SKIN 05/29/15   Sherlene Shams, MD  PRADAXA 150 MG CAPS capsule TAKE ONE CAPSULE BY MOUTH EVERY 12 HOURS 05/28/15   Gollan, Tollie Pizza, MD  predniSONE (DELTASONE) 1 MG tablet Take 3 tablets (3 mg total) by mouth daily with breakfast. Patient taking differently: Take 1 mg by mouth daily with breakfast.  10/08/16   Katharina Caper, MD  tamsulosin (FLOMAX) 0.4 MG CAPS capsule TAKE ONE CAPSULE BY MOUTH ONCE DAILY 08/21/15   Sherlene Shams, MD  timolol (BETIMOL) 0.5 % ophthalmic solution Place 1 drop into both eyes 2 (two) times daily.     [provider]  travoprost, benzalkonium, (TRAVATAN) 0.004 % ophthalmic solution Place 1 drop into both eyes at bedtime.     [provider]  vitamin C (ASCORBIC ACID) 500 MG tablet Take 1 tablet by mouth daily.    [provider]    Allergies Patient has no known allergies.  Family History  Problem Relation Age of Onset  . Heart disease Mother   . Tuberculosis Mother   . Lung cancer Father    . Cancer Maternal Grandmother        bowel  . Kidney disease Neg Hx   . Prostate cancer Neg Hx     Social History Social History  Substance Use Topics  . Smoking status: Former Smoker    Packs/day: 6.00    Years: 56.00    Types: Cigarettes, Pipe    Quit date: 10/05/2002  . Smokeless tobacco: Former Neurosurgeon    Types: Chew     Comment: smoked 6 pipes/day;smoked cigarettes 1/2 PPD ,quit chewing tobacco in 2004  . Alcohol use No    Review of Systems Constitutional: No fever/chills Eyes: Blind ENT: No sore throat. Cardiovascular: Denies chest pain. Respiratory: Denies shortness of breath. Gastrointestinal: No abdominal pain.  No nausea, no vomiting.  No diarrhea.   Genitourinary: Negative for dysuria. Musculoskeletal: Left shoulder pain, upper back pain. Skin: Laceration to right eyebrow Neurological: Negative for  headaches, focal weakness or numbness.  ____________________________________________   PHYSICAL EXAM:  VITAL SIGNS: ED Triage Vitals  Enc Vitals Group     BP --      Pulse Rate 04/17/17 1602 (!) 55     Resp 04/17/17 1602 18     Temp 04/17/17 1602 98.3 F (36.8 C)     Temp Source 04/17/17 1602 Oral     SpO2 04/17/17 1602 96 %     Weight 04/17/17 1603 165 lb (74.8 kg)     Height 04/17/17 1603 6' (1.829 m)     Head Circumference --      Peak Flow --      Pain Score 04/17/17 1600 2   Constitutional: Alert and oriented. Well appearing and in no distress. Eyes: Conjunctivae are normal.  ENT   Head: Normocephalic. Roughly 1 cm laceration to right eyebrow.   Nose: No congestion/rhinnorhea.   Mouth/Throat: Mucous membranes are moist.   Neck: No stridor. Hematological/Lymphatic/Immunilogical: No cervical lymphadenopathy. Cardiovascular: Normal rate, regular rhythm.  No murmurs, rubs, or gallops. Respiratory: Normal respiratory effort without tachypnea nor retractions. Breath sounds are clear and equal bilaterally. No  wheezes/rales/rhonchi. Gastrointestinal: Soft and non tender. No rebound. No guarding.  Genitourinary: Deferred Musculoskeletal: Normal range of motion in all extremities. No lower extremity edema. Neurologic:  Normal speech and language. No gross focal neurologic deficits are appreciated.  Skin:  Laceration to right eyebrow. Psychiatric: Mood and affect are normal. Speech and behavior are normal. Patient exhibits appropriate insight and judgment.  ____________________________________________    LABS (pertinent positives/negatives)  Labs Reviewed  GLUCOSE, CAPILLARY - Abnormal; Notable for the following:       Result Value   Glucose-Capillary 192 (*)    All other components within normal limits     ____________________________________________   EKG  None  ____________________________________________    RADIOLOGY  CT scan IMPRESSION:  Generalized atrophy with small vessel chronic ischemic changes of  deep cerebral white matter.    Old RIGHT frontal infarct.    No acute intracranial abnormalities.    Multiple cervical spine fractures as above including superior  endplate C7 vertebral body with minimal anterior height loss,  superior articular facets of C7 and C5, inferior articular facets of  C4 and C6, and RIGHT spinous process C3.   I, Therma Lasure, personally discussed these CT  images and results by phone with the on-call radiologist and used this discussion as part of my medical decision making.   Left shoulder IMPRESSION:  Osseous demineralization with chronic LEFT rotator cuff tear.    No acute bony abnormalities.    Aortic Atherosclerosis (ICD10-I70.0).    Thoracic IMPRESSION:  Osseous demineralization with degenerative disc disease changes  thoracic spine.    No acute thoracic spine abnormalities.    Please refer to CT cervical spine report for description of  visualize cervical spine fractures.     ____________________________________________   PROCEDURES  Procedures  _________ LACERATION REPAIR Performed by: Phineas Semen Authorized by: Phineas Semen Consent: Verbal consent obtained. Risks and benefits: risks, benefits and alternatives were discussed Consent given by: patient Patient identity confirmed: provided demographic data Prepped and Draped in normal sterile fashion Wound explored  Laceration Location: right forehead  Laceration Length: 1 cm  No Foreign Bodies seen or palpated  Anesthesia: local infiltration  Local anesthetic: lidocaine 1% without epinephrine  Anesthetic total: 2 ml  Irrigation method: syringe Amount of cleaning: standard  Skin closure: 5-0 vicryl rapide  Number of sutures: 3  Technique: simple interrupted  Patient tolerance: Patient tolerated the procedure well with no immediate complications. ___________________________________   INITIAL IMPRESSION / ASSESSMENT AND PLAN / ED COURSE  Pertinent labs & imaging results that were available during my care of the patient were reviewed by me and considered in my medical decision making (see chart for details).  Patient presented to the emergency department today after a fall. Patient suffered a small laceration to his right eyebrow which was repaired. Additionally patient had CT scans done of the head and neck. Head CT was negative however neck CT showed multiple vertebral fractures. Discussed with [facility] spine surgeon on call. They were able to review the imaging. They did not feel that the patient needed emergent transfer. Did recommend being placed in a hard cervical collar and following up in clinic. Patient is neurologically intact. Will discharge with pain prescription.  ____________________________________________   FINAL CLINICAL IMPRESSION(S) / ED DIAGNOSES  Final diagnoses:  Closed fracture of cervical vertebra, unspecified cervical vertebral level, initial encounter  (HCC)  Laceration of forehead, initial encounter     Note: This dictation was prepared with Dragon dictation. Any transcriptional errors that result from this process are unintentional     Phineas Semen, MD 04/17/17 1610    Phineas Semen, MD 04/17/17 [phone]

## 2017-04-17 NOTE — ED Notes (Signed)
C-collar applied

## 2018-06-02 IMAGING — CT CT HEAD W/O CM
4 of 6 series · 15 of 47 positions shown, 17 images · non-contrast
Comparison: 03/05/2017

CLINICAL DATA: Lost his balance wall transferring from the walker
to wheelchair, fell striking his head on the kitchen table, neck and
upper back pain, laceration above RIGHT eye, history type II
diabetes mellitus, atrial fibrillation, hypertension, smoking

EXAM:
CT HEAD WITHOUT CONTRAST
CT CERVICAL SPINE WITHOUT CONTRAST
TECHNIQUE: Multidetector CT imaging of the head and cervical spine was
performed following the standard protocol without intravenous
contrast. Multiplanar CT image reconstructions of the cervical spine
were also generated.

[Series 2: head wo · axial · 0.47mm/px · z∈[-118,-13]mm · 6 of 31 slices shown, 8 images]
[im 5/31  brain]
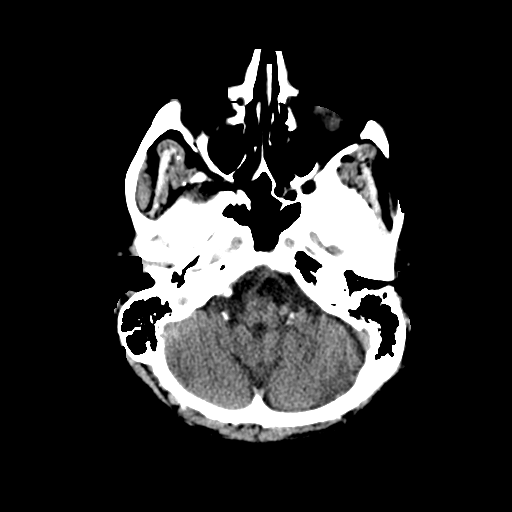
[im 5/31  bone]
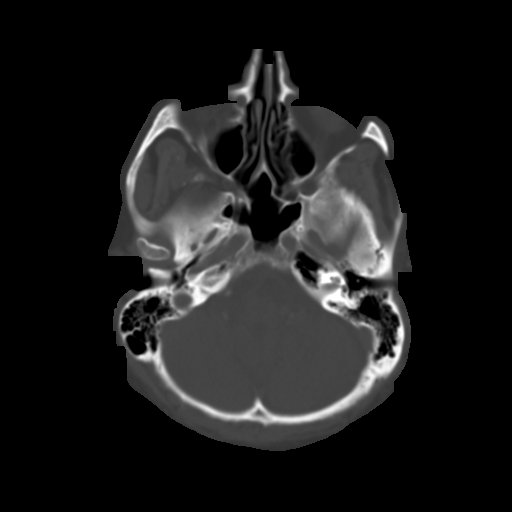
[im 9/31  brain]
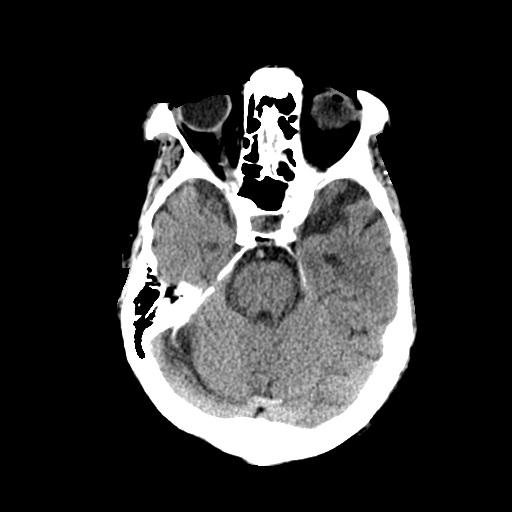
[im 13/31  brain]
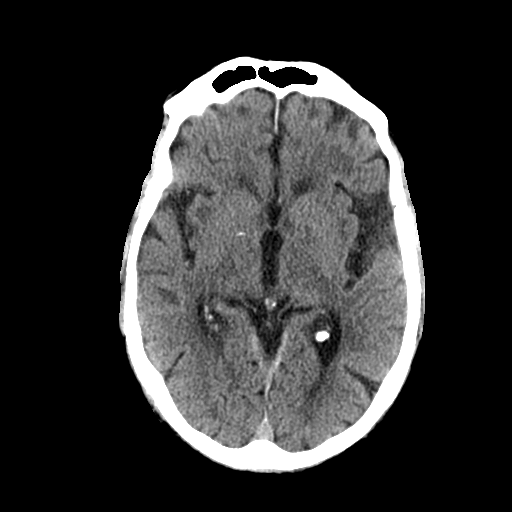
[im 18/31  brain]
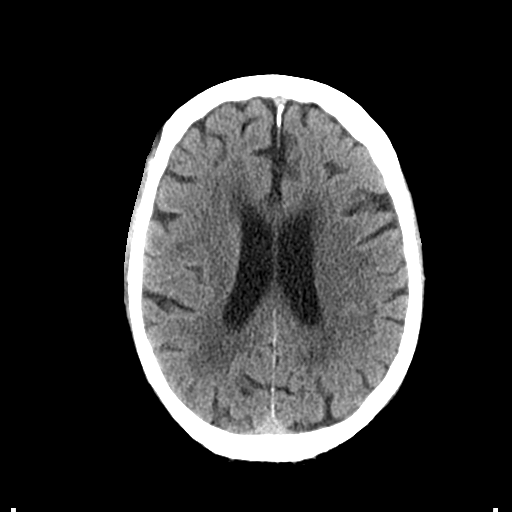
[im 22/31  brain]
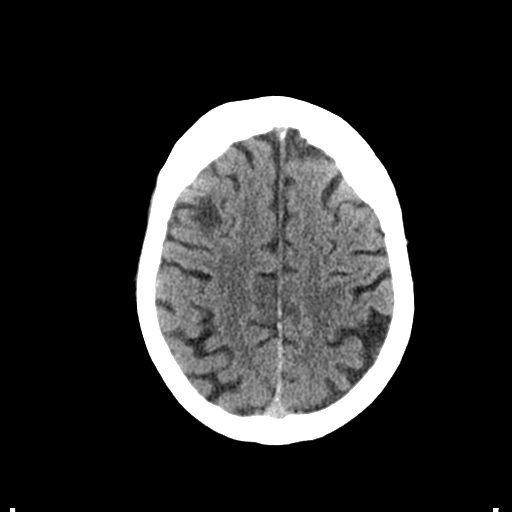
[im 22/31  bone]
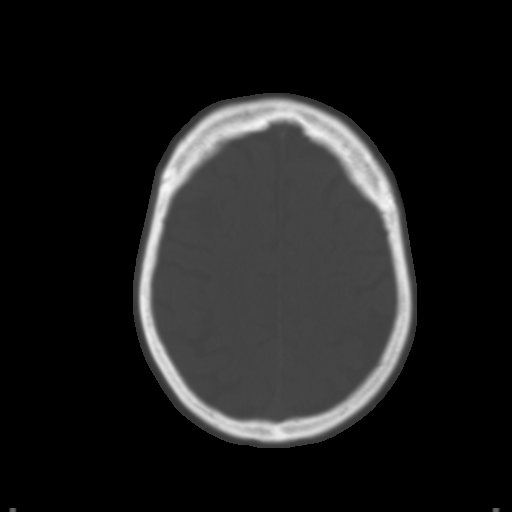
[im 26/31  brain]
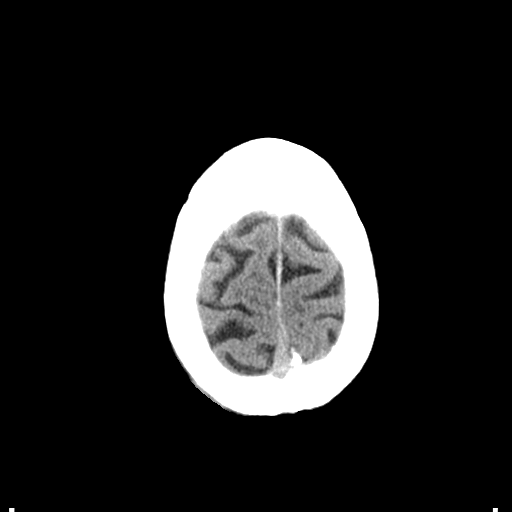

[Series 4: coronal soft tissue · coronal · 0.30mm/px · 3 of 67 slices shown]
[im 4/67  brain]
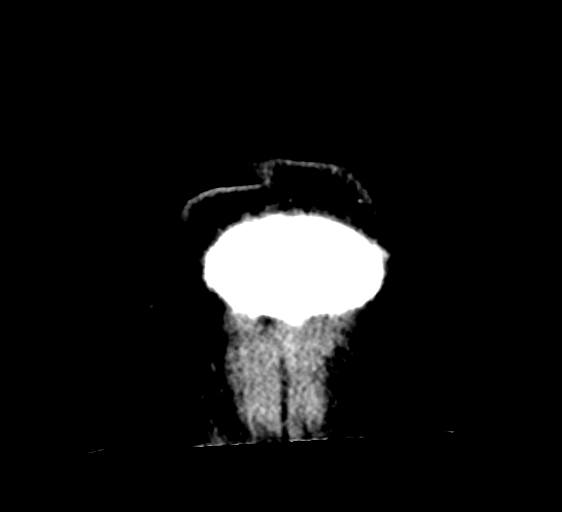
[im 7/67  brain]
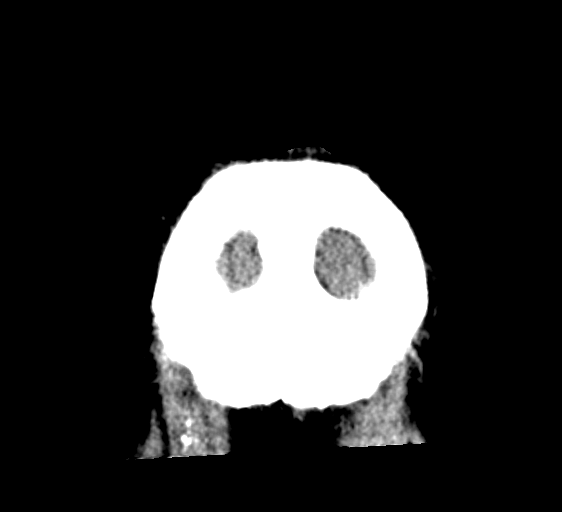
[im 10/67  brain]
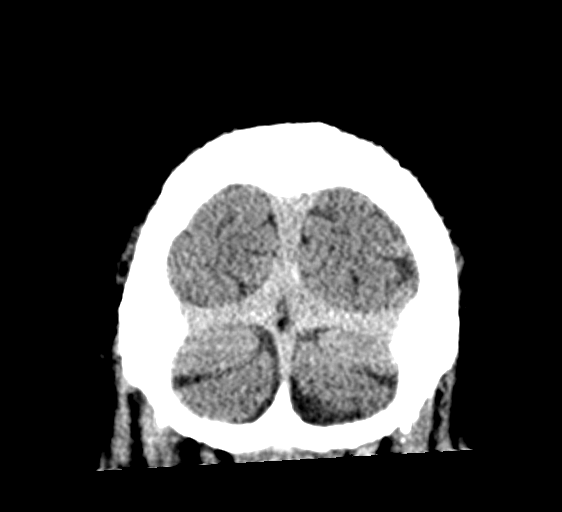

[Series 5: sagittal soft tissue · sagittal · 0.30mm/px · 2 of 55 slices shown]
[im 19/55  brain]
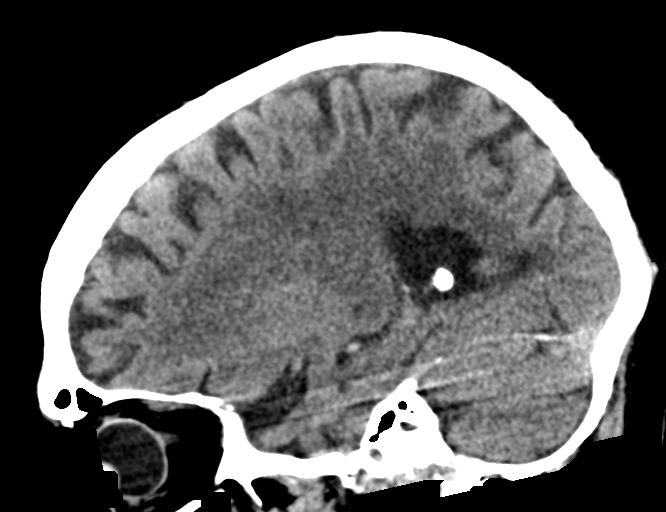
[im 37/55  brain]
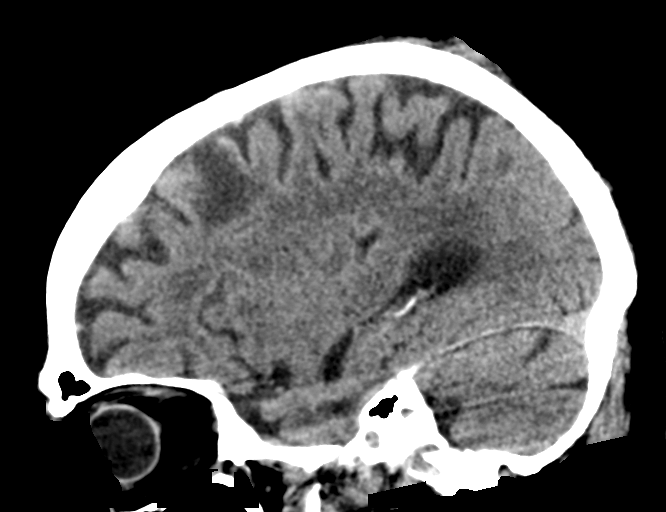

[Series 7: c spine soft · axial · 0.40mm/px · z∈[-276,-220]mm · 4 of 84 slices shown]
[im 8/84  brain]
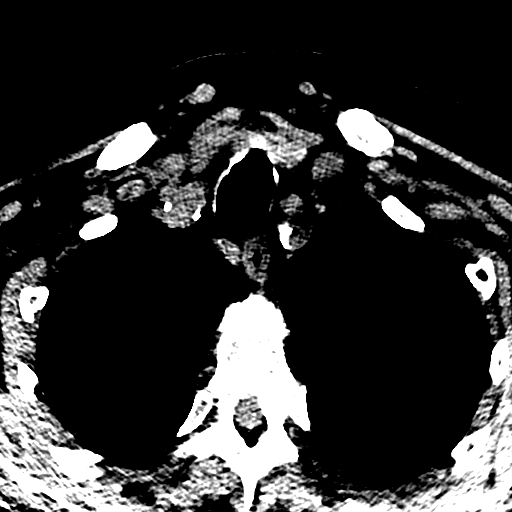
[im 16/84  brain]
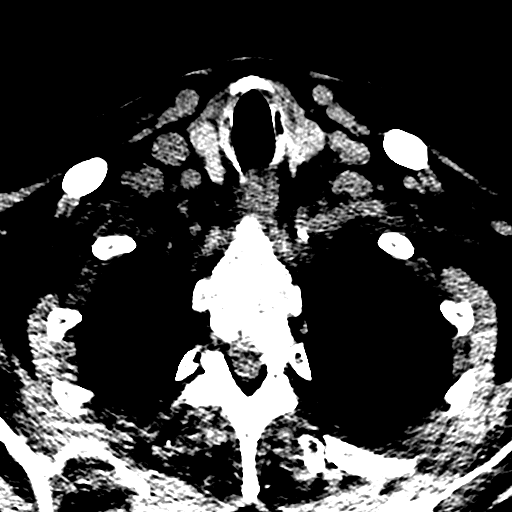
[im 28/84  brain]
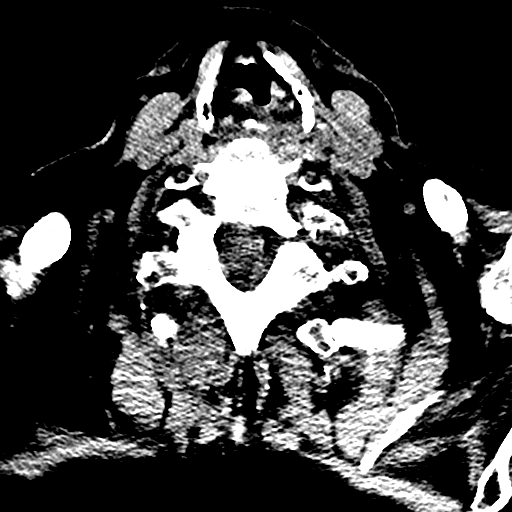
[im 36/84  brain]
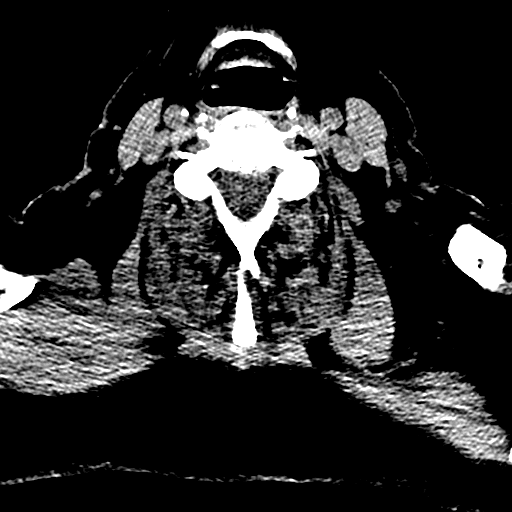

[15 of 47 positions shown; findings below may reference images not displayed]

FINDINGS: CT HEAD FINDINGS

Brain: Generalized atrophy. Normal ventricular morphology. No
midline shift or mass effect. Small vessel chronic ischemic changes
of deep cerebral white matter. Old RIGHT frontal infarct. No
intracranial hemorrhage, mass lesion, evidence of acute infarction,
or extra-axial fluid collection.

Vascular: Atherosclerotic calcification of internal carotid and
vertebral arteries at skullbase

Skull: Intact

Sinuses/Orbits: Clearing

Other: N/A

CT CERVICAL SPINE FINDINGS

Alignment: Normal

Skull base and vertebrae: Visualized skullbase intact. Multiple
cervical spine fractures identified:

Superior endplate compression fracture C7 vertebral body with
minimal anterior height loss.

Superior articular facet fracture LEFT C7, with fragment displaced
into C6-C7 bony foramen.

Superior articular facet fracture LEFT C5, nondisplaced.

Inferior articular facet fracture LEFT C4, nondisplaced.

Fracture at tip of inferior articular facet LEFT C6, nondisplaced.

Fracture at tip of RIGHT spinous process C3, nondisplaced

Soft tissues and spinal canal: Prevertebral soft tissues normal
thickness. Atherosclerotic calcifications at carotid bifurcations
bilaterally and in the proximal great vessels.

Disc levels:  Acute calcified disc.  No gross disc herniation.

Upper chest: Lung apices clear

Other: N/A
IMPRESSION: Generalized atrophy with small vessel chronic ischemic changes of
deep cerebral white matter.

Old RIGHT frontal infarct.

No acute intracranial abnormalities.

Multiple cervical spine fractures as above including superior
endplate C7 vertebral body with minimal anterior height loss,
superior articular facets of C7 and C5, inferior articular facets of
C4 and C6, and RIGHT spinous process C3.

Findings called to Dr. Josue Israel on 04/17/2017 at 7081 hours.

## 2020-04-29 ENCOUNTER — Emergency Department: Payer: Medicare Other

## 2020-04-29 ENCOUNTER — Inpatient Hospital Stay
Admission: EM | Admit: 2020-04-29 | Discharge: 2020-05-03 | DRG: 641 | Disposition: A | Discharge status: Skilled Nursing Facility | Payer: Medicare Other | Attending: Internal Medicine | Admitting: Internal Medicine

## 2020-04-29 ENCOUNTER — Other Ambulatory Visit: Payer: Self-pay

## 2020-04-29 ENCOUNTER — Encounter: Payer: Self-pay | Admitting: Emergency Medicine

## 2020-04-29 DIAGNOSIS — I480 Paroxysmal atrial fibrillation: Secondary | ICD-10-CM | POA: Diagnosis not present

## 2020-04-29 DIAGNOSIS — Z831 Family history of other infectious and parasitic diseases: Secondary | ICD-10-CM

## 2020-04-29 DIAGNOSIS — I951 Orthostatic hypotension: Secondary | ICD-10-CM | POA: Diagnosis present

## 2020-04-29 DIAGNOSIS — R778 Other specified abnormalities of plasma proteins: Secondary | ICD-10-CM | POA: Diagnosis not present

## 2020-04-29 DIAGNOSIS — G9340 Encephalopathy, unspecified: Secondary | ICD-10-CM | POA: Diagnosis present

## 2020-04-29 DIAGNOSIS — R4781 Slurred speech: Secondary | ICD-10-CM | POA: Diagnosis present

## 2020-04-29 DIAGNOSIS — Z794 Long term (current) use of insulin: Secondary | ICD-10-CM

## 2020-04-29 DIAGNOSIS — I4821 Permanent atrial fibrillation: Secondary | ICD-10-CM | POA: Diagnosis present

## 2020-04-29 DIAGNOSIS — Z79899 Other long term (current) drug therapy: Secondary | ICD-10-CM

## 2020-04-29 DIAGNOSIS — E785 Hyperlipidemia, unspecified: Secondary | ICD-10-CM

## 2020-04-29 DIAGNOSIS — D6859 Other primary thrombophilia: Secondary | ICD-10-CM | POA: Diagnosis present

## 2020-04-29 DIAGNOSIS — R296 Repeated falls: Secondary | ICD-10-CM | POA: Diagnosis present

## 2020-04-29 DIAGNOSIS — E43 Unspecified severe protein-calorie malnutrition: Secondary | ICD-10-CM | POA: Diagnosis not present

## 2020-04-29 DIAGNOSIS — E1142 Type 2 diabetes mellitus with diabetic polyneuropathy: Secondary | ICD-10-CM | POA: Diagnosis present

## 2020-04-29 DIAGNOSIS — I10 Essential (primary) hypertension: Secondary | ICD-10-CM | POA: Diagnosis not present

## 2020-04-29 DIAGNOSIS — R627 Adult failure to thrive: Secondary | ICD-10-CM | POA: Diagnosis present

## 2020-04-29 DIAGNOSIS — E860 Dehydration: Secondary | ICD-10-CM | POA: Diagnosis present

## 2020-04-29 DIAGNOSIS — H409 Unspecified glaucoma: Secondary | ICD-10-CM | POA: Diagnosis present

## 2020-04-29 DIAGNOSIS — Z7952 Long term (current) use of systemic steroids: Secondary | ICD-10-CM

## 2020-04-29 DIAGNOSIS — Z20822 Contact with and (suspected) exposure to covid-19: Secondary | ICD-10-CM | POA: Diagnosis present

## 2020-04-29 DIAGNOSIS — R531 Weakness: Secondary | ICD-10-CM | POA: Diagnosis not present

## 2020-04-29 DIAGNOSIS — Z8249 Family history of ischemic heart disease and other diseases of the circulatory system: Secondary | ICD-10-CM

## 2020-04-29 DIAGNOSIS — R5381 Other malaise: Secondary | ICD-10-CM | POA: Diagnosis present

## 2020-04-29 DIAGNOSIS — Z801 Family history of malignant neoplasm of trachea, bronchus and lung: Secondary | ICD-10-CM

## 2020-04-29 DIAGNOSIS — E871 Hypo-osmolality and hyponatremia: Secondary | ICD-10-CM | POA: Diagnosis present

## 2020-04-29 DIAGNOSIS — Z7189 Other specified counseling: Secondary | ICD-10-CM

## 2020-04-29 DIAGNOSIS — M315 Giant cell arteritis with polymyalgia rheumatica: Secondary | ICD-10-CM

## 2020-04-29 DIAGNOSIS — H548 Legal blindness, as defined in USA: Secondary | ICD-10-CM | POA: Diagnosis present

## 2020-04-29 DIAGNOSIS — D72829 Elevated white blood cell count, unspecified: Secondary | ICD-10-CM

## 2020-04-29 DIAGNOSIS — Z7901 Long term (current) use of anticoagulants: Secondary | ICD-10-CM

## 2020-04-29 DIAGNOSIS — R7989 Other specified abnormal findings of blood chemistry: Secondary | ICD-10-CM | POA: Diagnosis present

## 2020-04-29 DIAGNOSIS — R338 Other retention of urine: Secondary | ICD-10-CM | POA: Diagnosis present

## 2020-04-29 DIAGNOSIS — Z515 Encounter for palliative care: Secondary | ICD-10-CM

## 2020-04-29 DIAGNOSIS — H543 Unqualified visual loss, both eyes: Secondary | ICD-10-CM

## 2020-04-29 DIAGNOSIS — E113299 Type 2 diabetes mellitus with mild nonproliferative diabetic retinopathy without macular edema, unspecified eye: Secondary | ICD-10-CM

## 2020-04-29 DIAGNOSIS — E11319 Type 2 diabetes mellitus with unspecified diabetic retinopathy without macular edema: Secondary | ICD-10-CM | POA: Diagnosis present

## 2020-04-29 DIAGNOSIS — W19XXXA Unspecified fall, initial encounter: Secondary | ICD-10-CM | POA: Diagnosis present

## 2020-04-29 DIAGNOSIS — N179 Acute kidney failure, unspecified: Secondary | ICD-10-CM | POA: Diagnosis not present

## 2020-04-29 DIAGNOSIS — Z6821 Body mass index (BMI) 21.0-21.9, adult: Secondary | ICD-10-CM

## 2020-04-29 DIAGNOSIS — Z8673 Personal history of transient ischemic attack (TIA), and cerebral infarction without residual deficits: Secondary | ICD-10-CM

## 2020-04-29 DIAGNOSIS — F17220 Nicotine dependence, chewing tobacco, uncomplicated: Secondary | ICD-10-CM | POA: Diagnosis present

## 2020-04-29 DIAGNOSIS — N401 Enlarged prostate with lower urinary tract symptoms: Secondary | ICD-10-CM | POA: Diagnosis present

## 2020-04-29 DIAGNOSIS — I4891 Unspecified atrial fibrillation: Secondary | ICD-10-CM | POA: Diagnosis present

## 2020-04-29 LAB — COMPREHENSIVE METABOLIC PANEL
ALT: 19 U/L (ref 0–44)
AST: 27 U/L (ref 15–41)
Albumin: 3.7 g/dL (ref 3.5–5.0)
Alkaline Phosphatase: 59 U/L (ref 38–126)
Anion gap: 7 (ref 5–15)
BUN: 25 mg/dL — ABNORMAL HIGH (ref 8–23)
CO2: 26 mmol/L (ref 22–32)
Calcium: 8.9 mg/dL (ref 8.9–10.3)
Chloride: 100 mmol/L (ref 98–111)
Creatinine, Ser: 0.81 mg/dL (ref 0.61–1.24)
GFR calc Af Amer: >60 mL/min (ref >60)
GFR calc non Af Amer: >60 mL/min (ref >60)
Glucose, Bld: 271 mg/dL — ABNORMAL HIGH (ref 70–99)
Potassium: 4.2 mmol/L (ref 3.5–5.1)
Sodium: 133 mmol/L — ABNORMAL LOW (ref 135–145)
Total Bilirubin: 1.6 mg/dL — ABNORMAL HIGH (ref 0.3–1.2)
Total Protein: 7.3 g/dL (ref 6.5–8.1)

## 2020-04-29 LAB — CBC
HCT: 42.6 % (ref 39.0–52.0)
Hemoglobin: 14.7 g/dL (ref 13.0–17.0)
MCH: 30.8 pg (ref 26.0–34.0)
MCHC: 34.5 g/dL (ref 30.0–36.0)
MCV: 89.3 fL (ref 80.0–100.0)
Platelets: 304 10*3/uL (ref 150–400)
RBC: 4.77 MIL/uL (ref 4.22–5.81)
RDW: 13.3 % (ref 11.5–15.5)
WBC: 17.8 10*3/uL — ABNORMAL HIGH (ref 4.0–10.5)
nRBC: 0 % (ref 0.0–0.2)

## 2020-04-29 LAB — DIFFERENTIAL
Abs Immature Granulocytes: 0.15 10*3/uL — ABNORMAL HIGH (ref 0.00–0.07)
Basophils Absolute: 0.1 10*3/uL (ref 0.0–0.1)
Basophils Relative: 0 %
Eosinophils Absolute: 0.2 10*3/uL (ref 0.0–0.5)
Eosinophils Relative: 1 %
Immature Granulocytes: 1 %
Lymphocytes Relative: 11 %
Lymphs Abs: 1.9 10*3/uL (ref 0.7–4.0)
Monocytes Absolute: 1.1 10*3/uL — ABNORMAL HIGH (ref 0.1–1.0)
Monocytes Relative: 6 %
Neutro Abs: 14.5 10*3/uL — ABNORMAL HIGH (ref 1.7–7.7)
Neutrophils Relative %: 81 %

## 2020-04-29 LAB — APTT: aPTT: 40 seconds — ABNORMAL HIGH (ref 24–36)

## 2020-04-29 LAB — LACTIC ACID, PLASMA: Lactic Acid, Venous: 2 mmol/L (ref 0.5–1.9)

## 2020-04-29 LAB — TROPONIN I (HIGH SENSITIVITY): Troponin I (High Sensitivity): 21 ng/L — ABNORMAL HIGH (ref <18)

## 2020-04-29 LAB — PROTIME-INR
INR: 1.4 — ABNORMAL HIGH (ref 0.8–1.2)
Prothrombin Time: 17 seconds — ABNORMAL HIGH (ref 11.4–15.2)

## 2020-04-29 LAB — PHOSPHORUS: Phosphorus: 2.7 mg/dL (ref 2.5–4.6)

## 2020-04-29 LAB — GLUCOSE, CAPILLARY: Glucose-Capillary: 271 mg/dL — ABNORMAL HIGH (ref 70–99)

## 2020-04-29 MED ORDER — VANCOMYCIN HCL 1750 MG/350ML IV SOLN
1750.0000 mg | Freq: Once | INTRAVENOUS | Status: AC
  Administered 2020-04-30: 1750 mg via INTRAVENOUS
  Filled 2020-04-29: qty 350

## 2020-04-29 MED ORDER — PIPERACILLIN-TAZOBACTAM 3.375 G IVPB
3.3750 g | Freq: Once | INTRAVENOUS | Status: AC
  Administered 2020-04-30: 3.375 g via INTRAVENOUS
  Filled 2020-04-29: qty 50

## 2020-04-29 MED ORDER — INSULIN ASPART 100 UNIT/ML ~~LOC~~ SOLN: 0.0000 [IU] | SUBCUTANEOUS | Status: DC

## 2020-04-29 NOTE — ED Notes (Signed)
Patient transported to CT at this time. 

## 2020-04-29 NOTE — ED Triage Notes (Signed)
Brought by family for altered mental status with recent fall.  Patient is blind.

## 2020-04-29 NOTE — ED Provider Notes (Signed)
[facility] Emergency Department Provider Note ____________________________________________   First MD Initiated Contact with Patient 04/29/20 2306     (approximate)  I have reviewed the triage vital signs and the nursing notes.   HISTORY  Chief Complaint No chief complaint on file.  Level 5 caveat: History present illness limited due to altered mental status  HPI Chad Bailey is a 86 y.o. male with PMH as noted below who presents with altered mental status and weakness after a fall yesterday.  The patient's son reports that the patient fell yesterday evening at home.  He states that the patient got up and then just fell, without any apparent prodrome.  He hit his head but did not fully lose consciousness.  Since then, the patient has appeared somewhat confused and also very unsteady, weak, and unable to walk which is not his baseline.  He was complaining of some low back pain although this seems to have improved.  The patient denies other acute complaints.  Past Medical History:  Diagnosis Date  . Blindness   . Dysuria   . Fracture of left ankle   . Giant cell arteritis (HCC)   . gouty arthritis   . History of tobacco abuse   . Hyperlipidemia   . Hypertension   . Loss of vision 01/2013   left eye  . Non-insulin dependent diabetes mellitus   . PAF (paroxysmal atrial fibrillation) (HCC)    a. on pradaxa; b. CHADS2VASc at least 7 (HTN, age x 2, DM, stroke, vascular disease)  . Retinal artery thrombosis, left 2014  . Thyroid nodule 2014  . Type 2 diabetes mellitus [facility](HCC)     Patient Active Problem List   Diagnosis Date Noted  . Hypoglycemia 10/07/2016  . Shakiness 10/07/2016  . Hypotension 10/07/2016  . Leukocytosis 10/07/2016  . Elevated troponin 10/07/2016  . Hypomagnesemia 10/07/2016  . Altered mental status 10/06/2016  . Benign fibroma of prostate 10/11/2015  . Giant cell arteritis with polymyalgia rheumatica (HCC) 08/31/2015  . Absence  of bladder continence 08/28/2015  . Intractable headache 08/23/2015  . Absolute anemia 08/12/2015  . B12 deficiency 07/03/2015  . Glaucoma 06/30/2015  . Peripheral nerve disease 06/30/2015  . Nicotine addiction 06/30/2015  . Vision loss, bilateral 12/19/2014  . Inflammation of uveal tract 12/04/2014  . Preoperative cardiovascular examination 06/18/2014  . Bradycardia 06/18/2014  . Orthostasis 03/22/2013  . Peripheral vascular disease in diabetes mellitus (HCC) 03/01/2013  . CVA (cerebral vascular accident) (HCC) 03/01/2013  . 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 History of tobacco abuse   . gouty arthritis   . Hyperlipidemia 11/04/2010  . HYPERTENSION, BENIGN 11/04/2010  . Atrial fibrillation (HCC) 11/04/2010    Past Surgical History:  Procedure Laterality Date  . EYE SURGERY     right  . LASIK     bilateral eye surgery  . left ankle   1996   s/p surgery with screws    Prior to Admission medications   Medication Sig Start Date End Date Taking? Authorizing Provider  acetaminophen (TYLENOL) 500 MG tablet Take 500 mg by mouth every 6 (six) hours as needed. Reported on 11/01/2015    [provider]  atorvastatin (LIPITOR) 10 MG tablet Take 10 mg by mouth daily.  09/30/16   [provider]  Brinzolamide-Brimonidine (SIMBRINZA) 1-0.2 % SUSP Apply to eye 2 (two) times daily.    [provider]  Calcium Carbonate-Vitamin D 600-400 MG-UNIT  tablet Take 1 tablet by mouth 2 (two) times daily.  08/30/15 03/05/18  [provider]  ferrous sulfate 325 (65 FE) MG tablet Take 1 tablet by mouth 2 (two) times daily.    [provider]  finasteride (PROSCAR) 5 MG tablet TAKE ONE TABLET BY MOUTH ONCE DAILY 05/29/15   Sherlene Shams, MD  glucose blood test strip Check blood sugar twice daily as instructed. Pt has Contour Next EZ. Dx I34.742 08/16/14   Sherlene Shams, MD  HUMALOG 100  UNIT/ML injection Inject 7 Units into the skin 3 (three) times daily with meals.  08/30/15   [provider]  insulin glargine (LANTUS) 100 UNIT/ML injection Inject 0.1 mLs (10 Units total) into the skin at bedtime. Please taper off insulin Lantus by 2 units if you are tapering off prednisone, following your blood glucose levels,  Thank you Patient taking differently: Inject 13 Units into the skin at bedtime. Please taper off insulin Lantus by 2 units if you are tapering off prednisone, following your blood glucose levels,  Thank you 10/07/16   Katharina Caper, MD  latanoprost (XALATAN) 0.005 % ophthalmic solution Place 1 drop into both eyes at bedtime. 03/06/17   Auburn Bilberry, MD  metFORMIN (GLUCOPHAGE) 1000 MG tablet TAKE ONE TABLET BY MOUTH TWICE DAILY WITH MEALS Patient not taking: Reported on 03/05/2017 05/29/15   Sherlene Shams, MD  MICROLET LANCETS MISC USE ONE LANCET TWICE DAILY  Dx code:  E11.9 05/30/15   Sherlene Shams, MD  Multiple Vitamin (MULTIVITAMIN) capsule Take 1 capsule by mouth daily.    [provider]  NOVOFINE 30G X 8 MM MISC USE ONE NEEDLE EACH TIME INSULIN IS INJECTED INTO THE SKIN 05/29/15   Sherlene Shams, MD  oxyCODONE-acetaminophen (ROXICET) 5-325 MG tablet Take 1 tablet by mouth every 6 (six) hours as needed for severe pain. 04/17/17   Phineas Semen, MD  PRADAXA 150 MG CAPS capsule TAKE ONE CAPSULE BY MOUTH EVERY 12 HOURS 05/28/15   Antonieta Iba, MD  predniSONE (DELTASONE) 1 MG tablet Take 3 tablets (3 mg total) by mouth daily with breakfast. Patient taking differently: Take 1 mg by mouth daily with breakfast.  10/08/16   Katharina Caper, MD  tamsulosin (FLOMAX) 0.4 MG CAPS capsule TAKE ONE CAPSULE BY MOUTH ONCE DAILY 08/21/15   Sherlene Shams, MD  timolol (BETIMOL) 0.5 % ophthalmic solution Place 1 drop into both eyes 2 (two) times daily.     [provider]  travoprost, benzalkonium, (TRAVATAN) 0.004 % ophthalmic solution Place 1 drop into both  eyes at bedtime.     [provider]  vitamin C (ASCORBIC ACID) 500 MG tablet Take 1 tablet by mouth daily.    [provider]    Allergies Patient has no known allergies.  Family History  Problem Relation Age of Onset  . Heart disease Mother   . Tuberculosis Mother   . Lung cancer Father   . Cancer Maternal Grandmother        bowel  . Kidney disease Neg Hx   . Prostate cancer Neg Hx     Social History Social History   Tobacco Use  . Smoking status: Former Smoker    Packs/day: 6.00    Years: 56.00    Pack years: 336.00    Types: Cigarettes, Pipe    Quit date: 10/05/2002    Years since quitting: 17.5  . Smokeless tobacco: Former Neurosurgeon    Types: Chew  .  Tobacco comment: smoked 6 pipes/day;smoked cigarettes 1/2 PPD ,quit chewing tobacco in 2004  Substance Use Topics  . Alcohol use: No  . Drug use: No    Review of Systems Level 5 caveat: Review of systems limited due to possible altered mental status Constitutional: No fever. Cardiovascular: Denies chest pain. Respiratory: Denies shortness of breath. Gastrointestinal: No vomiting. Musculoskeletal: Positive for back pain. Skin: Negative for rash. Neurological: Negative for headache.   ____________________________________________   PHYSICAL EXAM:  VITAL SIGNS: ED Triage Vitals  Enc Vitals Group     BP 04/29/20 1402 (!) 126/51     Pulse Rate 04/29/20 1402 76     Resp 04/29/20 1402 16     Temp 04/29/20 1402 99.1 F (37.3 C)     Temp Source 04/29/20 1402 Oral     SpO2 04/29/20 1402 96 %     Weight 04/29/20 1403 160 lb (72.6 kg)     Height 04/29/20 1403 5\' 11"  (1.803 m)     Head Circumference --      Peak Flow --      Pain Score 04/29/20 1403 0     Pain Loc --      Pain Edu? --      Excl. in GC? --     Constitutional: Alert and oriented.  Weak appearing but in no acute distress. Eyes: Conjunctivae are normal.  EOMI.  PERRLA. Head: Atraumatic. Nose: No  congestion/rhinnorhea. Mouth/Throat: Mucous membranes are dry.   Neck: Normal range of motion.  No midline cervical spinal tenderness. Cardiovascular: Normal rate, regular rhythm. Grossly normal heart sounds.  Good peripheral circulation. Respiratory: Normal respiratory effort.  No retractions. Lungs CTAB. Gastrointestinal: Soft and nontender. No distention.  Genitourinary: No flank tenderness. Musculoskeletal: No lower extremity edema.  Extremities warm and well perfused.  Mild lumbar midline spinal tenderness with no step-off or crepitus. Neurologic:  Normal speech and language.  Motor intact in all extremities, but with diffuse weakness. Skin:  Skin is warm and dry. No rash noted. Psychiatric: Calm and cooperative.  ____________________________________________   LABS (all labs ordered are listed, but only abnormal results are displayed)  Labs Reviewed  CBC - Abnormal; Notable for the following components:      Result Value   WBC 17.8 (*)    All other components within normal limits  DIFFERENTIAL - Abnormal; Notable for the following components:   Neutro Abs 14.5 (*)    Monocytes Absolute 1.1 (*)    Abs Immature Granulocytes 0.15 (*)    All other components within normal limits  COMPREHENSIVE METABOLIC PANEL - Abnormal; Notable for the following components:   Sodium 133 (*)    Glucose, Bld 271 (*)    BUN 25 (*)    Total Bilirubin 1.6 (*)    All other components within normal limits  PROTIME-INR - Abnormal; Notable for the following components:   Prothrombin Time 17.0 (*)    INR 1.4 (*)    All other components within normal limits  APTT - Abnormal; Notable for the following components:   aPTT 40 (*)    All other components within normal limits  GLUCOSE, CAPILLARY - Abnormal; Notable for the following components:   Glucose-Capillary 271 (*)    All other components within normal limits  LACTIC ACID, PLASMA - Abnormal; Notable for the following components:   Lactic Acid,  Venous 2.0 (*)    All other components within normal limits  TROPONIN I (HIGH SENSITIVITY) - Abnormal; Notable for the following components:  Troponin I (High Sensitivity) 21 (*)    All other components within normal limits  SARS CORONAVIRUS 2 BY RT PCR (HOSPITAL ORDER, PERFORMED IN [place] [facility])  URINALYSIS, COMPLETE (UACMP) WITH MICROSCOPIC  LACTIC ACID, PLASMA  SEDIMENTATION RATE  CK  MAGNESIUM  PHOSPHORUS  TROPONIN I (HIGH SENSITIVITY)   ____________________________________________  EKG  ED ECG REPORT I, Chad Bailey, the attending physician, personally viewed and interpreted this ECG.  Date: 04/29/2020 EKG Time: 1411 Rate: 64 Rhythm: Atrial fibrillation QRS Axis: normal Intervals: normal ST/T Wave abnormalities: normal Narrative Interpretation: no evidence of acute ischemia  ____________________________________________  RADIOLOGY  CT head: No ICH or other acute abnormality CT lumbar spine: No acute fracture CXR: No focal infiltrate or edema ____________________________________________   PROCEDURES  Procedure(s) performed: No  Procedures  Critical Care performed: No ____________________________________________   INITIAL IMPRESSION / ASSESSMENT AND PLAN / ED COURSE  Pertinent labs & imaging results that were available during my care of the patient were reviewed by me and considered in my medical decision making (see chart for details).  86 year old male with PMH as noted above presents with worsening generalized weakness, inability to ambulate, and some confusion since a fall yesterday.  From the description, it does not appear that it was a mechanical fall, and may have been more of a near syncopal event.  I reviewed the past medical records in epic.  The patient was most recently seen in the ED in 2018 for a fall in which he lost his balance and had a laceration to the head.  He was admitted earlier in 2018 due to syncope related to  bradycardia.  On exam today, the patient is overall quite weak appearing.  His vital signs are normal except for hypertension and borderline elevated temperature.  He is moving all of his extremities, but appears generally weak and has difficulty holding his arms up or lifting his legs more than a few inches off of the wheelchair.  He has some lumbar midline tenderness with no other obvious trauma.  The exam is otherwise as described above.  CT head was obtained from triage and shows no acute traumatic findings.  Lab work-up is significant for an elevated WBC count and borderline elevated troponin.  Given the WBC count and temperature, I have concern for UTI or other infection.  Differential also includes dehydration or other metabolic etiology, versus possible acute cardiac or CNS cause.  Patient has no focal neurologic findings to suggest acute stroke.  I have added on a urinalysis, lactic acid, chest x-ray, and lumbar spine x-rays for further evaluation.  I anticipate admission given the acute change in the patient's functional status and the concern for possible infection.  ----------------------------------------- 11:26 PM on 04/29/2020 -----------------------------------------  Lactic is slightly elevated.  Chest x-ray shows no acute findings.  Lumbar spine x-ray is also negative.  Urinalysis is still pending.  I discussed the case with Dr. Adela Glimpse from the hospitalist service for admission.  ____________________________________________   FINAL CLINICAL IMPRESSION(S) / ED DIAGNOSES  Final diagnoses:  Weakness      NEW MEDICATIONS STARTED DURING THIS VISIT:  New Prescriptions   No medications on file     Note:  This document was prepared using Dragon voice recognition software and may include unintentional dictation errors.    Chad Bucy, MD 04/29/20 2326

## 2020-04-29 NOTE — ED Triage Notes (Addendum)
Fell at 730p last night and hit head.  No loc.  After the fall they put him in wheel chair as he could not walk.  Patient says his back initially hurt, but not now.    Per family he has slurred speech--patient is able to talk to me without difficulty/

## 2020-04-29 NOTE — H&P (Signed)
Chad Bailey [name]:[id number] DOB: [birth year] DOA: 04/29/2020     PCP: Mickey Farber, MD   Outpatient Specialists:   CARDS:  Dr.Callwood    Patient arrived to ER on 04/29/20 at 1253 Referred by Attending Chad Bucy, MD   Patient coming from: home Lives  With family    Chief Complaint:  Recent fall HPI: Chad Bailey is a 86 y.o. male with medical history significant of blindness, giant cell arteritis, gout, HLD, HTN, diabetes mellitus type 2, per paroxysmal atrial fibrillation, retinal artery thrombosis,    Presented with confusion and generalized weakness According to patient's family he fell at 7:30pm last night 04/28/2020 and hit his head . He was standing inside his walker and had no control of his legs and just went down. Family tried to feed him he stayed in his whealchair and would not get up. This AM he could not get up to go to the bathroom. Family was concerned he had, mild CVA. Family not noticed any asymmetric weakness. But he slumps over to the right on his chair but that is not new. no LOC at the time but afterwards he could not really walk.  Initially patient was received porting back pain but not at this time anymore he has been progressively getting somewhat confused and having slurred speech intermittently by family Patient being unsteady and weak Per family pt stood up and fell back down, although they did not notice LOc  Infectious risk factors:  Reports none    Has  Been fully vaccinated against COVID    Initial COVID TEST   in house  PCR testing  Pending  No results found for: SARSCOV2NAA   Regarding pertinent Chronic problems:     Hyperlipidemia - on statins Lipitor Lipid Panel      Component Value Date/Time   CHOL 117 08/01/2014 1201   CHOL 183 08/19/2012 0845   TRIG 53.0 08/01/2014 1201   HDL 49.20 08/01/2014 1201   HDL 49 08/19/2012 0845   CHOLHDL 2 08/01/2014 1201   VLDL 10.6 08/01/2014 1201   LDLCALC 57 08/01/2014 1201    LDLCALC 120 (H) 08/19/2012 0845   LDLDIRECT 53.0 12/16/2012 1151   LABVLDL 14 08/19/2012 0845     DM 2 -  Lab Results  Component Value Date   HGBA1C 6.5 (H) 10/06/2016   on insulin, PO meds      Hx of CVA - *with/out residual deficits on Aspirin 81 mg, 325, Plavix   A. Fib -  - CHA2DS2 vas score  6   current  on anticoagulation with Pradaxa          -  Rate controled:     BPH - on Flomax, Proscar   While in ER: WBC 17 Trop 21 CT head and lumbar spine nonacute chest x-ray unremarkable Unsure if it was possibly near syncopal event Of note patient is on chronic prednisone  Hospitalist was called for admission for generalized fatigue  The following Work up has been ordered so far:  Orders Placed This Encounter  Procedures  . SARS Coronavirus 2 by RT PCR (hospital order, performed in [facility] hospital lab) Nasopharyngeal Nasopharyngeal Swab  . CT HEAD WO CONTRAST  . CT Lumbar Spine Wo Contrast  . DG Chest 1 View  . CBC  . Differential  . Comprehensive metabolic panel  . Protime-INR  . APTT  . Glucose, capillary  . Urinalysis, Complete w Microscopic  . Lactic acid, plasma  . Consult  to hospitalist  5901  . ED EKG   Following Medications were ordered in ER: Medications - No data to display      Consult Orders  (From admission, onward)         Start     Ordered   04/29/20 2257  Consult to hospitalist  5901  Once       Comments: 5901  Provider:  (Not yet assigned)  Question Answer Comment  Place call to: Hospitalist   Reason for Consult Admit   Diagnosis/Clinical Info for Consult: AMS, weakness      04/29/20 2257          Significant initial  Findings: Abnormal Labs Reviewed  CBC - Abnormal; Notable for the following components:      Result Value   WBC 17.8 (*)    All other components within normal limits  DIFFERENTIAL - Abnormal; Notable for the following components:   Neutro Abs 14.5 (*)    Monocytes Absolute 1.1 (*)    Abs Immature  Granulocytes 0.15 (*)    All other components within normal limits  COMPREHENSIVE METABOLIC PANEL - Abnormal; Notable for the following components:   Sodium 133 (*)    Glucose, Bld 271 (*)    BUN 25 (*)    Total Bilirubin 1.6 (*)    All other components within normal limits  PROTIME-INR - Abnormal; Notable for the following components:   Prothrombin Time 17.0 (*)    INR 1.4 (*)    All other components within normal limits  APTT - Abnormal; Notable for the following components:   aPTT 40 (*)    All other components within normal limits  GLUCOSE, CAPILLARY - Abnormal; Notable for the following components:   Glucose-Capillary 271 (*)    All other components within normal limits  LACTIC ACID, PLASMA - Abnormal; Notable for the following components:   Lactic Acid, Venous 2.0 (*)    All other components within normal limits  TROPONIN I (HIGH SENSITIVITY) - Abnormal; Notable for the following components:   Troponin I (High Sensitivity) 21 (*)    All other components within normal limits    Otherwise labs showing:    Recent Labs  Lab 04/29/20 1407  NA 133*  K 4.2  CO2 26  GLUCOSE 271*  BUN 25*  CREATININE 0.81  CALCIUM 8.9    Cr   Up from baseline see below Lab Results  Component Value Date   CREATININE 0.81 04/29/2020   CREATININE 0.67 03/06/2017   CREATININE 0.79 03/05/2017    Recent Labs  Lab 04/29/20 1407  AST 27  ALT 19  ALKPHOS 59  BILITOT 1.6*  PROT 7.3  ALBUMIN 3.7   Lab Results  Component Value Date   CALCIUM 8.9 04/29/2020      WBC      Component Value Date/Time   WBC 17.8 (H) 04/29/2020 1407   ANC    Component Value Date/Time   NEUTROABS 14.5 (H) 04/29/2020 1407   NEUTROABS 3.5 09/24/2014 1204   ALC No components found for: LYMPHAB    Plt: Lab Results  Component Value Date   PLT 304 04/29/2020     Lactic Acid, Venous    Component Value Date/Time   LATICACIDVEN 2.0 (HH) 04/29/2020 2241    HG/HCT stable,      Component Value  Date/Time   HGB 14.7 04/29/2020 1407   HGB 11.6 (L) 08/23/2015 1427   HCT 42.6 04/29/2020 1407   HCT 35.5 (L) 08/23/2015  1427    No results for input(s): LIPASE, AMYLASE in the last 168 hours. No results for input(s): AMMONIA in the last 168 hours.    Troponin  21 Cardiac Panel (last 3 results) No results for input(s): CKTOTAL, CKMB, TROPONINI, RELINDX in the last 72 hours.     ECG: Ordered Personally reviewed by me showing: HR : 64 Rhythm:   A.fib   no evidence of ischemic changes QTC 437   DM  labs:  HbA1C: No results for input(s): HGBA1C in the last 8760 hours.     CBG (last 3)  Recent Labs    04/29/20 1413  GLUCAP 271*       UA  ordered   Urine analysis:    Component Value Date/Time   COLORURINE YELLOW (A) 03/05/2017 0933   APPEARANCEUR HAZY (A) 03/05/2017 0933   APPEARANCEUR Clear 11/01/2015 1504   LABSPEC 1.013 03/05/2017 0933   PHURINE 5.0 03/05/2017 0933   GLUCOSEU NEGATIVE 03/05/2017 0933   GLUCOSEU NEGATIVE 05/23/2014 1343   HGBUR NEGATIVE 03/05/2017 0933   BILIRUBINUR NEGATIVE 03/05/2017 0933   BILIRUBINUR Negative 11/01/2015 1504   KETONESUR NEGATIVE 03/05/2017 0933   PROTEINUR NEGATIVE 03/05/2017 0933   UROBILINOGEN 0.2 05/23/2014 1343   NITRITE NEGATIVE 03/05/2017 0933   LEUKOCYTESUR NEGATIVE 03/05/2017 0933   LEUKOCYTESUR Negative 11/01/2015 1504      Ordered  CT HEAD   NON acute  CXR -  NON acute Ct lumbar spine  ED Triage Vitals  Enc Vitals Group     BP 04/29/20 1402 (!) 126/51     Pulse Rate 04/29/20 1402 76     Resp 04/29/20 1402 16     Temp 04/29/20 1402 99.1 F (37.3 C)     Temp Source 04/29/20 1402 Oral     SpO2 04/29/20 1402 96 %     Weight 04/29/20 1403 160 lb (72.6 kg)     Height 04/29/20 1403  (1.803 m)     Head Circumference --      Peak Flow --      Pain Score 04/29/20 1403 0     Pain Loc --      Pain Edu? --      Excl. in GC? --   TMAX(24)@       Latest  Blood pressure (!) 157/60, pulse 70,  temperature 99.3 F (37.4 C), temperature source Oral, resp. rate 17, height  (1.803 m), weight 72.6 kg, SpO2 97 %.    Review of Systems:    Pertinent positives include: confusion slurred speech  gait abnormality,  Constitutional:  No weight loss, night sweats, Fevers, chills, fatigue, weight loss  HEENT:  No headaches, Difficulty swallowing,Tooth/dental problems,Sore throat,  No sneezing, itching, ear ache, nasal congestion, post nasal drip,  Cardio-vascular:  No chest pain, Orthopnea, PND, anasarca, dizziness, palpitations.no Bilateral lower extremity swelling  GI:  No heartburn, indigestion, abdominal pain, nausea, vomiting, diarrhea, change in bowel habits, loss of appetite, melena, blood in stool, hematemesis Resp:  no shortness of breath at rest. No dyspnea on exertion, No excess mucus, no productive cough, No non-productive cough, No coughing up of blood.No change in color of mucus.No wheezing. Skin:  no rash or lesions. No jaundice GU:  no dysuria, change in color of urine, no urgency or frequency. No straining to urinate.  No flank pain.  Musculoskeletal:  No joint pain or no joint swelling. No decreased range of motion. No back pain.  Psych:  No change in mood or  affect. No depression or anxiety. No memory loss.  Neuro: no localizing neurological complaints, no tingling, no weakness, no double vision,  no   All systems reviewed and apart from HOPI all are negative  Past Medical History:   Past Medical History:  Diagnosis Date  . Blindness   . Dysuria   . Fracture of left ankle   . Giant cell arteritis (HCC)   . gouty arthritis   . History of tobacco abuse   . Hyperlipidemia   . Hypertension   . Loss of vision 01/2013   left eye  . Non-insulin dependent diabetes mellitus   . PAF (paroxysmal atrial fibrillation) (HCC)    a. on pradaxa; b. CHADS2VASc at least 7 (HTN, age x 2, DM, stroke, vascular disease)  . Retinal artery thrombosis, left 2014  . Thyroid  nodule 2014  . Type 2 diabetes mellitus (HCC)        Past Surgical History:  Procedure Laterality Date  . EYE SURGERY     right  . LASIK     bilateral eye surgery  . left ankle   1996   s/p surgery with screws    Social History:  Ambulatory , walker      reports that he quit smoking about 17 years ago. His smoking use included cigarettes and pipe. He has a 336.00 pack-year smoking history. He has quit using smokeless tobacco.  His smokeless tobacco use included chew. He reports that he does not drink alcohol and does not use drugs.     Family History:   Family History  Problem Relation Age of Onset  . Heart disease Mother   . Tuberculosis Mother   . Lung cancer Father   . Cancer Maternal Grandmother        bowel  . Kidney disease Neg Hx   . Prostate cancer Neg Hx     Allergies: No Known Allergies   Prior to Admission medications   Medication Sig Start Date End Date Taking? Authorizing Provider  acetaminophen (TYLENOL) 500 MG tablet Take 500 mg by mouth every 6 (six) hours as needed. Reported on 11/01/2015    [provider]  atorvastatin (LIPITOR) 10 MG tablet Take 10 mg by mouth daily.  09/30/16   [provider]  Brinzolamide-Brimonidine (SIMBRINZA) 1-0.2 % SUSP Apply to eye 2 (two) times daily.    [provider]  Calcium Carbonate-Vitamin D 600-400 MG-UNIT tablet Take 1 tablet by mouth 2 (two) times daily.  08/30/15 03/05/18  [provider]  ferrous sulfate 325 (65 FE) MG tablet Take 1 tablet by mouth 2 (two) times daily.    [provider]  finasteride (PROSCAR) 5 MG tablet TAKE ONE TABLET BY MOUTH ONCE DAILY 05/29/15   Sherlene Shams, MD  glucose blood test strip Check blood sugar twice daily as instructed. Pt has Contour Next EZ. Dx Z61.096 08/16/14   Sherlene Shams, MD  HUMALOG 100 UNIT/ML injection Inject 7 Units into the skin 3 (three) times daily with meals.  08/30/15   [provider]  insulin glargine  (LANTUS) 100 UNIT/ML injection Inject 0.1 mLs (10 Units total) into the skin at bedtime. Please taper off insulin Lantus by 2 units if you are tapering off prednisone, following your blood glucose levels,  Thank you Patient taking differently: Inject 13 Units into the skin at bedtime. Please taper off insulin Lantus by 2 units if you are tapering off prednisone, following your blood glucose levels,  Thank you 10/07/16  Katharina Caper, MD  latanoprost (XALATAN) 0.005 % ophthalmic solution Place 1 drop into both eyes at bedtime. 03/06/17   Auburn Bilberry, MD  metFORMIN (GLUCOPHAGE) 1000 MG tablet TAKE ONE TABLET BY MOUTH TWICE DAILY WITH MEALS Patient not taking: Reported on 03/05/2017 05/29/15   Sherlene Shams, MD  MICROLET LANCETS MISC USE ONE LANCET TWICE DAILY  Dx code:  E11.9 05/30/15   Sherlene Shams, MD  Multiple Vitamin (MULTIVITAMIN) capsule Take 1 capsule by mouth daily.    [provider]  NOVOFINE 30G X 8 MM MISC USE ONE NEEDLE EACH TIME INSULIN IS INJECTED INTO THE SKIN 05/29/15   Sherlene Shams, MD  oxyCODONE-acetaminophen (ROXICET) 5-325 MG tablet Take 1 tablet by mouth every 6 (six) hours as needed for severe pain. 04/17/17   Phineas Semen, MD  PRADAXA 150 MG CAPS capsule TAKE ONE CAPSULE BY MOUTH EVERY 12 HOURS 05/28/15   Antonieta Iba, MD  predniSONE (DELTASONE) 1 MG tablet Take 3 tablets (3 mg total) by mouth daily with breakfast. Patient taking differently: Take 1 mg by mouth daily with breakfast.  10/08/16   Katharina Caper, MD  tamsulosin (FLOMAX) 0.4 MG CAPS capsule TAKE ONE CAPSULE BY MOUTH ONCE DAILY 08/21/15   Sherlene Shams, MD  timolol (BETIMOL) 0.5 % ophthalmic solution Place 1 drop into both eyes 2 (two) times daily.     [provider]  travoprost, benzalkonium, (TRAVATAN) 0.004 % ophthalmic solution Place 1 drop into both eyes at bedtime.     [provider]  vitamin C (ASCORBIC ACID) 500 MG tablet Take 1 tablet by mouth daily.    [provider]   Physical Exam: Vitals with BMI 04/29/2020 04/29/2020 04/29/2020  Height - - 5\' 11"   Weight - - 160 lbs  BMI - - 22.33  Systolic 157 155 -  Diastolic 60 61 -  Pulse 70 64 -     1. General:  in No  Acute distress    Chronically ill  -appearing 2. Psychological: Alert and  Oriented 3. Head/ENT:    Dry Mucous Membranes                          Head Non traumatic, neck supple                         Poor Dentition 4. SKIN:  decreased Skin turgor,  Skin clean Dry and intact no rash 5. Heart: Regular rate and rhythm no  Murmur, no Rub or gallop 6. Lungs:   no wheezes or crackles   7. Abdomen: Soft,  non-tender, Non distended  bowel sounds present 8. Lower extremities: no clubbing, cyanosis, no   edema 9. Neurologically Grossly intact, moving all 4 extremities equally   10. MSK: Normal range of motion  All other LABS:     Recent Labs  Lab 04/29/20 1407  WBC 17.8*  NEUTROABS 14.5*  HGB 14.7  HCT 42.6  MCV 89.3  PLT 304     Recent Labs  Lab 04/29/20 1407  NA 133*  K 4.2  CL 100  CO2 26  GLUCOSE 271*  BUN 25*  CREATININE 0.81  CALCIUM 8.9     Recent Labs  Lab 04/29/20 1407  AST 27  ALT 19  ALKPHOS 59  BILITOT 1.6*  PROT 7.3  ALBUMIN 3.7      Cultures:    Component Value Date/Time  SDES BLOOD R FA 10/06/2016 0622   SPECREQUEST  10/06/2016 0622    BOTTLES DRAWN AEROBIC AND ANAEROBIC AER 12ML ANA 11ML   CULT NO GROWTH 5 DAYS 10/06/2016 0622   REPTSTATUS 10/11/2016 FINAL 10/06/2016 0622     Radiological Exams on Admission: DG Chest 1 View  Result Date: 04/29/2020 CLINICAL DATA:  Weakness. Altered mental status. Recent fall. Elevated white blood cell count. EXAM: CHEST  1 VIEW COMPARISON:  Chest radiograph 03/05/2017 FINDINGS: Heart is normal in size. Normal mediastinal contours. No confluent airspace disease. No evidence of large pleural effusion or pneumothorax. Calcified granuloma in the right upper lung. No acute osseous abnormalities are  seen. Chronic high-riding humeral heads consistent with underlying rotator cuff arthropathy. IMPRESSION: No acute chest finding. Electronically Signed   By: Narda RutherfordMelanie  Sanford M.D.   On: 04/29/2020 22:39   CT HEAD WO CONTRAST  Result Date: 04/29/2020 CLINICAL DATA:  Head trauma, minor; fell and hit head. Additional history provided: Difficulty walking since fall, slurred speech. EXAM: CT HEAD WITHOUT CONTRAST TECHNIQUE: Contiguous axial images were obtained from the base of the skull through the vertex without intravenous contrast. COMPARISON:  Head CT examinations 04/17/2017 FINDINGS: Brain: Stable, mild generalized parenchymal atrophy. Stable, moderate ill-defined hypoattenuation within the cerebral white matter which is nonspecific, but consistent with chronic small vessel ischemic disease. 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 There is no acute intracranial hemorrhage. No acute demarcated cortical infarct is identified. No extra-axial fluid collection. No evidence of intracranial mass. No midline shift. Vascular: No hyperdense vessel.  Atherosclerotic calcifications. Skull: Normal. Negative for fracture or focal lesion. Sinuses/Orbits: Visualized orbits show no acute finding. Mild paranasal sinus mucosal thickening, most notably ethmoidal. No significant mastoid effusion. IMPRESSION: No CT evidence of acute intracranial abnormality. 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 Stable mild generalized parenchymal atrophy and moderate cerebral white matter chronic small vessel ischemic changes. Mild paranasal sinus mucosal thickening, most notably ethmoidal. Electronically Signed   By: Jackey LogeKyle  Golden DO   On: 04/29/2020 14:33   CT Lumbar Spine Wo Contrast  Result Date: 04/29/2020 CLINICAL DATA:  86 year old male status post fall yesterday with subsequent low back pain. EXAM: CT LUMBAR SPINE WITHOUT CONTRAST TECHNIQUE: Multidetector CT imaging of  the lumbar spine was performed without intravenous contrast administration. Multiplanar CT image reconstructions were also generated. COMPARISON:  None. FINDINGS: Segmentation: Normal. Alignment: S shaped scoliosis from the thoracolumbar junction to the sacrum (coronal image 42). Mild straightening of lumbar lordosis. Mild retrolisthesis of L2 on L3. Vertebrae: No acute osseous abnormality identified. Intact visible sacrum and SI joints. Paraspinal and other soft tissues: Partially visible opacification at the posterior right costophrenic angle. Superimposed chronic calcific pancreatitis with pancreatic atrophy. Aortoiliac calcified atherosclerosis. Vascular patency is not evaluated in the absence of IV contrast. Negative lumbar paraspinal soft tissues aside from posterior paraspinal muscle atrophy. Disc levels: Widespread lumbar spine degeneration in the setting of scoliosis. Acquired appearing ankylosis at T11-T12 and L3-L4. Moderate to severe degeneration with multifactorial spinal stenosis at the L2-L3 level. Degenerative spinal stenosis also at L3-L4. IMPRESSION: 1. No acute osseous abnormality in the lumbar spine. S-Shaped lumbar scoliosis with advanced spinal degeneration. Multifactorial spinal stenosis at L2-L3 and L3-L4. 2. Partially visible right lung base opacification, attention on the two-view chest radiographs which are pending at this time. 3. Chronic calcific pancreatitis. Aortic Atherosclerosis (ICD10-I70.0). Electronically Signed   By: Odessa FlemingH  Hall M.D.   On: 04/29/2020 22:38    Chart has been reviewed  Assessment/Plan  86 y.o. male with medical history significant of blindness, giant cell arteritis, gout, HLD, HTN, diabetes mellitus type 2, per paroxysmal atrial fibrillation, retinal artery thrombosis, Admitted for acute encephalopathy and fall  Present on Admission: . Acute encephalopathy -   most likely multifactorial secondary to combination of possible infection   mild dehydration  secondary to decreased by mouth intake,    - Will rehydrate   - treat underlining infection   - Hold contributing medications   - if no improvement may need further imaging to evaluate for CNS pathology pathology such as MRI of the brain -ordered  - neurological exam appears to be nonfocal but patient unable to cooperate fully      . Atrial fibrillation (HCC) -           - CHA2DS2 vas score 6 : continue current anticoagulation with pradaxa, no evidence of bleeding given advanced age and fall would need to discuss further risks and benefits of anticoagulation          -  Rate control:  Currently controlled    . Elevated troponin mild will continue to cycle cardiac enzymes and monitor no EKG changes to suggest ischemia  Fall unclear etiology could not rule out presyncopal episode.  Monitor on telemetry obtain echogram cycle cardiac enzymes obtain Dopplers of the neck   . Giant cell arteritis with polymyalgia rheumatica (HCC)- has been off steroids   . Hyperlipidemia stable continue home medication   . HYPERTENSION, BENIGN not on BP meds we will continue to monitor allow permissive hypertension  . Leukocytosis -evaluate for any source of infection chest x-ray unremarkable UA pending   . 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 Vision loss, bilateral chronic stable continue home medications   Other plan as per orders.  DVT prophylaxis:  SCD        Code Status:    Code Status: Prior  Limited code  as per patient  family no CPR no intubation Plan to transition to comfort I had personally discussed CODE STATUS with patient and family    Family Communication:   Family  at  Bedside  plan of care was discussed    with   Son,  Wife,    Disposition Plan:      To home once workup is complete and patient is  stable   Following barriers for discharge:                            Electrolytes corrected                                                                           white count improving able to transition to PO antibiotics                             Will need to be able to tolerate PO  Will likely need home health, home                            EXPECT DC tomorrow                     Would benefit from PT/OT eval prior to DC  Ordered                   Swallow eval - SLP ordered                   Diabetes care coordinator                    Nutrition    consulted                   palliative                    Consults called: none  Admission status:  ED Disposition    ED Disposition Condition Comment   Admit  Hospital Area: [facility] [[id number]]  Level of Care: Med-Surg [16]  Covid Evaluation: Asymptomatic Screening Protocol (No Symptoms)  Diagnosis: Acute encephalopathy [161096]  Admitting Physician: Therisa Doyne [[id number]]  Attending Physician: Therisa Doyne [[id number]]       Obs      Level of care     tele    indefinitely please discontinue once patient no longer qualifies COVID-19 Labs   No results found for: SARSCOV2NAA   Precautions: admitted as asymptomatic screening protocol  PPE: Used by the provider:   N95  eye Goggles,  Gloves    Merriam Brandner 04/30/2020, 12:55 AM    [facility]     after 2 AM please page floor coverage PA If 7AM-7PM, please contact the day team taking care of the patient using [email or web address]   Patient was evaluated in the context of the global COVID-19 pandemic, which necessitated consideration that the patient might be at risk for infection with the SARS-CoV-2 virus that causes COVID-19. Institutional protocols and algorithms that pertain to the evaluation of patients at risk for COVID-19 are in a state of rapid change based on information released by regulatory bodies  including the CDC and federal and state organizations. These policies and algorithms were followed during the patient's care.

## 2020-04-30 ENCOUNTER — Observation Stay: Payer: Medicare Other

## 2020-04-30 DIAGNOSIS — Z7189 Other specified counseling: Secondary | ICD-10-CM | POA: Diagnosis not present

## 2020-04-30 DIAGNOSIS — M315 Giant cell arteritis with polymyalgia rheumatica: Secondary | ICD-10-CM | POA: Diagnosis present

## 2020-04-30 DIAGNOSIS — R627 Adult failure to thrive: Secondary | ICD-10-CM | POA: Diagnosis present

## 2020-04-30 DIAGNOSIS — I4821 Permanent atrial fibrillation: Secondary | ICD-10-CM

## 2020-04-30 DIAGNOSIS — R778 Other specified abnormalities of plasma proteins: Secondary | ICD-10-CM | POA: Diagnosis present

## 2020-04-30 DIAGNOSIS — R338 Other retention of urine: Secondary | ICD-10-CM | POA: Diagnosis present

## 2020-04-30 DIAGNOSIS — R7989 Other specified abnormal findings of blood chemistry: Secondary | ICD-10-CM | POA: Diagnosis present

## 2020-04-30 DIAGNOSIS — E113299 Type 2 diabetes mellitus with mild nonproliferative diabetic retinopathy without macular edema, unspecified eye: Secondary | ICD-10-CM | POA: Diagnosis not present

## 2020-04-30 DIAGNOSIS — I10 Essential (primary) hypertension: Secondary | ICD-10-CM | POA: Diagnosis present

## 2020-04-30 DIAGNOSIS — Z6821 Body mass index (BMI) 21.0-21.9, adult: Secondary | ICD-10-CM | POA: Diagnosis not present

## 2020-04-30 DIAGNOSIS — N179 Acute kidney failure, unspecified: Secondary | ICD-10-CM | POA: Diagnosis not present

## 2020-04-30 DIAGNOSIS — Z20822 Contact with and (suspected) exposure to covid-19: Secondary | ICD-10-CM | POA: Diagnosis present

## 2020-04-30 DIAGNOSIS — N401 Enlarged prostate with lower urinary tract symptoms: Secondary | ICD-10-CM | POA: Diagnosis present

## 2020-04-30 DIAGNOSIS — D6859 Other primary thrombophilia: Secondary | ICD-10-CM | POA: Diagnosis present

## 2020-04-30 DIAGNOSIS — R531 Weakness: Secondary | ICD-10-CM

## 2020-04-30 DIAGNOSIS — Z8673 Personal history of transient ischemic attack (TIA), and cerebral infarction without residual deficits: Secondary | ICD-10-CM | POA: Diagnosis not present

## 2020-04-30 DIAGNOSIS — E43 Unspecified severe protein-calorie malnutrition: Secondary | ICD-10-CM | POA: Diagnosis present

## 2020-04-30 DIAGNOSIS — R4781 Slurred speech: Secondary | ICD-10-CM | POA: Diagnosis present

## 2020-04-30 DIAGNOSIS — I951 Orthostatic hypotension: Secondary | ICD-10-CM | POA: Diagnosis present

## 2020-04-30 DIAGNOSIS — E871 Hypo-osmolality and hyponatremia: Secondary | ICD-10-CM | POA: Diagnosis present

## 2020-04-30 DIAGNOSIS — Z7901 Long term (current) use of anticoagulants: Secondary | ICD-10-CM | POA: Diagnosis not present

## 2020-04-30 DIAGNOSIS — H409 Unspecified glaucoma: Secondary | ICD-10-CM | POA: Diagnosis present

## 2020-04-30 DIAGNOSIS — Z7952 Long term (current) use of systemic steroids: Secondary | ICD-10-CM | POA: Diagnosis not present

## 2020-04-30 DIAGNOSIS — W19XXXA Unspecified fall, initial encounter: Secondary | ICD-10-CM | POA: Diagnosis present

## 2020-04-30 DIAGNOSIS — Z515 Encounter for palliative care: Secondary | ICD-10-CM | POA: Diagnosis not present

## 2020-04-30 DIAGNOSIS — G9340 Encephalopathy, unspecified: Secondary | ICD-10-CM | POA: Diagnosis not present

## 2020-04-30 DIAGNOSIS — D72829 Elevated white blood cell count, unspecified: Secondary | ICD-10-CM | POA: Diagnosis present

## 2020-04-30 DIAGNOSIS — E785 Hyperlipidemia, unspecified: Secondary | ICD-10-CM | POA: Diagnosis present

## 2020-04-30 LAB — HEMOGLOBIN A1C
Hgb A1c MFr Bld: 7.3 % — ABNORMAL HIGH (ref 4.8–5.6)
Hgb A1c MFr Bld: 7.1 % — ABNORMAL HIGH (ref 4.8–5.6)
Mean Plasma Glucose: 162.81 mg/dL
Mean Plasma Glucose: 157 mg/dL

## 2020-04-30 LAB — COMPREHENSIVE METABOLIC PANEL
ALT: 18 U/L (ref 0–44)
AST: 22 U/L (ref 15–41)
Albumin: 3.5 g/dL (ref 3.5–5.0)
Alkaline Phosphatase: 57 U/L (ref 38–126)
Anion gap: 11 (ref 5–15)
BUN: 22 mg/dL (ref 8–23)
CO2: 23 mmol/L (ref 22–32)
Calcium: 8.8 mg/dL — ABNORMAL LOW (ref 8.9–10.3)
Chloride: 100 mmol/L (ref 98–111)
Creatinine, Ser: 0.84 mg/dL (ref 0.61–1.24)
GFR calc Af Amer: >60 mL/min (ref >60)
GFR calc non Af Amer: >60 mL/min (ref >60)
Glucose, Bld: 176 mg/dL — ABNORMAL HIGH (ref 70–99)
Potassium: 3.9 mmol/L (ref 3.5–5.1)
Sodium: 134 mmol/L — ABNORMAL LOW (ref 135–145)
Total Bilirubin: 2.1 mg/dL — ABNORMAL HIGH (ref 0.3–1.2)
Total Protein: 7 g/dL (ref 6.5–8.1)

## 2020-04-30 LAB — TROPONIN I (HIGH SENSITIVITY)
Troponin I (High Sensitivity): 36 ng/L — ABNORMAL HIGH (ref <18)
Troponin I (High Sensitivity): 22 ng/L — ABNORMAL HIGH (ref <18)

## 2020-04-30 LAB — TSH: TSH: 1.331 u[IU]/mL (ref 0.350–4.500)

## 2020-04-30 LAB — GLUCOSE, CAPILLARY
Glucose-Capillary: 156 mg/dL — ABNORMAL HIGH (ref 70–99)
Glucose-Capillary: 189 mg/dL — ABNORMAL HIGH (ref 70–99)
Glucose-Capillary: 213 mg/dL — ABNORMAL HIGH (ref 70–99)
Glucose-Capillary: 225 mg/dL — ABNORMAL HIGH (ref 70–99)
Glucose-Capillary: 241 mg/dL — ABNORMAL HIGH (ref 70–99)
Glucose-Capillary: 250 mg/dL — ABNORMAL HIGH (ref 70–99)
Glucose-Capillary: 254 mg/dL — ABNORMAL HIGH (ref 70–99)

## 2020-04-30 LAB — URINALYSIS, COMPLETE (UACMP) WITH MICROSCOPIC
Bacteria, UA: NONE SEEN
Bilirubin Urine: NEGATIVE
Glucose, UA: >=500 mg/dL — AB
Hgb urine dipstick: NEGATIVE
Ketones, ur: 5 mg/dL — AB
Leukocytes,Ua: NEGATIVE
Nitrite: NEGATIVE
Protein, ur: NEGATIVE mg/dL
Specific Gravity, Urine: 1.02 (ref 1.005–1.030)
pH: 5 (ref 5.0–8.0)

## 2020-04-30 LAB — SEDIMENTATION RATE: Sed Rate: 20 mm/hr (ref 0–20)

## 2020-04-30 LAB — LACTIC ACID, PLASMA
Lactic Acid, Venous: 2 mmol/L (ref 0.5–1.9)
Lactic Acid, Venous: 1.7 mmol/L (ref 0.5–1.9)

## 2020-04-30 LAB — CBC WITH DIFFERENTIAL/PLATELET
Abs Immature Granulocytes: 0.07 10*3/uL (ref 0.00–0.07)
Basophils Absolute: 0.1 10*3/uL (ref 0.0–0.1)
Basophils Relative: 0 %
Eosinophils Absolute: 0.6 10*3/uL — ABNORMAL HIGH (ref 0.0–0.5)
Eosinophils Relative: 5 %
HCT: 40.7 % (ref 39.0–52.0)
Hemoglobin: 14 g/dL (ref 13.0–17.0)
Immature Granulocytes: 1 %
Lymphocytes Relative: 14 %
Lymphs Abs: 1.6 10*3/uL (ref 0.7–4.0)
MCH: 30.7 pg (ref 26.0–34.0)
MCHC: 34.4 g/dL (ref 30.0–36.0)
MCV: 89.3 fL (ref 80.0–100.0)
Monocytes Absolute: 0.9 10*3/uL (ref 0.1–1.0)
Monocytes Relative: 8 %
Neutro Abs: 8.3 10*3/uL — ABNORMAL HIGH (ref 1.7–7.7)
Neutrophils Relative %: 72 %
Platelets: 267 10*3/uL (ref 150–400)
RBC: 4.56 MIL/uL (ref 4.22–5.81)
RDW: 13.2 % (ref 11.5–15.5)
WBC: 11.6 10*3/uL — ABNORMAL HIGH (ref 4.0–10.5)
nRBC: 0 % (ref 0.0–0.2)

## 2020-04-30 LAB — CK: Total CK: 108 U/L (ref 49–397)

## 2020-04-30 LAB — PROCALCITONIN: Procalcitonin: <0.1 ng/mL

## 2020-04-30 LAB — MAGNESIUM
Magnesium: 1.8 mg/dL (ref 1.7–2.4)
Magnesium: 1.6 mg/dL — ABNORMAL LOW (ref 1.7–2.4)

## 2020-04-30 LAB — SARS CORONAVIRUS 2 BY RT PCR (HOSPITAL ORDER, PERFORMED IN ~~LOC~~ HOSPITAL LAB): SARS Coronavirus 2: NEGATIVE

## 2020-04-30 LAB — PHOSPHORUS: Phosphorus: 2.1 mg/dL — ABNORMAL LOW (ref 2.5–4.6)

## 2020-04-30 MED ORDER — HYDROCODONE-ACETAMINOPHEN 5-325 MG PO TABS
1.0000 | ORAL_TABLET | ORAL | Status: DC | PRN
  Administered 2020-05-01: 2 via ORAL
  Filled 2020-04-30: qty 2

## 2020-04-30 MED ORDER — INSULIN GLARGINE 100 UNIT/ML ~~LOC~~ SOLN
13.0000 [IU] | Freq: Every day | SUBCUTANEOUS | Status: DC
  Administered 2020-04-30 – 2020-05-02 (×4): 13 [IU] via SUBCUTANEOUS
  Filled 2020-04-30 (×5): qty 0.13

## 2020-04-30 MED ORDER — DABIGATRAN ETEXILATE MESYLATE 150 MG PO CAPS
150.0000 mg | ORAL_CAPSULE | Freq: Two times a day (BID) | ORAL | Status: DC
  Administered 2020-04-30 – 2020-05-03 (×7): 150 mg via ORAL
  Filled 2020-04-30 (×10): qty 1

## 2020-04-30 MED ORDER — TAMSULOSIN HCL 0.4 MG PO CAPS
0.4000 mg | ORAL_CAPSULE | Freq: Every day | ORAL | Status: DC
  Administered 2020-04-30 – 2020-05-03 (×4): 0.4 mg via ORAL
  Filled 2020-04-30 (×4): qty 1

## 2020-04-30 MED ORDER — SODIUM CHLORIDE 0.9 % IV SOLN
INTRAVENOUS | Status: AC
  Administered 2020-04-30: 1000 mL via INTRAVENOUS

## 2020-04-30 MED ORDER — INSULIN ASPART 100 UNIT/ML ~~LOC~~ SOLN
0.0000 [IU] | SUBCUTANEOUS | Status: DC
  Administered 2020-04-30 (×2): 3 [IU] via SUBCUTANEOUS
  Administered 2020-04-30: 2 [IU] via SUBCUTANEOUS
  Administered 2020-04-30 (×2): 3 [IU] via SUBCUTANEOUS
  Administered 2020-04-30: 5 [IU] via SUBCUTANEOUS
  Administered 2020-04-30: 2 [IU] via SUBCUTANEOUS
  Administered 2020-05-01 (×4): 5 [IU] via SUBCUTANEOUS
  Administered 2020-05-01: 3 [IU] via SUBCUTANEOUS
  Administered 2020-05-02 (×4): 2 [IU] via SUBCUTANEOUS
  Administered 2020-05-02: 3 [IU] via SUBCUTANEOUS
  Administered 2020-05-02: 2 [IU] via SUBCUTANEOUS
  Administered 2020-05-03: 1 [IU] via SUBCUTANEOUS
  Administered 2020-05-03: 2 [IU] via SUBCUTANEOUS
  Filled 2020-04-30 (×20): qty 1

## 2020-04-30 MED ORDER — FINASTERIDE 5 MG PO TABS
5.0000 mg | ORAL_TABLET | Freq: Every day | ORAL | Status: DC
  Administered 2020-04-30 – 2020-05-03 (×4): 5 mg via ORAL
  Filled 2020-04-30 (×5): qty 1

## 2020-04-30 MED ORDER — TIMOLOL MALEATE 0.5 % OP SOLN
1.0000 [drp] | Freq: Two times a day (BID) | OPHTHALMIC | Status: DC
  Administered 2020-04-30 – 2020-05-03 (×7): 1 [drp] via OPHTHALMIC
  Filled 2020-04-30: qty 5

## 2020-04-30 MED ORDER — ONDANSETRON HCL 4 MG PO TABS: 4.0000 mg | ORAL_TABLET | Freq: Four times a day (QID) | ORAL | Status: DC | PRN

## 2020-04-30 MED ORDER — SODIUM CHLORIDE 0.9% FLUSH: 3.0000 mL | Freq: Two times a day (BID) | INTRAVENOUS | Status: DC

## 2020-04-30 MED ORDER — ONDANSETRON HCL 4 MG/2ML IJ SOLN: 4.0000 mg | Freq: Four times a day (QID) | INTRAMUSCULAR | Status: DC | PRN

## 2020-04-30 MED ORDER — SODIUM CHLORIDE 0.9 % IV SOLN
INTRAVENOUS | Status: DC
  Administered 2020-04-30: 1000 mL via INTRAVENOUS

## 2020-04-30 MED ORDER — DOCUSATE SODIUM 100 MG PO CAPS
100.0000 mg | ORAL_CAPSULE | Freq: Two times a day (BID) | ORAL | Status: DC
  Administered 2020-04-30 – 2020-05-03 (×8): 100 mg via ORAL
  Filled 2020-04-30 (×8): qty 1

## 2020-04-30 MED ORDER — ACETAMINOPHEN 650 MG RE SUPP: 650.0000 mg | Freq: Four times a day (QID) | RECTAL | Status: DC | PRN

## 2020-04-30 MED ORDER — HYDRALAZINE HCL 20 MG/ML IJ SOLN
10.0000 mg | Freq: Four times a day (QID) | INTRAMUSCULAR | Status: DC | PRN
  Administered 2020-05-01 (×2): 10 mg via INTRAVENOUS
  Filled 2020-04-30 (×3): qty 1

## 2020-04-30 MED ORDER — LATANOPROST 0.005 % OP SOLN: 1.0000 [drp] | Freq: Every day | OPHTHALMIC | Status: DC

## 2020-04-30 MED ORDER — ATORVASTATIN CALCIUM 10 MG PO TABS
10.0000 mg | ORAL_TABLET | Freq: Every day | ORAL | Status: DC
  Administered 2020-04-30 – 2020-05-03 (×4): 10 mg via ORAL
  Filled 2020-04-30 (×4): qty 1

## 2020-04-30 MED ORDER — TRAVOPROST (BAK FREE) 0.004 % OP SOLN
1.0000 [drp] | Freq: Every day | OPHTHALMIC | Status: DC
  Administered 2020-05-01 – 2020-05-02 (×2): 1 [drp] via OPHTHALMIC
  Filled 2020-04-30: qty 2.5

## 2020-04-30 MED ORDER — ACETAMINOPHEN 325 MG PO TABS: 650.0000 mg | ORAL_TABLET | Freq: Four times a day (QID) | ORAL | Status: DC | PRN

## 2020-04-30 NOTE — Progress Notes (Signed)
PT Cancellation Note  Patient Details Name: Chad Bailey MRN: [id number] DOB: 06-Sep-1930   Cancelled Treatment:    Reason Eval/Treat Not Completed: Patient's level of consciousness Spoke with nursing who reports that pt is very confused, states "Probably not appropriate for PT this morning."  Gave number and requested nurse to call should his mental status improve to where he could participate this date.  PT eval deferred this AM.  Malachi Pro, DPT 04/30/2020, 1:51 PM

## 2020-04-30 NOTE — ED Notes (Signed)
Pt placed back on the bedpan 

## 2020-04-30 NOTE — ED Notes (Signed)
Pt back from ct. Pt resting quietly.

## 2020-04-30 NOTE — ED Notes (Signed)
Pt removed external cath again. Pt cleaned and depends placed. Pt has pulled out his IV. Order for IV team placed.

## 2020-04-30 NOTE — Progress Notes (Addendum)
CODE SEPSIS - PHARMACY COMMUNICATION  **Broad Spectrum Antibiotics should be administered within 1 hour of Sepsis diagnosis**  Time Code Sepsis Called/Page Received: 2326  Antibiotics Ordered: vanc/zosyn  Time of 1st antibiotic administration: 0115  Additional action taken by pharmacy:   If necessary, Name of Provider/Nurse Contacted:    Thomasene Ripple ,PharmD Clinical Pharmacist  04/30/2020  2:01 AM

## 2020-04-30 NOTE — Evaluation (Addendum)
Clinical/Bedside Swallow Evaluation Patient Details  Name: Chad Bailey MRN: [id number] Date of Birth: [birth year]  Today's Date: 04/30/2020 Time: SLP Start Time (ACUTE ONLY): 1400 SLP Stop Time (ACUTE ONLY): 1450 SLP Time Calculation (min) (ACUTE ONLY): 50 min  Past Medical History:  Past Medical History:  Diagnosis Date   Blindness    Dysuria    Fracture of left ankle    Giant cell arteritis (HCC)    gouty arthritis    History of tobacco abuse    Hyperlipidemia    Hypertension    Loss of vision 01/2013   left eye   Non-insulin dependent diabetes mellitus    PAF (paroxysmal atrial fibrillation) (HCC)    a. on pradaxa; b. CHADS2VASc at least 7 (HTN, age x 2, DM, stroke, vascular disease)   Retinal artery thrombosis, left 2014   Thyroid nodule 2014   Type 2 diabetes mellitus (HCC)    Past Surgical History:  Past Surgical History:  Procedure Laterality Date   EYE SURGERY     right   LASIK     bilateral eye surgery   left ankle   1996   s/p surgery with screws   HPI:  Pt is a 86 y.o. male with medical history significant of blindness, giant cell arteritis, gout, HLD, HTN, diabetes mellitus type 2, per paroxysmal atrial fibrillation, retinal artery thrombosis, and MULTIPLE other medical issues.  He admitted to the ED s/p Fall; increasing weakness at home.  MRI: Age-related cerebral atrophy with moderate chronic small vessel ischemic disease, with superimposed small remote right frontal lobe infarct.  CXR: No acute chest finding.  Wife reported pt having "confusion" at home "now that he's gotten older and is blind".  Wife assists w/ all ADLs per her report; w/c use at home.  Assessment / Plan / Recommendation Clinical Impression  Pt appears to present w/ grossly adequate pharyngeal phase swallow w/ No pharyngeal phase dysphagia noted, No neuromuscular deficits noted.However, pt exhibited oral phase deficits c/b lengthy mastication time and decreased awareness  for the bolus/bite size he put in his mouth. Pt is Missing many Dentition which impact mastication effectiveness. W/ min verbal/tactile cues to clear mouth and take smaller bites, pt consumed po trials w/ No overt, clinical s/s of aspiration during po trials. Pt appears at reduced risk for aspiration following general aspiration precautions and w/ Supervision during meals; assistance d/t Vision deficits/Blindness baseline. During po trials, pt required Full positioning Upright for oral intake; education on need to sit UP when eating/drinking given to pt and Wife. He consumed all consistencies w/ no overt coughing, decline in vocal quality, or change in respiratory presentation during/post trials. Oral phase c/b increased oral phase time for bolus management, mastication, and control of bolus propulsion for A-P transfer for swallowing. Oral clearing achieved w/ trial consistencies of Solids given extra Time and Alternating foods/liquids. OM Exam was cursory but appeared [facility] w/ no unilateral weakness noted. Speech Clear. Pt fed self w/ full setup and support d/t Blindness. Pt Wife stated she was cutting foods into "small pieces" at home for pt for ease of eating "now that he's gotten older" -- she indicated increased confusion over recent time(weeks/months). Recommend a Mech Soft consistency diet w/ well-Cut meats, moistened foods; Thin liquids. Recommend general aspiration precautions, Pills WHOLE in Puree for safer, easier swallowing. Supervision during meals to monitor pt's bite sizes and oral clearing d/t Impulsivity and decreased awareness. Education given on Pills in Puree; food consistencies and easy to eat options;  general aspiration precautions to Wife/pt. NSG to reconsult if any new needs arise. NSG agreed.  Recommend f/u w/ Neurology re: formal assessment and education of any potential Cognitive decline that may be progressing w/ pt, per Wife's report. This could help address POC goals; safety at home.   SLP Visit Diagnosis: Dysphagia, oral phase (R13.11) (Cognitive decline)    Aspiration Risk  Mild aspiration risk;Risk for inadequate nutrition/hydration (reduced following precautions)    Diet Recommendation  Mech Soft diet w/ well-Cut meats, gravies added; thin liquids. Monitoring at meals for following aspiration precautions d/t impulsive eating behaviors. Tray setup d/t vision deficits.   Medication Administration: Whole meds with puree (for safer swallowing)    Other  Recommendations Recommended Consults:  (Dietician f/u) Oral Care Recommendations: Oral care BID;Oral care before and after PO;Staff/trained caregiver to provide oral care Other Recommendations:  (n/a)   Follow up Recommendations None      Frequency and Duration  (n/a)   (n/a)       Prognosis Prognosis for Safe Diet Advancement: Fair (-Good) Barriers to Reach Goals: Cognitive deficits;Time post onset;Severity of deficits;Behavior (baseline)      Swallow Study   General Date of Onset: 04/29/20 HPI: Pt is a 86 y.o. male with medical history significant of blindness, giant cell arteritis, gout, HLD, HTN, diabetes mellitus type 2, per paroxysmal atrial fibrillation, retinal artery thrombosis, and MULTIPLE other medical issues.  He admitted to the ED s/p Fall; increasing weakness at home.  MRI: Age-related cerebral atrophy with moderate chronic small vessel ischemic disease, with superimposed small remote right frontal lobe infarct.  CXR: No acute chest finding.  Wife reported pt having "confusion" at home "now that he's gotten older and is blind".  Wife assists w/ all ADLs per her report. Type of Study: Bedside Swallow Evaluation Previous Swallow Assessment: none reported Diet Prior to this Study: Regular;Thin liquids Temperature Spikes Noted: No (11.6 declining) Respiratory Status: Room air History of Recent Intubation: No Behavior/Cognition: Alert;Cooperative;Pleasant mood;Confused;Distractible;Requires cueing  (impulsive feeding behavior) Oral Cavity Assessment: Within Functional Limits Oral Care Completed by SLP: Recent completion by staff Oral Cavity - Dentition: Missing dentition (many) Vision:  (dx'd blindness but able to feed self w/ touch/setup) Self-Feeding Abilities: Able to feed self;Needs set up;Needs assist Patient Positioning: Upright in bed (needed full positioning support) Baseline Vocal Quality: Normal (few words spoken when asked questions) Volitional Cough: Strong Volitional Swallow: Able to elicit    Oral/Motor/Sensory Function Overall Oral Motor/Sensory Function: Within functional limits (grossly WFL )   Ice Chips Ice chips: Not tested   Thin Liquid Thin Liquid: Within functional limits Presentation: Self Fed;Straw (~6+ ozs)    Nectar Thick Nectar Thick Liquid: Not tested   Honey Thick Honey Thick Liquid: Not tested   Puree Puree: Within functional limits Presentation: Spoon (fed; 3 trials)   Solid     Solid: Impaired Presentation: Self Fed (1/2 of a sandwich) Oral Phase Impairments: Impaired mastication;Poor awareness of bolus (impulsive feeding) Oral Phase Functional Implications: Impaired mastication (lengthy oral phase time, cues to clear ) Pharyngeal Phase Impairments:  (none)       Jerilynn Som, MS, CCC-SLP Bohdan Macho 04/30/2020,4:11 PM

## 2020-04-30 NOTE — Progress Notes (Signed)
1        [facility]   PATIENT NAME: Chad Bailey    MR#:  [id number]  DATE OF BIRTH:  April 06, 1930  SUBJECTIVE:  CHIEF COMPLAINT:  No chief complaint on file. Feels very weak, agreeable to go to rehab if family chooses so. REVIEW OF SYSTEMS:  Review of Systems  Constitutional: Positive for malaise/fatigue. Negative for diaphoresis, fever and weight loss.  HENT: Negative for ear discharge, ear pain, hearing loss, nosebleeds, sore throat and tinnitus.   Eyes: Negative for blurred vision and pain.  Respiratory: Negative for cough, hemoptysis, shortness of breath and wheezing.   Cardiovascular: Negative for chest pain, palpitations, orthopnea and leg swelling.  Gastrointestinal: Negative for abdominal pain, blood in stool, constipation, diarrhea, heartburn, nausea and vomiting.  Genitourinary: Negative for dysuria, frequency and urgency.  Musculoskeletal: Positive for back pain and neck pain. Negative for myalgias.  Skin: Negative for itching and rash.  Neurological: Positive for weakness. Negative for dizziness, tingling, tremors, focal weakness, seizures and headaches.  Psychiatric/Behavioral: Negative for depression. The patient is not nervous/anxious.    DRUG ALLERGIES:  No Known Allergies VITALS:  Blood pressure (!) 147/74, pulse 78, temperature 97.9 F (36.6 C), temperature source Oral, resp. rate 18, height 5\' 11"  (1.803 m), weight 72.6 kg, SpO2 97 %. PHYSICAL EXAMINATION:  Physical Exam Constitutional:      Appearance: He is underweight.  HENT:     Head: Normocephalic and atraumatic.  Eyes:     Conjunctiva/sclera: Conjunctivae normal.     Pupils: Pupils are equal, round, and reactive to light.  Neck:     Thyroid: No thyromegaly.     Trachea: No tracheal deviation.  Cardiovascular:     Rate and Rhythm: Normal rate and regular rhythm.     Heart sounds: Normal heart sounds.  Pulmonary:     Effort: Pulmonary effort is normal. No respiratory  distress.     Breath sounds: Normal breath sounds. No wheezing.  Chest:     Chest wall: No tenderness.  Abdominal:     General: Bowel sounds are normal. There is no distension.     Palpations: Abdomen is soft.     Tenderness: There is no abdominal tenderness.  Musculoskeletal:        General: Normal range of motion.     Cervical back: Normal range of motion and neck supple.  Skin:    General: Skin is warm and dry.     Findings: No rash.  Neurological:     Mental Status: He is alert. He is disoriented and confused.     Cranial Nerves: No cranial nerve deficit.     Motor: Weakness present.     Comments: Globally weak mainly lower extremities more than upper    LABORATORY PANEL:  Male CBC Recent Labs  Lab 04/30/20 0618  WBC 11.6*  HGB 14.0  HCT 40.7  PLT 267   ------------------------------------------------------------------------------------------------------------------ Chemistries  Recent Labs  Lab 04/30/20 0618  NA 134*  K 3.9  CL 100  CO2 23  GLUCOSE 176*  BUN 22  CREATININE 0.84  CALCIUM 8.8*  MG 1.6*  AST 22  ALT 18  ALKPHOS 57  BILITOT 2.1*   RADIOLOGY:  DG Chest 1 View  Result Date: 04/29/2020 CLINICAL DATA:  Weakness. Altered mental status. Recent fall. Elevated white blood cell count. EXAM: CHEST  1 VIEW COMPARISON:  Chest radiograph 03/05/2017 FINDINGS: Heart is normal in size. Normal mediastinal contours. No confluent airspace disease. No evidence  of large pleural effusion or pneumothorax. Calcified granuloma in the right upper lung. No acute osseous abnormalities are seen. Chronic high-riding humeral heads consistent with underlying rotator cuff arthropathy. IMPRESSION: No acute chest finding. Electronically Signed   By: Narda Rutherford M.D.   On: 04/29/2020 22:39   CT HEAD WO CONTRAST  Result Date: 04/29/2020 CLINICAL DATA:  Head trauma, minor; fell and hit head. Additional history provided: Difficulty walking since fall, slurred speech. EXAM:  CT HEAD WITHOUT CONTRAST TECHNIQUE: Contiguous axial images were obtained from the base of the skull through the vertex without intravenous contrast. COMPARISON:  Head CT examinations 04/17/2017 FINDINGS: Brain: Stable, mild generalized parenchymal atrophy. Stable, moderate ill-defined hypoattenuation within the cerebral white matter which is nonspecific, but consistent with chronic small vessel ischemic disease. 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 There is no acute intracranial hemorrhage. No acute demarcated cortical infarct is identified. No extra-axial fluid collection. No evidence of intracranial mass. No midline shift. Vascular: No hyperdense vessel.  Atherosclerotic calcifications. Skull: Normal. Negative for fracture or focal lesion. Sinuses/Orbits: Visualized orbits show no acute finding. Mild paranasal sinus mucosal thickening, most notably ethmoidal. No significant mastoid effusion. IMPRESSION: No CT evidence of acute intracranial abnormality. 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 Stable mild generalized parenchymal atrophy and moderate cerebral white matter chronic small vessel ischemic changes. Mild paranasal sinus mucosal thickening, most notably ethmoidal. Electronically Signed   By: Jackey Loge DO   On: 04/29/2020 14:33   CT Lumbar Spine Wo Contrast  Result Date: 04/29/2020 CLINICAL DATA:  86 year old male status post fall yesterday with subsequent low back pain. EXAM: CT LUMBAR SPINE WITHOUT CONTRAST TECHNIQUE: Multidetector CT imaging of the lumbar spine was performed without intravenous contrast administration. Multiplanar CT image reconstructions were also generated. COMPARISON:  None. FINDINGS: Segmentation: Normal. Alignment: S shaped scoliosis from the thoracolumbar junction to the sacrum (coronal image 42). Mild straightening of lumbar lordosis. Mild retrolisthesis of L2 on L3. Vertebrae: No acute osseous  abnormality identified. Intact visible sacrum and SI joints. Paraspinal and other soft tissues: Partially visible opacification at the posterior right costophrenic angle. Superimposed chronic calcific pancreatitis with pancreatic atrophy. Aortoiliac calcified atherosclerosis. Vascular patency is not evaluated in the absence of IV contrast. Negative lumbar paraspinal soft tissues aside from posterior paraspinal muscle atrophy. Disc levels: Widespread lumbar spine degeneration in the setting of scoliosis. Acquired appearing ankylosis at T11-T12 and L3-L4. Moderate to severe degeneration with multifactorial spinal stenosis at the L2-L3 level. Degenerative spinal stenosis also at L3-L4. IMPRESSION: 1. No acute osseous abnormality in the lumbar spine. S-Shaped lumbar scoliosis with advanced spinal degeneration. Multifactorial spinal stenosis at L2-L3 and L3-L4. 2. Partially visible right lung base opacification, attention on the two-view chest radiographs which are pending at this time. 3. Chronic calcific pancreatitis. Aortic Atherosclerosis (ICD10-I70.0). Electronically Signed   By: Odessa Fleming M.D.   On: 04/29/2020 22:38   MR BRAIN WO CONTRAST  Result Date: 04/30/2020 CLINICAL DATA:  Follow-up examination for acute stroke, altered mental status, weakness. EXAM: MRI HEAD WITHOUT CONTRAST TECHNIQUE: Multiplanar, multiecho pulse sequences of the brain and surrounding structures were obtained without intravenous contrast. COMPARISON:  Prior CT from 04/29/2020. FINDINGS: Brain: Examination moderately degraded by motion artifact. Generalized age-related cerebral atrophy. Patchy and confluent T2/FLAIR hyperintensity within the periventricular deep white matter both cerebral hemispheres most consistent with chronic small vessel ischemic disease, moderate in nature. Superimposed small remote right frontal lobe infarct, right MCA distribution. No abnormal foci of restricted diffusion  to suggest acute or subacute ischemia.  Gray-white matter differentiation maintained. No encephalomalacia to suggest chronic cortical infarction elsewhere within the brain. No foci of susceptibility artifact to suggest acute or chronic intracranial hemorrhage. No mass lesion, midline shift or mass effect. No hydrocephalus or extra-axial fluid collection. Pituitary gland suprasellar region within normal limits. Midline structures intact. Vascular: Major intracranial vascular flow voids are maintained. Skull and upper cervical spine: Degenerative thickening at the tectorial membrane without significant stenosis noted. Craniocervical junction otherwise unremarkable. Bone marrow signal intensity within normal limits. No scalp soft tissue abnormality. Sinuses/Orbits: Patient status post ocular lens replacement on the right. Globes and orbital soft tissues demonstrate no acute finding. Mild chronic mucosal thickening noted within the ethmoidal air cells and maxillary sinuses. Paranasal sinuses are otherwise clear. No mastoid effusion. Inner ear structures grossly normal. Other: None. IMPRESSION: 1. No acute intracranial abnormality. 2. Age-related cerebral atrophy with moderate chronic small vessel ischemic disease, with superimposed small remote right frontal lobe infarct. Electronically Signed   By: Rise Mu M.D.   On: 04/30/2020 04:25   ASSESSMENT AND PLAN:  86 year old male who is legally blind with a known history of diabetes, hypertension, hyperlipidemia is admitted for generalized weakness.  Generalized weakness and failure to thrive Present on admission. Most of the information is per patient's son with whom I talked over phone and by review of medical records. His CT head and MRI of the brain are negative for any acute pathology Patient was seen by Dr. Malvin Johns from [facility] neurology in October 2018 for similar issues with recurrent fall and was diagnosed with polyneuropathy We will get PT/OT evaluation as family is interested in  placement as they are not able to take care of him at home. -We will get palliative care consultation  Diabetes mellitus Sliding scale insulin, continue insulin Lantus 13 units subcu nightly hemoglobin A1c 7.3, TSH normal  Permanent atrial fibrillation Continue Pradaxa for anticoagulation, rate is controlled  Severe protein calorie malnutrition. Present on admission From poor oral nutrition and aging  Body mass index is 22.32 kg/m.      Status is: Inpatient  Remains inpatient appropriate because:Unsafe d/c plan   Dispo: The patient is from: Home              Anticipated d/c is to: SNF              Anticipated d/c date is: 1 day              Patient currently is not medically stable to d/c.  Await PT/OT evaluation for safe disposition planning.  Also palliative care evaluation pending    DVT prophylaxis:             dabigatran (PRADAXA) capsule 150 mg     Family Communication: Updated son Theodoro Grist over phone   All the records are reviewed and case discussed with Care Management/Social Worker. Management plans discussed with the patient, family (discussed with son Theodoro Grist over phone) and they are in agreement.  CODE STATUS: Partial Code  TOTAL TIME TAKING CARE OF THIS PATIENT: 35 minutes.   More than 50% of the time was spent in counseling/coordination of care: YES  POSSIBLE D/C IN 1-2 DAYS, DEPENDING ON CLINICAL CONDITION.   Delfino Lovett M.D on 04/30/2020 at 1:08 PM  [facility]   CC: Primary care physician; Mickey Farber, MD  Note: This dictation was prepared with Dragon dictation along with smaller phrase technology. Any transcriptional errors that result from this process are  unintentional.

## 2020-04-30 NOTE — ED Notes (Signed)
Pt back from MRI. Pt had removed the urine bag. New one applied. Wet chux removed and new one placed. Blood sugar checked and insulin given. Pt resting quietly.

## 2020-04-30 NOTE — ED Notes (Signed)
Lab contacted to draw am labs. Pt sleeping soundly

## 2020-04-30 NOTE — ED Notes (Signed)
Pt placed on bedpan for a BM

## 2020-04-30 NOTE — ED Notes (Signed)
Meal tray given 

## 2020-04-30 NOTE — Progress Notes (Signed)
OT Cancellation Note  Patient Details Name: ANTWOIN LACKEY MRN: [id number] DOB: 02-02-1930   Cancelled Treatment:    Reason Eval/Treat Not Completed: Other (comment). OT order received and chart reviewed. Per chart review pt has been confused this date. Per conversation c RN, pt has continued to remove lines/leads and is not able to follow commands to participate in OT evaluation. Will follow up at later date/time as able.   Kathie Dike, M.S. OTR/L  04/30/20, 1:56 PM

## 2020-05-01 DIAGNOSIS — Z515 Encounter for palliative care: Secondary | ICD-10-CM

## 2020-05-01 DIAGNOSIS — G9340 Encephalopathy, unspecified: Secondary | ICD-10-CM | POA: Diagnosis not present

## 2020-05-01 DIAGNOSIS — Z7189 Other specified counseling: Secondary | ICD-10-CM | POA: Diagnosis not present

## 2020-05-01 LAB — BLOOD CULTURE ID PANEL (REFLEXED)

## 2020-05-01 LAB — CBC
HCT: 43.1 % (ref 39.0–52.0)
Hemoglobin: 15.1 g/dL (ref 13.0–17.0)
MCH: 30.6 pg (ref 26.0–34.0)
MCHC: 35 g/dL (ref 30.0–36.0)
MCV: 87.2 fL (ref 80.0–100.0)
Platelets: 292 10*3/uL (ref 150–400)
RBC: 4.94 MIL/uL (ref 4.22–5.81)
RDW: 13.2 % (ref 11.5–15.5)
WBC: 14.4 10*3/uL — ABNORMAL HIGH (ref 4.0–10.5)
nRBC: 0 % (ref 0.0–0.2)

## 2020-05-01 LAB — GLUCOSE, CAPILLARY
Glucose-Capillary: 238 mg/dL — ABNORMAL HIGH (ref 70–99)
Glucose-Capillary: 280 mg/dL — ABNORMAL HIGH (ref 70–99)
Glucose-Capillary: 298 mg/dL — ABNORMAL HIGH (ref 70–99)
Glucose-Capillary: 273 mg/dL — ABNORMAL HIGH (ref 70–99)
Glucose-Capillary: 257 mg/dL — ABNORMAL HIGH (ref 70–99)

## 2020-05-01 MED ORDER — SODIUM CHLORIDE 0.9 % IV SOLN: INTRAVENOUS | Status: DC

## 2020-05-01 MED ORDER — MAGNESIUM SULFATE 2 GM/50ML IV SOLN
2.0000 g | Freq: Once | INTRAVENOUS | Status: AC
  Administered 2020-05-01: 2 g via INTRAVENOUS
  Filled 2020-05-01: qty 50

## 2020-05-01 MED ORDER — HYDRALAZINE HCL 20 MG/ML IJ SOLN
10.0000 mg | Freq: Once | INTRAMUSCULAR | Status: AC
  Administered 2020-05-01: 10 mg via INTRAVENOUS

## 2020-05-01 MED ORDER — ADULT MULTIVITAMIN W/MINERALS CH
1.0000 | ORAL_TABLET | Freq: Every day | ORAL | Status: DC
  Administered 2020-05-02 – 2020-05-03 (×2): 1 via ORAL
  Filled 2020-05-01 (×2): qty 1

## 2020-05-01 MED ORDER — NEPRO/CARBSTEADY PO LIQD
237.0000 mL | Freq: Three times a day (TID) | ORAL | Status: DC
  Administered 2020-05-01 – 2020-05-02 (×3): 237 mL via ORAL

## 2020-05-01 NOTE — Evaluation (Signed)
Physical Therapy Evaluation Patient Details Name: Chad Bailey MRN: [id number] DOB: Aug 17, 1930 Today's Date: 05/01/2020   History of Present Illness  Patient is an 86 year old male who fell on 04/28/20 and hit his head when standing inside his walker and had no control of his legs and "went down". X ray clear of lumbar spine, chest as well as head CT. MRI: Age-related cerebral atrophy with moderate chronic small vessel ischemic disease, with superimposed small remote right frontal lobe infarct. PMH includes HLD, HTN, A fib, Type II DM w background retinopathy w/o macular edema, vision loss, giant cell arteritis w polymyalgia rheumatica, leukocytosis, elevated troponin, acute encephalopathy, weakness, PVD, and orthostasis.    Clinical Impression  Patient is a 86 year old male with diffuse weakness and limited mobility. Patient is a poor historian and wife was unavailable for history.  Co-treat performed with OT for patient safety and to address the multiple needs of the patient.  Patient is in bed upon arrival and is able to respond with short verbage occasionally as well as follow simple commands occasionally. Patient has diffuse bruising and scrapes throughout body. Patient does not vebalize pain however does grimace when moving supine to sit max-total x 2 assist. Bilateral LE's are limited with contractures resulting in poor sitting stability requiring mod/max A to stabilize EOB. Patient urinated while sitting EOB requiring return to supine rolling L and R for cleanup/care. Patient able to grab bar on side of bed but requires assistance to turn and maintain turn. Total A for positioning back in bed required. Patient will benefit from skilled physical therapy while hospitalized to increase strength, mobility, and transfers. Upon discharge patient will benefit from SNF placement due to need for high level of care.     Follow Up Recommendations SNF    Equipment Recommendations  Other (comment) (none  if going to SNF)    Recommendations for Other Services       Precautions / Restrictions Precautions Precautions: Fall Restrictions Weight Bearing Restrictions: No      Mobility  Bed Mobility Overal bed mobility: Needs Assistance Bed Mobility: Rolling;Supine to Sit;Sit to Supine Rolling: Mod assist;Max assist;+2 for physical assistance;+2 for safety/equipment   Supine to sit: Max assist;Total assist;+2 for physical assistance;+2 for safety/equipment;HOB elevated Sit to supine: Mod assist;+2 for safety/equipment;+2 for physical assistance;HOB elevated   General bed mobility comments: Patient is very weak with multiple contractures, requires 2 person assist.  Transfers                 General transfer comment: unable to attempt  Ambulation/Gait             General Gait Details: unable to attempt  Stairs            Wheelchair Mobility    Modified Rankin (Stroke Patients Only)       Balance Overall balance assessment: Needs assistance;History of Falls   Sitting balance-Leahy Scale: Zero Sitting balance - Comments: requires mod-max A to maintain sitting balance EOB Postural control: Posterior lean     Standing balance comment: unable to attempt                             Pertinent Vitals/Pain Pain Assessment: Faces Faces Pain Scale: Hurts even more Pain Location: back, legs, arms Pain Descriptors / Indicators: Aching Pain Intervention(s): Monitored during session;Repositioned    Home Living Family/patient expects to be discharged to:: Unsure  Additional Comments: Patient's wife not available to obtain history information. Patient unable to assist with history.    Prior Function Level of Independence: Needs assistance   Gait / Transfers Assistance Needed: Per previous documentation patient utilizes a walker  ADL's / Homemaking Assistance Needed: Per previous documentation patient's wife assists with  ADLs  Comments: History limited due to patient verbalization limitations     Hand Dominance   Dominant Hand: Right    Extremity/Trunk Assessment   Upper Extremity Assessment Upper Extremity Assessment: Defer to OT evaluation    Lower Extremity Assessment Lower Extremity Assessment: Generalized weakness;Difficult to assess due to impaired cognition (Patient has bilateral knee/hip contractures into flexion, able to help lift legs back into bed but does not follow commands)       Communication   Communication: HOH;Other (comment) (poor responses)  Cognition Arousal/Alertness: Lethargic Behavior During Therapy: Flat affect Overall Cognitive Status: No family/caregiver present to determine baseline cognitive functioning                                 General Comments: Patient responds to simple commands, able to state basic needs, unable to answer questions about home set up.      General Comments General comments (skin integrity, edema, etc.): multiple abrasions and open wounds. poorly groomed, overgrown tonails, contractures of limbs.    Exercises Other Exercises Other Exercises: Patient educated and performed bed mobilityx2 assist, rolling L and R with cues for hand placement and assistance for changing of patient after toileting, raising up into bed for proper positioning, and maintaining EOB sitting position with mod-max A   Assessment/Plan    PT Assessment Patient needs continued PT services  PT Problem List Decreased strength;Decreased range of motion;Decreased activity tolerance;Decreased balance;Decreased knowledge of use of DME;Decreased cognition;Decreased coordination;Decreased mobility;Decreased safety awareness;Decreased knowledge of precautions;Pain;Decreased skin integrity       PT Treatment Interventions DME instruction;Gait training;Functional mobility training;Therapeutic activities;Stair training;Patient/family education;Cognitive  remediation;Neuromuscular re-education;Balance training;Therapeutic exercise;Wheelchair mobility training;Manual techniques    PT Goals (Current goals can be found in the Care Plan section)  Acute Rehab PT Goals PT Goal Formulation: Patient unable to participate in goal setting Time For Goal Achievement: 05/15/20    Frequency Min 2X/week   Barriers to discharge Decreased caregiver support patient will require SNF due to high level of care needed.    Co-evaluation PT/OT/SLP Co-Evaluation/Treatment: Yes Reason for Co-Treatment: Complexity of the patient's impairments (multi-system involvement);For patient/therapist safety;To address functional/ADL transfers PT goals addressed during session: Mobility/safety with mobility;Balance;Strengthening/ROM;Proper use of DME OT goals addressed during session: ADL's and self-care;Proper use of Adaptive equipment and DME;Strengthening/ROM       AM-PAC PT "6 Clicks" Mobility  Outcome Measure Help needed turning from your back to your side while in a flat bed without using bedrails?: A Lot Help needed moving from lying on your back to sitting on the side of a flat bed without using bedrails?: Total Help needed moving to and from a bed to a chair (including a wheelchair)?: Total Help needed standing up from a chair using your arms (e.g., wheelchair or bedside chair)?: Total Help needed to walk in hospital room?: Total Help needed climbing 3-5 steps with a railing? : Total 6 Click Score: 7    End of Session   Activity Tolerance: Patient limited by fatigue;Patient limited by pain Patient left: in bed;with call bell/phone within reach;with bed alarm set Nurse Communication: Mobility status (food arrived)  PT Visit Diagnosis: Unsteadiness on feet (R26.81);Other abnormalities of gait and mobility (R26.89);Repeated falls (R29.6);Muscle weakness (generalized) (M62.81);History of falling (Z91.81);Difficulty in walking, not elsewhere classified  (R26.2);Pain Pain - Right/Left:  (total) Pain - part of body:  (back)    Time: 7253-6644 PT Time Calculation (min) (ACUTE ONLY): 18 min   Charges:   PT Evaluation $PT Eval Moderate Complexity: 1 Mod        Precious Bard, PT, DPT   05/01/2020, 12:43 PM

## 2020-05-01 NOTE — NC FL2 (Signed)
[place] MEDICAID FL2 LEVEL OF CARE SCREENING TOOL     IDENTIFICATION  Patient Name: Chad Bailey Birthdate: [birth year] Sex: male Admission Date (Current Location): 04/29/2020  [place] and [place] Number:  Chiropodist and Address:  [facility], [address] [place], [place] [address]      Provider Number: [id number]  Attending Physician Name and Address:  Alwyn Ren, MD  Relative Name and Phone Number:  [place], [facility]) [phone]    Current Level of Care: Hospital Recommended Level of Care: Skilled Nursing Facility Prior Approval Number:    Date Approved/Denied:   PASRR Number:    Discharge Plan: SNF    Current Diagnoses: Patient Active Problem List   Diagnosis Date Noted  . Goals of care, counseling/discussion   . Palliative care by specialist   . Weakness 04/30/2020  . Acute encephalopathy 04/29/2020  . Hypoglycemia 10/07/2016  . Shakiness 10/07/2016  . Hypotension 10/07/2016  . Leukocytosis 10/07/2016  . Elevated troponin 10/07/2016  . Hypomagnesemia 10/07/2016  . Altered mental status 10/06/2016  . Benign fibroma of prostate 10/11/2015  . Giant cell arteritis with polymyalgia rheumatica (HCC) 08/31/2015  . Absence of bladder continence 08/28/2015  . Intractable headache 08/23/2015  . Absolute anemia 08/12/2015  . B12 deficiency 07/03/2015  . Glaucoma 06/30/2015  . Peripheral nerve disease 06/30/2015  . Nicotine addiction 06/30/2015  . Vision loss, bilateral 12/19/2014  . Inflammation of uveal tract 12/04/2014  . Preoperative cardiovascular examination 06/18/2014  . Bradycardia 06/18/2014  . Orthostasis 03/22/2013  . Peripheral vascular disease in diabetes mellitus (HCC) 03/01/2013  . CVA (cerebral vascular accident) (HCC) 03/01/2013  . 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 History of tobacco abuse   . gouty arthritis   . Hyperlipidemia  11/04/2010  . HYPERTENSION, BENIGN 11/04/2010  . Atrial fibrillation (HCC) 11/04/2010    Orientation RESPIRATION BLADDER Height & Weight     Self, Place  Normal Incontinent Weight: 156 lb 1.4 oz (70.8 kg) Height:  6' (182.9 cm)  BEHAVIORAL SYMPTOMS/MOOD NEUROLOGICAL BOWEL NUTRITION STATUS      Incontinent    AMBULATORY STATUS COMMUNICATION OF NEEDS Skin   Total Care Verbally                         Personal Care Assistance Level of Assistance  Total care       Total Care Assistance: Maximum assistance   Functional Limitations Info             SPECIAL CARE FACTORS FREQUENCY  PT (By licensed PT), OT (By licensed OT)     PT Frequency: 5 x weekly OT Frequency: 5 x weekly            Contractures Contractures Info: Not present    Additional Factors Info  Code Status Code Status Info: Partial             Current Medications (05/01/2020):  This is the current hospital active medication list Current Facility-Administered Medications  Medication Dose Route Frequency Provider Last Rate Last Admin  . 0.9 %  sodium chloride infusion   Intravenous Continuous Alwyn Ren, MD      . acetaminophen (TYLENOL) tablet 650 mg  650 mg Oral Q6H PRN Therisa Doyne, MD       Or  . acetaminophen (TYLENOL) suppository 650 mg  650 mg Rectal Q6H PRN Therisa Doyne, MD      .  atorvastatin (LIPITOR) tablet 10 mg  10 mg Oral Daily Therisa Doyne, MD   10 mg at 05/01/20 0174  . dabigatran (PRADAXA) capsule 150 mg  150 mg Oral Q12H Doutova, Anastassia, MD   150 mg at 05/01/20 1211  . docusate sodium (COLACE) capsule 100 mg  100 mg Oral BID Therisa Doyne, MD   100 mg at 05/01/20 0823  . feeding supplement (NEPRO CARB STEADY) liquid 237 mL  237 mL Oral TID BM Alwyn Ren, MD      . finasteride (PROSCAR) tablet 5 mg  5 mg Oral Daily Doutova, Anastassia, MD   5 mg at 05/01/20 0823  . hydrALAZINE (APRESOLINE) injection 10 mg  10 mg Intravenous Q6H PRN  Jimmye Norman, NP   10 mg at 05/01/20 1212  . insulin aspart (novoLOG) injection 0-9 Units  0-9 Units Subcutaneous Q4H Therisa Doyne, MD   5 Units at 05/01/20 1211  . insulin glargine (LANTUS) injection 13 Units  13 Units Subcutaneous QHS Therisa Doyne, MD   13 Units at 04/30/20 2107  . magnesium sulfate IVPB 2 g 50 mL  2 g Intravenous Once Alwyn Ren, MD      . Melene Muller ON 05/02/2020] multivitamin with minerals tablet 1 tablet  1 tablet Oral Daily Alwyn Ren, MD      . ondansetron [facility]) tablet 4 mg  4 mg Oral Q6H PRN Therisa Doyne, MD       Or  . ondansetron (ZOFRAN) injection 4 mg  4 mg Intravenous Q6H PRN Doutova, Anastassia, MD      . tamsulosin (FLOMAX) capsule 0.4 mg  0.4 mg Oral Daily Doutova, Anastassia, MD   0.4 mg at 05/01/20 0823  . timolol (TIMOPTIC) 0.5 % ophthalmic solution 1 drop  1 drop Both Eyes BID Therisa Doyne, MD   1 drop at 05/01/20 0823  . Travoprost (BAK Free) (TRAVATAN) 0.004 % ophthalmic solution SOLN 1 drop  1 drop Both Eyes QHS Therisa Doyne, MD         Discharge Medications: Please see discharge summary for a list of discharge medications.  Relevant Imaging Results:  Relevant Lab Results:   Additional Information SS# [id number]  Eilleen Kempf, LCSW

## 2020-05-01 NOTE — Consult Note (Signed)
Consultation Note Date: 05/01/2020   Patient Name: Chad Bailey  DOB: 1929/10/26  MRN: [id number]  Age / Sex: 86 y.o., male  PCP: Mickey Farber, MD Referring Physician: Alwyn Ren, MD  Reason for Consultation: Establishing goals of care  HPI/Patient Profile: 86 y.o. male  with past medical history of T2DM, HTN, HLD, blindness, giant cell arteritis, polymyalgia rheumatica, a fib, CVA, and renal artery thrombosis admitted on 04/29/2020 with AMS and fall. Head CT and MRI negative. Poor PO intake with severe malnutrition. PMT consulted for goals of care.   Clinical Assessment and Goals of Care: I have reviewed medical records including EPIC notes, labs and imaging, received report from RN, assessed the patient and then spoke with patient's son, Chad Bailey,  to discuss diagnosis prognosis, GOC, EOL wishes, disposition and options.  I introduced Palliative Medicine as specialized medical care for people living with serious illness. It focuses on providing relief from the symptoms and stress of a serious illness. The goal is to improve quality of life for both the patient and the family.  Chad Bailey tells me of decline at home since patient returned home from rehab stay at [facility]. He tells me of drastic functional decline - telling me patient basically lives in his chair - he wears depends. Occasionally will use walker and assistance to ambulate to bathroom or kitchen. He tells me of modifying diet to soft foods as patient has been unable to tolerate regular diet. He does not think that appetite has decreased. Son tells me of cognitive decline - patient more confused and forgetful at home. Tells me of patient pretending to feed himself though no food was present. Son wonders if patient has undiagnosed dementia.  Chad Bailey shares concerns about patient's wife, his mother - Shares her health is also declining. She is debilitated and exhausted. Chad Bailey serves as primary  caregiver for both. Chad Bailey also shares that his sister is an Charity fundraiser and has good understanding of situation. The family makes decisions jointly.    We discussed patient's current illness and what it means in the larger context of patient's on-going co-morbidities. Discussed overall decline in function and nutrition.   I attempted to elicit values and goals of care important to the patient. Chad Bailey tells me this is not something he and patient ever discussed. He does state he does not think patient would want to go live in a nursing facility long term but he worries how much longer he will be able to care for patient.   Discussed with Chad Bailey the importance of continued conversation with family and the medical providers regarding overall plan of care and treatment options, ensuring decisions are within the context of the patient's values and GOCs.    At this point, family is interested in seeing how PT/OT evaluations go and they would like to pursue rehab. We discussed palliative following up outpatient and Chad Bailey was agreeable. Hospice was discussed - discussed when this may be appropriate for patient and what type of care would be provided. Chad Bailey was agreeable to considering hospice depending on how patient does at rehab.   Questions and concerns were addressed. The family was encouraged to call with questions or concerns.   Primary Decision Maker NEXT OF KIN - wife    SUMMARY OF RECOMMENDATIONS   - family interested in rehab with outpatient palliative to follow - referral made - will follow  Code Status/Advance Care Planning:  Limited code - no CPR, no intubation  Prognosis:   Unable to determine  Discharge Planning: Skilled Nursing Facility for rehab with Palliative care service follow-up      Primary Diagnoses: Present on Admission: . Acute encephalopathy . Atrial fibrillation (HCC) . Elevated troponin . Giant cell arteritis with polymyalgia rheumatica (HCC) . Hyperlipidemia .  HYPERTENSION, BENIGN . Leukocytosis . Type 2 diabetes mellitus with background retinopathy without macular edema (HCC) . Vision loss, bilateral   I have reviewed the medical record, interviewed the patient and family, and examined the patient. The following aspects are pertinent.  Past Medical History:  Diagnosis Date  . Blindness   . Dysuria   . Fracture of left ankle   . Giant cell arteritis (HCC)   . gouty arthritis   . History of tobacco abuse   . Hyperlipidemia   . Hypertension   . Loss of vision 01/2013   left eye  . Non-insulin dependent diabetes mellitus   . PAF (paroxysmal atrial fibrillation) (HCC)    a. on pradaxa; b. CHADS2VASc at least 7 (HTN, age x 2, DM, stroke, vascular disease)  . Retinal artery thrombosis, left 2014  . Thyroid nodule 2014  . Type 2 diabetes mellitus (HCC)    Social History   Socioeconomic History  . Marital status: Married    Spouse name: Not on file  . Number of children: Not on file  . Years of education: Not on file  . Highest education level: Not on file  Occupational History  . Not on file  Tobacco Use  . Smoking status: Former Smoker    Packs/day: 6.00    Years: 56.00    Pack years: 336.00    Types: Cigarettes, Pipe    Quit date: 10/05/2002    Years since quitting: 17.5  . Smokeless tobacco: Former Neurosurgeon    Types: Chew  . Tobacco comment: smoked 6 pipes/day;smoked cigarettes 1/2 PPD ,quit chewing tobacco in 2004  Substance and Sexual Activity  . Alcohol use: No  . Drug use: No  . Sexual activity: Not on file  Other Topics Concern  . Not on file  Social History Narrative  . Not on file   Social Determinants of Health   Financial Resource Strain:   . Difficulty of Paying Living Expenses:   Food Insecurity:   . Worried About Programme researcher, broadcasting/film/video in the Last Year:   . Barista in the Last Year:   Transportation Needs:   . Freight forwarder (Medical):   Marland Kitchen Lack of Transportation (Non-Medical):   Physical  Activity:   . Days of Exercise per Week:   . Minutes of Exercise per Session:   Stress:   . Feeling of Stress :   Social Connections:   . Frequency of Communication with Friends and Family:   . Frequency of Social Gatherings with Friends and Family:   . Attends Religious Services:   . Active Member of Clubs or Organizations:   . Attends Banker Meetings:   Marland Kitchen Marital Status:    Family History  Problem Relation Age of Onset  . Heart disease Mother   . Tuberculosis Mother   . Lung cancer Father   . Cancer Maternal Grandmother        bowel  . Kidney disease Neg Hx   . Prostate cancer Neg Hx    Scheduled Meds: . atorvastatin  10 mg Oral Daily  . dabigatran  150 mg Oral Q12H  . docusate sodium  100 mg Oral BID  . feeding supplement (NEPRO CARB STEADY)  237 mL Oral TID BM  . finasteride  5 mg Oral Daily  . insulin aspart  0-9 Units Subcutaneous Q4H  . insulin glargine  13 Units Subcutaneous QHS  . [START ON 05/02/2020] multivitamin with minerals  1 tablet Oral Daily  . tamsulosin  0.4 mg Oral Daily  . timolol  1 drop Both Eyes BID  . Travoprost (BAK Free)  1 drop Both Eyes QHS   Continuous Infusions: PRN Meds:.acetaminophen **OR** acetaminophen, hydrALAZINE, HYDROcodone-acetaminophen, ondansetron **OR** ondansetron (ZOFRAN) IV No Known Allergies Review of Systems  Unable to perform ROS: Mental status change    Physical Exam Constitutional:      General: He is not in acute distress.    Comments: lethargic  Pulmonary:     Effort: Pulmonary effort is normal.  Musculoskeletal:     Right lower leg: No edema.     Left lower leg: No edema.  Skin:    General: Skin is warm and dry.  Neurological:     Mental Status: He is disoriented.     Vital Signs: BP (!) 175/80 (BP Location: Right Arm) Comment: RN Tresa Endo Notified  Pulse 80   Temp 97.8 F (36.6 C) (Oral)   Resp 14   Ht 6' (1.829 m)   Wt 70.8 kg   SpO2 98%   BMI 21.17 kg/m  Pain Scale: 0-10   Pain  Score: 0-No pain   SpO2: SpO2: 98 % O2 Device:SpO2: 98 % O2 Flow Rate: .   IO: Intake/output summary:   Intake/Output Summary (Last 24 hours) at 05/01/2020 1248 Last data filed at 05/01/2020 1013 Gross per 24 hour  Intake 240 ml  Output --  Net 240 ml    LBM:   Baseline Weight: Weight: 72.6 kg Most recent weight: Weight: 70.8 kg     Palliative Assessment/Data: PPS 30%    Time Total: 70 minutes Greater than 50%  of this time was spent counseling and coordinating care related to the above assessment and plan.  Gerlean Ren, DNP, AGNP-C Palliative Medicine Team [phone] Pager: [phone]

## 2020-05-01 NOTE — Progress Notes (Signed)
Patient was too unsteady to obtain orthostatic B/P safely, Dr. Ashley Royalty notified and will start ordered fluids for now and continue to monitor.

## 2020-05-01 NOTE — TOC Progression Note (Addendum)
Transition of Care [facility]) - Progression Note    Patient Details  Name: TYRA MICHELLE MRN: [id number] Date of Birth: [birth year]  [facility]) CM/SW Contact  Eilleen Kempf, LCSW Phone Number: 05/01/2020, 4:45 PM  Clinical Narrative:    Patient eval from PT/OT recommending SNF placement. CSW called spouse, Grier Rocher and discussed discharge plan with patient and spouse. Grier Rocher agreed to start the process and said her daughter will know the name of the SNF he was at before, she will call me back with the name. I explained it would be best to send out to a few places just in case the one they prefer doesn't have a bed available, they are fine with that. FL2/PASRR# all complete, bed searched started, still waiting for wife to call me back.   5:50pm Patient's wife called me back, They are requesting placement at [facility].          Expected Discharge Plan and Services   [facility]                                           Social Determinants of Health (SDOH) Interventions    Readmission Risk Interventions No flowsheet data found.

## 2020-05-01 NOTE — Progress Notes (Signed)
PROGRESS NOTE    Chad Bailey  OZH:[id number] DOB: 03-09-1930 DOA: 04/29/2020 PCP: Mickey Farber, MD   Brief Narrative: 86 year old male lives at home with his family with history of giant cell arteritis blindness type 2 diabetes hypertension paroxysmal atrial fibrillation and renal artery thrombosis admitted with confusion and generalized weakness.  Patient had a fall 04/28/2020 hit his head while he was standing inside the walker and he had no control of his legs.  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  Blood culture 1 out of 4 came back positive for coag negative staph aureus which is likely contaminant.  Seen by physical therapy recommending SNF.  Appreciate palliative care input. Patient is much more awake and alert today.  Answer all my questions appropriately. Lactic acid 2.0-1.7. White count increased 14.4 from 11.6 yesterday.  #2 chronic permanent atrial fibrillation-on Pradaxa  #3 acquired thrombophilia secondary to A. fib on anticoagulation to prevent stroke  #4 type 2 diabetes on Lantus continue SSI.  Hemoglobin A1c 7.1 this admission.  TSH 1.3. CBG (last 3) on Lantus 13 units nightly. Recent Labs    05/01/20 0415 05/01/20 0755 05/01/20 1202  GLUCAP 238* 280* 298*     #5 severe protein calorie malnutrition-present on admission due to aging and decreased p.o. intake.  #6 mild hyponatremia likely due to decreased p.o. intake.  We will start slow normal  saline.  #7 hypomagnesemia replete magnesium level 1.6 potassium 3.9.  #8 hyperlipidemia on Lipitor  #9 BPH on Proscar and Flomax.  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  #11 goals of care -patient is partial code with advanced age and multiple comorbidities.  #12 hypotension patient likely orthostatic we will check orthostatic and start normal saline cautiously.   Nutrition Problem: Severe Malnutrition Etiology: social / environmental circumstances (advanced age)     Signs/Symptoms: severe fat depletion, severe muscle depletion    Interventions: Nepro shake, MVI  Estimated body mass index is 21.17 kg/m as calculated from the following:   Height as of this encounter: 6' (1.829 m).   Weight as of this encounter: 70.8 kg.  DVT prophylaxis: Pradaxa Code Status: Partial code  family Communication: None at bedside  disposition Plan:  Status is: Inpatient  Dispo: The patient is from: Home              Anticipated d/c is to: SNF              Anticipated d/c date is: 2 days              Patient currently is not medically stable to d/c.    Consultants: None  Procedures: None Antimicrobials  Anti-infectives (From admission, onward)   Start     Dose/Rate Route Frequency Ordered Stop   04/29/20 2345  vancomycin (VANCOREADY) IVPB 1750 mg/350 mL        1,750 mg 175 mL/hr over 120 Minutes Intravenous  Once 04/29/20 2340 04/30/20 6295  04/29/20 2345  piperacillin-tazobactam (ZOSYN) IVPB 3.375 g        3.375 g 12.5 mL/hr over 240 Minutes Intravenous  Once 04/29/20 2340 04/30/20 0630      Subjective:  Patient resting in bed he is blind however he is awake and alert and able to answer questions appropriately. Objective: Vitals:   05/01/20 0408 05/01/20 0432 05/01/20 1202 05/01/20 1300  BP: (!) 187/84 (!) 180/94 (!) 175/80 (!) 104/60  Pulse: 61  80 73  Resp: 20  14   Temp: 97.7 F (36.5 C)  97.8 F (36.6 C)   TempSrc: Oral   Oral   SpO2: 98%  98%   Weight:      Height:        Intake/Output Summary (Last 24 hours) at 05/01/2020 1429 Last data filed at 05/01/2020 1013 Gross per 24 hour  Intake 240 ml  Output --  Net 240 ml   Filed Weights   04/29/20 1403 04/30/20 2331  Weight: 72.6 kg 70.8 kg    Examination: Oral mucosa dry General exam: Appears calm and comfortable  Respiratory system: Clear to auscultation. Respiratory effort normal. Cardiovascular system: S1 & S2 heard, RRR. No JVD, murmurs, rubs, gallops or clicks. No pedal edema. Gastrointestinal system: Abdomen is nondistended, soft and nontender. No organomegaly or masses felt. Normal bowel sounds heard. Central nervous system: Alert and oriented. No focal neurological deficits. Extremities: Symmetric 5 x 5 power. Skin: No rashes, lesions or ulcers Psychiatry: Judgement and insight appear normal. Mood & affect appropriate.     Data Reviewed: I have personally reviewed following labs and imaging studies  CBC: Recent Labs  Lab 04/29/20 1407 04/30/20 0618 05/01/20 0742  WBC 17.8* 11.6* 14.4*  NEUTROABS 14.5* 8.3*  --   HGB 14.7 14.0 15.1  HCT 42.6 40.7 43.1  MCV 89.3 89.3 87.2  PLT 304 267 292   Basic Metabolic Panel: Recent Labs  Lab 04/29/20 1407 04/30/20 0113 04/30/20 0618  NA 133*  --  134*  K 4.2  --  3.9  CL 100  --  100  CO2 26  --  23  GLUCOSE 271*  --  176*  BUN 25*  --  22  CREATININE 0.81  --  0.84  CALCIUM 8.9  --  8.8*  MG  --  1.8 1.6*  PHOS 2.7  --  2.1*   GFR: Estimated Creatinine Clearance: 62 mL/min (by C-G formula based on SCr of 0.84 mg/dL). Liver Function Tests: Recent Labs  Lab 04/29/20 1407 04/30/20 0618  AST 27 22  ALT 19 18  ALKPHOS 59 57  BILITOT 1.6* 2.1*  PROT 7.3 7.0  ALBUMIN 3.7 3.5   No results for input(s): LIPASE, AMYLASE in the last 168 hours. No results for input(s): AMMONIA in the last 168 hours. Coagulation Profile: Recent Labs  Lab 04/29/20 2241  INR 1.4*   Cardiac  Enzymes: Recent Labs  Lab 04/30/20 0113  CKTOTAL 108   BNP (last 3 results) No results for input(s): PROBNP in the last 8760 hours. HbA1C: Recent Labs    04/29/20 1407 04/30/20 0618  HGBA1C 7.3* 7.1*   CBG: Recent Labs  Lab 04/30/20 2024 04/30/20 2346 05/01/20 0415 05/01/20 0755 05/01/20 1202  GLUCAP 189* 225* 238* 280* 298*   Lipid Profile: No results for input(s): CHOL, HDL, LDLCALC, TRIG, CHOLHDL, LDLDIRECT in the last 72 hours. Thyroid Function Tests: Recent Labs    04/30/20 0618  TSH 1.331   Anemia Panel: No results for input(s):  VITAMINB12, FOLATE, FERRITIN, TIBC, IRON, RETICCTPCT in the last 72 hours. Sepsis Labs: Recent Labs  Lab 04/29/20 2241 04/30/20 0112 04/30/20 0113 04/30/20 0618  PROCALCITON  --   --  <0.10  --   LATICACIDVEN 2.0* 2.0*  --  1.7    Recent Results (from the past 240 hour(s))  SARS Coronavirus 2 by RT PCR (hospital order, performed in [facility] hospital lab) Nasopharyngeal Nasopharyngeal Swab     Status: None   Collection Time: 04/30/20 12:32 AM   Specimen: Nasopharyngeal Swab  Result Value Ref Range Status   SARS Coronavirus 2 NEGATIVE NEGATIVE Final    Comment: (NOTE) SARS-CoV-2 target nucleic acids are NOT DETECTED.  The SARS-CoV-2 RNA is generally detectable in upper and lower respiratory specimens during the acute phase of infection. The lowest concentration of SARS-CoV-2 viral copies this assay can detect is 250 copies / mL. A negative result does not preclude SARS-CoV-2 infection and should not be used as the sole basis for treatment or other patient management decisions.  A negative result may occur with improper specimen collection / handling, submission of specimen other than nasopharyngeal swab, presence of viral mutation(s) within the areas targeted by this assay, and inadequate number of viral copies (<250 copies / mL). A negative result must be combined with clinical observations, patient history, and  epidemiological information.  Fact Sheet for Patients:   BoilerBrush.com.cy  Fact Sheet for Healthcare Providers: https://pope.com/  This test is not yet approved or  cleared by the [place] FDA and has been authorized for detection and/or diagnosis of SARS-CoV-2 by FDA under an Emergency Use Authorization (EUA).  This EUA will remain in effect (meaning this test can be used) for the duration of the COVID-19 declaration under Section 564(b)(1) of the Act, 21 U.S.C. section 360bbb-3(b)(1), unless the authorization is terminated or revoked sooner.  Performed at [facility], [address] [place], [place] [address]   Culture, blood (x 2)     Status: None (Preliminary result)   Collection Time: 04/30/20 12:32 AM   Specimen: BLOOD  Result Value Ref Range Status   Specimen Description   Final    BLOOD RAC Performed at [facility], [address], [place], [place] [address]    Special Requests   Final    BOTTLES DRAWN AEROBIC AND ANAEROBIC BCAV Performed at [facility], [address] [place], [place] [address]    Culture  Setup Time   Final    AEROBIC BOTTLE ONLY GRAM POSITIVE COCCI CRITICAL RESULT CALLED TO, READ BACK BY AND VERIFIED WITH: DAVID BESANTI AT 8527 ON 05/01/2020 [facility]. Performed at [facility], [address] [place], [place] [address]    Culture GRAM POSITIVE COCCI  Final   Report Status PENDING  Incomplete  Culture, blood (x 2)     Status: None (Preliminary result)   Collection Time: 04/30/20 12:32 AM   Specimen: BLOOD  Result Value Ref Range Status   Specimen Description BLOOD RFOA  Final   Special Requests BOTTLES DRAWN AEROBIC AND ANAEROBIC BCAV  Final   Culture   Final    NO GROWTH 1 DAY Performed at [facility], [address], [place], [place] [address]    Report Status PENDING  Incomplete  Blood Culture ID Panel (Reflexed)     Status: Abnormal    Collection Time: 04/30/20 12:32 AM  Result Value Ref Range Status   Enterococcus species NOT DETECTED NOT DETECTED Final   Listeria  monocytogenes NOT DETECTED NOT DETECTED Final   Staphylococcus species DETECTED (A) NOT DETECTED Final    Comment: Methicillin (oxacillin) susceptible coagulase negative staphylococcus. Possible blood culture contaminant (unless isolated from more than one blood culture draw or clinical case suggests pathogenicity). No antibiotic treatment is indicated for blood  culture contaminants. CRITICAL RESULT CALLED TO, READ BACK BY AND VERIFIED WITH: DAVID BESANTI AT 4235 ON 05/01/2020 [facility].    Staphylococcus aureus (BCID) NOT DETECTED NOT DETECTED Final   Methicillin resistance NOT DETECTED NOT DETECTED Final   Streptococcus species NOT DETECTED NOT DETECTED Final   Streptococcus agalactiae NOT DETECTED NOT DETECTED Final   Streptococcus pneumoniae NOT DETECTED NOT DETECTED Final   Streptococcus pyogenes NOT DETECTED NOT DETECTED Final   Acinetobacter baumannii NOT DETECTED NOT DETECTED Final   Enterobacteriaceae species NOT DETECTED NOT DETECTED Final   Enterobacter cloacae complex NOT DETECTED NOT DETECTED Final   Escherichia coli NOT DETECTED NOT DETECTED Final   Klebsiella oxytoca NOT DETECTED NOT DETECTED Final   Klebsiella pneumoniae NOT DETECTED NOT DETECTED Final   Proteus species NOT DETECTED NOT DETECTED Final   Serratia marcescens NOT DETECTED NOT DETECTED Final   Haemophilus influenzae NOT DETECTED NOT DETECTED Final   Neisseria meningitidis NOT DETECTED NOT DETECTED Final   Pseudomonas aeruginosa NOT DETECTED NOT DETECTED Final   Candida albicans NOT DETECTED NOT DETECTED Final   Candida glabrata NOT DETECTED NOT DETECTED Final   Candida krusei NOT DETECTED NOT DETECTED Final   Candida parapsilosis NOT DETECTED NOT DETECTED Final   Candida tropicalis NOT DETECTED NOT DETECTED Final    Comment: Performed at [facility], [address]  [place], [place] [address]         Radiology Studies: DG Chest 1 View  Result Date: 04/29/2020 CLINICAL DATA:  Weakness. Altered mental status. Recent fall. Elevated white blood cell count. EXAM: CHEST  1 VIEW COMPARISON:  Chest radiograph 03/05/2017 FINDINGS: Heart is normal in size. Normal mediastinal contours. No confluent airspace disease. No evidence of large pleural effusion or pneumothorax. Calcified granuloma in the right upper lung. No acute osseous abnormalities are seen. Chronic high-riding humeral heads consistent with underlying rotator cuff arthropathy. IMPRESSION: No acute chest finding. Electronically Signed   By: Narda Rutherford M.D.   On: 04/29/2020 22:39   CT Lumbar Spine Wo Contrast  Result Date: 04/29/2020 CLINICAL DATA:  86 year old male status post fall yesterday with subsequent low back pain. EXAM: CT LUMBAR SPINE WITHOUT CONTRAST TECHNIQUE: Multidetector CT imaging of the lumbar spine was performed without intravenous contrast administration. Multiplanar CT image reconstructions were also generated. COMPARISON:  None. FINDINGS: Segmentation: Normal. Alignment: S shaped scoliosis from the thoracolumbar junction to the sacrum (coronal image 42). Mild straightening of lumbar lordosis. Mild retrolisthesis of L2 on L3. Vertebrae: No acute osseous abnormality identified. Intact visible sacrum and SI joints. Paraspinal and other soft tissues: Partially visible opacification at the posterior right costophrenic angle. Superimposed chronic calcific pancreatitis with pancreatic atrophy. Aortoiliac calcified atherosclerosis. Vascular patency is not evaluated in the absence of IV contrast. Negative lumbar paraspinal soft tissues aside from posterior paraspinal muscle atrophy. Disc levels: Widespread lumbar spine degeneration in the setting of scoliosis. Acquired appearing ankylosis at T11-T12 and L3-L4. Moderate to severe degeneration with multifactorial spinal stenosis at the L2-L3 level.  Degenerative spinal stenosis also at L3-L4. IMPRESSION: 1. No acute osseous abnormality in the lumbar spine. S-Shaped lumbar scoliosis with advanced spinal degeneration. Multifactorial spinal stenosis at L2-L3 and L3-L4. 2. Partially visible right lung  base opacification, attention on the two-view chest radiographs which are pending at this time. 3. Chronic calcific pancreatitis. Aortic Atherosclerosis (ICD10-I70.0). Electronically Signed   By: Odessa Fleming M.D.   On: 04/29/2020 22:38   MR BRAIN WO CONTRAST  Result Date: 04/30/2020 CLINICAL DATA:  Follow-up examination for acute stroke, altered mental status, weakness. EXAM: MRI HEAD WITHOUT CONTRAST TECHNIQUE: Multiplanar, multiecho pulse sequences of the brain and surrounding structures were obtained without intravenous contrast. COMPARISON:  Prior CT from 04/29/2020. FINDINGS: Brain: Examination moderately degraded by motion artifact. Generalized age-related cerebral atrophy. Patchy and confluent T2/FLAIR hyperintensity within the periventricular deep white matter both cerebral hemispheres most consistent with chronic small vessel ischemic disease, moderate in nature. Superimposed small remote right frontal lobe infarct, right MCA distribution. No abnormal foci of restricted diffusion to suggest acute or subacute ischemia. Gray-white matter differentiation maintained. No encephalomalacia to suggest chronic cortical infarction elsewhere within the brain. No foci of susceptibility artifact to suggest acute or chronic intracranial hemorrhage. No mass lesion, midline shift or mass effect. No hydrocephalus or extra-axial fluid collection. Pituitary gland suprasellar region within normal limits. Midline structures intact. Vascular: Major intracranial vascular flow voids are maintained. Skull and upper cervical spine: Degenerative thickening at the tectorial membrane without significant stenosis noted. Craniocervical junction otherwise unremarkable. Bone marrow signal  intensity within normal limits. No scalp soft tissue abnormality. Sinuses/Orbits: Patient status post ocular lens replacement on the right. Globes and orbital soft tissues demonstrate no acute finding. Mild chronic mucosal thickening noted within the ethmoidal air cells and maxillary sinuses. Paranasal sinuses are otherwise clear. No mastoid effusion. Inner ear structures grossly normal. Other: None. IMPRESSION: 1. No acute intracranial abnormality. 2. Age-related cerebral atrophy with moderate chronic small vessel ischemic disease, with superimposed small remote right frontal lobe infarct. Electronically Signed   By: Rise Mu M.D.   On: 04/30/2020 04:25        Scheduled Meds: . atorvastatin  10 mg Oral Daily  . dabigatran  150 mg Oral Q12H  . docusate sodium  100 mg Oral BID  . feeding supplement (NEPRO CARB STEADY)  237 mL Oral TID BM  . finasteride  5 mg Oral Daily  . insulin aspart  0-9 Units Subcutaneous Q4H  . insulin glargine  13 Units Subcutaneous QHS  . [START ON 05/02/2020] multivitamin with minerals  1 tablet Oral Daily  . tamsulosin  0.4 mg Oral Daily  . timolol  1 drop Both Eyes BID  . Travoprost (BAK Free)  1 drop Both Eyes QHS   Continuous Infusions:   LOS: 1 day     Alwyn Ren, MD If 7PM-7AM, please contact night-coverage [email or web address] Password [facility] 05/01/2020, 2:29 PM

## 2020-05-01 NOTE — Progress Notes (Signed)
PHARMACY - PHYSICIAN COMMUNICATION CRITICAL VALUE ALERT - BLOOD CULTURE IDENTIFICATION (BCID)  Chad Bailey is an 86 y.o. male who presented to [facility] on 04/29/2020 with a chief complaint of fall, confusion, and weakness.  Assessment: 1/4 bottles aerobic only Methicillin (oxacillin) susceptible coagulase negative staphylococcus   Name of physician (or Provider) Contacted: Dr Jerolyn Center  Current antibiotics: Blindness, giant cell arteritis, gout, HLD, HTN, diabetes mellitus type 2, per paroxysmal atrial fibrillation, retinal artery thrombosis,  Changes to prescribed antibiotics recommended:  Recommended to check CBC to trend WBC, hold antibiotics for now as likely contaminant.  Recommendations accepted by provider  Results for orders placed or performed during the hospital encounter of 04/29/20  Blood Culture ID Panel (Reflexed) (Collected: 04/30/2020 12:32 AM)  Result Value Ref Range   Enterococcus species NOT DETECTED NOT DETECTED   Listeria monocytogenes NOT DETECTED NOT DETECTED   Staphylococcus species DETECTED (A) NOT DETECTED   Staphylococcus aureus (BCID) NOT DETECTED NOT DETECTED   Methicillin resistance NOT DETECTED NOT DETECTED   Streptococcus species NOT DETECTED NOT DETECTED   Streptococcus agalactiae NOT DETECTED NOT DETECTED   Streptococcus pneumoniae NOT DETECTED NOT DETECTED   Streptococcus pyogenes NOT DETECTED NOT DETECTED   Acinetobacter baumannii NOT DETECTED NOT DETECTED   Enterobacteriaceae species NOT DETECTED NOT DETECTED   Enterobacter cloacae complex NOT DETECTED NOT DETECTED   Escherichia coli NOT DETECTED NOT DETECTED   Klebsiella oxytoca NOT DETECTED NOT DETECTED   Klebsiella pneumoniae NOT DETECTED NOT DETECTED   Proteus species NOT DETECTED NOT DETECTED   Serratia marcescens NOT DETECTED NOT DETECTED   Haemophilus influenzae NOT DETECTED NOT DETECTED   Neisseria meningitidis NOT DETECTED NOT DETECTED   Pseudomonas aeruginosa NOT DETECTED NOT  DETECTED   Candida albicans NOT DETECTED NOT DETECTED   Candida glabrata NOT DETECTED NOT DETECTED   Candida krusei NOT DETECTED NOT DETECTED   Candida parapsilosis NOT DETECTED NOT DETECTED   Candida tropicalis NOT DETECTED NOT DETECTED    Albina Billet, PharmD, BCPS Clinical Pharmacist 05/01/2020 7:20 AM

## 2020-05-01 NOTE — Progress Notes (Signed)
Initial Nutrition Assessment  DOCUMENTATION CODES:   Severe malnutrition in context of social or environmental circumstances  INTERVENTION:   Nepro Shake po TID, each supplement provides 425 kcal and 19 grams protein  MVI daily  Pt at high refeed risk; recommend monitor K, Mg and P labs daily.   NUTRITION DIAGNOSIS:   Severe Malnutrition related to social / environmental circumstances (advanced age) as evidenced by severe fat depletion, severe muscle depletion.  GOAL:   Patient will meet greater than or equal to 90% of their needs  MONITOR:   PO intake, Supplement acceptance, Labs, Weight trends, Skin, I & O's  REASON FOR ASSESSMENT:   Consult Assessment of nutrition requirement/status  ASSESSMENT:   86 y.o. male with medical history significant of blindness, giant cell arteritis, gout, HLD, HTN, diabetes mellitus type 2, per paroxysmal atrial fibrillation and retinal artery thrombosis who is admitted for acute encephalopathy and fall   Met with pt in room today. Pt is a poor historian so unable to provide much history. Pt reports good appetite and oral intake at baseline. Pt ate 100% of his breakfast this morning. Pt reports that he is willing to drink supplements in hospital. RD will add Nepro as this is carb steady. Pt is at high refeed risk. Pt seen by SLP and recommended for mechanical soft diet. Per chart, pt appears weight stable pta.   Medications reviewed and include: colace, insulin  Labs reviewed: Na 134(L), P 2.1(L), Mg 1.6(L) Wbc- 14.4(H) cbgs- 238, 280 x 24 hrs AIC 7.1(H)- 7/27  NUTRITION - FOCUSED PHYSICAL EXAM:    Most Recent Value  Orbital Region Severe depletion  Upper Arm Region Severe depletion  Thoracic and Lumbar Region Severe depletion  Buccal Region Severe depletion  Temple Region Severe depletion  Clavicle Bone Region Severe depletion  Clavicle and Acromion Bone Region Severe depletion  Scapular Bone Region Severe depletion  Dorsal Hand  Severe depletion  Patellar Region Severe depletion  Anterior Thigh Region Severe depletion  Posterior Calf Region Severe depletion  Edema (RD Assessment) None  Hair Reviewed  Eyes Reviewed  Mouth Reviewed  Skin Reviewed  Nails Reviewed     Diet Order:   Diet Order            Diet Carb Modified Fluid consistency: Thin; Room service appropriate? Yes with Assist  Diet effective now                EDUCATION NEEDS:   No education needs have been identified at this time  Skin:  Skin Assessment: Reviewed RN Assessment (ecchymosis)  Last BM:  7/28-- type 4  Height:   Ht Readings from Last 1 Encounters:  04/30/20 6' (1.829 m)    Weight:   Wt Readings from Last 1 Encounters:  04/30/20 70.8 kg    Ideal Body Weight:  80.9 kg  BMI:  Body mass index is 21.17 kg/m.  Estimated Nutritional Needs:   Kcal:  1900-2200kcal/day  Protein:  95-110g/day  Fluid:  >1.8L/day  Koleen Distance MS, RD, LDN Please refer to [facility] for RD and/or RD on-call/weekend/after hours pager

## 2020-05-01 NOTE — Evaluation (Signed)
Occupational Therapy Evaluation Patient Details Name: Chad Bailey MRN: [id number] DOB: 1930-05-12 Today's Date: 05/01/2020    History of Present Illness Patient is an 86 year old male who fell on 04/28/20 and hit his head when standing inside his walker and had no control of his legs and "went down". X ray clear of lumbar spine, chest as well as head CT. MRI: Age-related cerebral atrophy with moderate chronic small vessel ischemic disease, with superimposed small remote right frontal lobe infarct. PMH includes HLD, HTN, A fib, Type II DM w background retinopathy w/o macular edema, vision loss, giant cell arteritis w polymyalgia rheumatica, leukocytosis, elevated troponin, acute encephalopathy, weakness, PVD, and orthostasis.   Clinical Impression   Chad Bailey was seen for OT evaluation this date. Pt unable to provide PLOF or home setup this date, per chart review, pt's wife reported w/c use for mobility and assists with ADLs. Pt presents to acute OT demonstrating impaired ADL performance and functional mobility 2/2 decreased activity tolerance, functional strength/ROM/balance deficits, poor command following, poor safety awareness. Pt currently requires TOTAL A x2 for pericare at bed level c L+R rolling, pt noted to have increased tone t/o BUE/BLE. Pt noted to have scrapes/wounds across legs. TOTAL A x2 exit bed L side, MOD A x2 to return to bed - pt motivated to return to rest. Pt would benefit from skilled OT to address noted impairments and functional limitations (see below for any additional details) in order to maximize safety and independence while minimizing falls risk and caregiver burden. Upon hospital discharge, recommend STR to maximize pt safety and return to PLOF.     Follow Up Recommendations  SNF    Equipment Recommendations   (TBD next venue of care)    Recommendations for Other Services       Precautions / Restrictions Precautions Precautions: Fall Restrictions Weight  Bearing Restrictions: No      Mobility Bed Mobility Overal bed mobility: Needs Assistance Bed Mobility: Rolling;Supine to Sit;Sit to Supine Rolling: Max assist;+2 for physical assistance   Supine to sit: Total assist;+2 for physical assistance;HOB elevated Sit to supine: Mod assist;+2 for physical assistance;HOB elevated   General bed mobility comments: Pt motivated to return to supine, brought B legs back tobed, assist to manage BLE in bed   Transfers                 General transfer comment: unable to attempt    Balance Overall balance assessment: Needs assistance;History of Falls Sitting-balance support: Feet supported Sitting balance-Leahy Scale: Zero Sitting balance - Comments: requires mod-max A to maintain sitting balance EOB Postural control: Posterior lean     Standing balance comment: unable to attempt                           ADL either performed or assessed with clinical judgement   ADL Overall ADL's : Needs assistance/impaired                                       General ADL Comments: TOTAL A x2 pericare at bed level. Anticipate SETUP + VCs for self-feeding at bed level      Vision Baseline Vision/History: Legally blind       Perception     Praxis      Pertinent Vitals/Pain Pain Assessment: Faces Faces Pain Scale: Hurts even more Pain Location:  back, legs, arms Pain Descriptors / Indicators: Aching Pain Intervention(s): Monitored during session;Repositioned     Hand Dominance Right   Extremity/Trunk Assessment Upper Extremity Assessment Upper Extremity Assessment: Generalized weakness;RUE deficits/detail;LUE deficits/detail RUE Deficits / Details: AROM shoulder flexion ~90*, elbow flexion WFL, Grip 4-/5 LUE Deficits / Details: PROM shoulder flexion ~45*, Grip 4-/5   Lower Extremity Assessment Lower Extremity Assessment: Generalized weakness;Difficult to assess due to impaired cognition (appears  contracted)       Communication Communication Communication: HOH   Cognition Arousal/Alertness: Lethargic Behavior During Therapy: Flat affect Overall Cognitive Status: No family/caregiver present to determine baseline cognitive functioning                                 General Comments: Patient responds to simple commands, able to state basic needs, unable to answer questions about home set up.   General Comments  multiple abrasions and open wounds. poorly groomed, overgrown tonails, contractures of limbs    Exercises Exercises: Other exercises Other Exercises Other Exercises: Pt reoriented, toileting at bed level, bed mobility, sup<>sit, L+R rolling, sitting balance/tolerance   Shoulder Instructions      Home Living Family/patient expects to be discharged to:: Unsure                                 Additional Comments: Patient's wife not available to obtain history information. Patient unable to assist with history.      Prior Functioning/Environment Level of Independence: Needs assistance  Gait / Transfers Assistance Needed: Per SLP note, wife reported pt used w/c  ADL's / Homemaking Assistance Needed: Per SLP note, wife reported assisting c ADLs   Comments: History limited due to patient verbalization limitations        OT Problem List: Decreased strength;Decreased range of motion;Decreased activity tolerance;Impaired balance (sitting and/or standing);Decreased safety awareness;Impaired UE functional use      OT Treatment/Interventions: Self-care/ADL training;Therapeutic exercise;Energy conservation;DME and/or AE instruction;Therapeutic activities;Patient/family education;Balance training    OT Goals(Current goals can be found in the care plan section) Acute Rehab OT Goals Patient Stated Goal: pt unable to participate OT Goal Formulation: Patient unable to participate in goal setting Time For Goal Achievement: 05/15/20 Potential to  Achieve Goals: Fair ADL Goals Pt Will Perform Eating: with set-up;with supervision;bed level Pt Will Perform Upper Body Bathing: with mod assist;sitting Pt Will Transfer to Toilet: with max assist (rolling at bed level)  OT Frequency: Min 1X/week   Barriers to D/C: Decreased caregiver support          Co-evaluation PT/OT/SLP Co-Evaluation/Treatment: Yes Reason for Co-Treatment: Complexity of the patient's impairments (multi-system involvement);For patient/therapist safety;To address functional/ADL transfers PT goals addressed during session: Mobility/safety with mobility;Balance;Strengthening/ROM;Proper use of DME OT goals addressed during session: ADL's and self-care      AM-PAC OT "6 Clicks" Daily Activity     Outcome Measure Help from another person eating meals?: A Little Help from another person taking care of personal grooming?: A Lot Help from another person toileting, which includes using toliet, bedpan, or urinal?: Total Help from another person bathing (including washing, rinsing, drying)?: A Lot Help from another person to put on and taking off regular upper body clothing?: A Lot Help from another person to put on and taking off regular lower body clothing?: Total 6 Click Score: 11   End of Session    Activity  Tolerance: Patient limited by lethargy Patient left: in bed;with call bell/phone within reach;with bed alarm set  OT Visit Diagnosis: Unsteadiness on feet (R26.81);Other abnormalities of gait and mobility (R26.89);Muscle weakness (generalized) (M62.81)                Time: 7493-5521 OT Time Calculation (min): 18 min Charges:  OT General Charges $OT Visit: 1 Visit OT Evaluation $OT Eval Moderate Complexity: 1 Mod OT Treatments $Self Care/Home Management : 8-22 mins  Kathie Dike, M.S. OTR/L  05/01/20, 1:38 PM [id number]

## 2020-05-01 NOTE — Progress Notes (Signed)
[facility] Liaison note:  New referral for [employer] community Palliative program  to follow patient post discharge received from Palliative NP Harvest Dark. TOC Anthonette Legato aware.  Patient information given to referral. Thank you. Dayna Barker BSN, RN, [facility]

## 2020-05-01 NOTE — Plan of Care (Signed)
Continuing with plan of care. 

## 2020-05-02 DIAGNOSIS — R531 Weakness: Secondary | ICD-10-CM | POA: Diagnosis not present

## 2020-05-02 DIAGNOSIS — I4821 Permanent atrial fibrillation: Secondary | ICD-10-CM | POA: Diagnosis not present

## 2020-05-02 DIAGNOSIS — G9340 Encephalopathy, unspecified: Secondary | ICD-10-CM | POA: Diagnosis not present

## 2020-05-02 LAB — CBC
HCT: 46.9 % (ref 39.0–52.0)
Hemoglobin: 15.5 g/dL (ref 13.0–17.0)
MCH: 30.1 pg (ref 26.0–34.0)
MCHC: 33 g/dL (ref 30.0–36.0)
MCV: 91.1 fL (ref 80.0–100.0)
Platelets: 310 10*3/uL (ref 150–400)
RBC: 5.15 MIL/uL (ref 4.22–5.81)
RDW: 13.4 % (ref 11.5–15.5)
WBC: 14.2 10*3/uL — ABNORMAL HIGH (ref 4.0–10.5)
nRBC: 0 % (ref 0.0–0.2)

## 2020-05-02 LAB — GLUCOSE, CAPILLARY
Glucose-Capillary: 155 mg/dL — ABNORMAL HIGH (ref 70–99)
Glucose-Capillary: 165 mg/dL — ABNORMAL HIGH (ref 70–99)
Glucose-Capillary: 175 mg/dL — ABNORMAL HIGH (ref 70–99)
Glucose-Capillary: 178 mg/dL — ABNORMAL HIGH (ref 70–99)
Glucose-Capillary: 184 mg/dL — ABNORMAL HIGH (ref 70–99)
Glucose-Capillary: 186 mg/dL — ABNORMAL HIGH (ref 70–99)
Glucose-Capillary: 211 mg/dL — ABNORMAL HIGH (ref 70–99)

## 2020-05-02 LAB — BASIC METABOLIC PANEL
Anion gap: 15 (ref 5–15)
BUN: 32 mg/dL — ABNORMAL HIGH (ref 8–23)
CO2: 19 mmol/L — ABNORMAL LOW (ref 22–32)
Calcium: 8.7 mg/dL — ABNORMAL LOW (ref 8.9–10.3)
Chloride: 102 mmol/L (ref 98–111)
Creatinine, Ser: 1.48 mg/dL — ABNORMAL HIGH (ref 0.61–1.24)
GFR calc Af Amer: 49 mL/min — ABNORMAL LOW (ref >60)
GFR calc non Af Amer: 42 mL/min — ABNORMAL LOW (ref >60)
Glucose, Bld: 201 mg/dL — ABNORMAL HIGH (ref 70–99)
Potassium: 4 mmol/L (ref 3.5–5.1)
Sodium: 136 mmol/L (ref 135–145)

## 2020-05-02 LAB — MAGNESIUM: Magnesium: 2.5 mg/dL — ABNORMAL HIGH (ref 1.7–2.4)

## 2020-05-02 NOTE — Care Management Important Message (Signed)
Important Message  Patient Details  Name: Chad Bailey MRN: [id number] Date of Birth: [birth year]   Medicare Important Message Given:  Yes  Initial Medicare IM given by Patient Access Associate on 05/02/2020 at 12:12pm.    Johnell Comings 05/02/2020, 6:04 PM

## 2020-05-02 NOTE — Progress Notes (Signed)
PROGRESS NOTE    Chad Bailey  BTD:[id number] DOB: 10/07/1929 DOA: 04/29/2020 PCP: Mickey Farber, MD   Brief Narrative: 86 year old male lives at home with his family with history of giant cell arteritis blindness type 2 diabetes hypertension paroxysmal atrial fibrillation and renal artery thrombosis admitted with confusion and generalized weakness.  Patient had a fall 04/28/2020 hit his head while he was standing inside the walker and he had no control of his legs.  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  Blood culture 1 out of 4 came back positive for coag negative staph aureus which is likely contaminant.  Seen by physical therapy recommending SNF.  Appreciate palliative care input. Patient is much more awake and alert today.  Answer all my questions appropriately. Lactic acid 2.0-1.7. White count increased 14.2 from 11.6  #2 chronic permanent atrial fibrillation-on Pradaxa  #3 acquired thrombophilia secondary to A. fib on anticoagulation to prevent stroke  #4 type 2 diabetes on Lantus continue SSI.  Hemoglobin A1c 7.1 this admission.  TSH 1.3. on Lantus 13 units nightly. CBG (last 3)  Recent Labs    05/02/20 0021 05/02/20 0413 05/02/20 0801  GLUCAP 211* 165* 178*    #5 severe protein calorie malnutrition-present on admission due to aging and decreased p.o. intake.  #6 mild hyponatremia resolved with normal saline.  #7 hypomagnesemia repleted.  Potassium 4.  #8  hyperlipidemia on Lipitor  #9 BPH on Proscar and Flomax.  Patient has urinary retention in spite of Proscar and Flomax with bladder scan showing more than 600 cc of urine.  Will do in and out cath.  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  #11 goals of care -patient is partial code with advanced age and multiple comorbidities.  #12 hypotension patient likely orthostatic we will check orthostatic and start normal saline cautiously.  #13 AKI elevated creatinine 1.48 from 1.84 yesterday likely due to urinary retention and BPH.  Will do in and out catheter today if still continues to have urine retention will have to place Foley catheter for acute retention.   Nutrition Problem: Severe Malnutrition Etiology: social / environmental circumstances (advanced age)     Signs/Symptoms: severe fat depletion, severe muscle depletion    Interventions: Nepro shake, MVI  Estimated body mass index is 21.17 kg/m as calculated from the following:   Height as of this encounter: 6' (1.829 m).   Weight as of this encounter: 70.8 kg.  DVT prophylaxis: Pradaxa Code Status: Partial code  family Communication: Discussed with patient's wife and son disposition Plan:  Status is: Inpatient  Dispo: The patient is from: Home              Anticipated d/c is to: SNF              Anticipated d/c date is: 2 days              Patient currently is not medically stable to d/c.  Consultants: None  Procedures: None Antimicrobials  Anti-infectives (From admission, onward)   Start     Dose/Rate Route Frequency Ordered Stop   04/29/20 2345  vancomycin (VANCOREADY) IVPB 1750 mg/350 mL        1,750 mg 175 mL/hr over 120 Minutes Intravenous  Once 04/29/20 2340 04/30/20 0620   04/29/20 2345  piperacillin-tazobactam (ZOSYN) IVPB 3.375 g        3.375 g 12.5 mL/hr over 240 Minutes Intravenous  Once 04/29/20 2340 04/30/20 0630      Subjective:  Patient resting in bed  he is blind however he is awake and alert and able to answer questions appropriately. Objective: Vitals:   05/01/20 1202 05/01/20 1300 05/01/20 1934 05/02/20 0422  BP: (!) 175/80 (!) 104/60 (!) 176/90 (!) 167/94  Pulse: 80 73 78 90  Resp: 14  16 15   Temp: 97.8 F (36.6 C)  98.1 F (36.7 C) 97.9 F (36.6 C)  TempSrc: Oral  Axillary Axillary  SpO2: 98%  98% 98%  Weight:      Height:        Intake/Output Summary (Last 24 hours) at 05/02/2020 1058 Last data filed at 05/01/2020 1700 Gross per 24 hour  Intake 30.31 ml  Output --  Net 30.31 ml   Filed Weights   04/29/20 1403 04/30/20 2331  Weight: 72.6 kg 70.8 kg    Examination: Oral mucosa dry General exam: Appears calm and comfortable  Respiratory system: Clear to auscultation. Respiratory effort normal. Cardiovascular system: S1 & S2 heard, RRR. No JVD, murmurs, rubs, gallops or clicks. No pedal edema. Gastrointestinal system: Abdomen is nondistended, soft and nontender. No organomegaly or masses felt. Normal bowel sounds heard. Central nervous system: Alert and oriented. No focal neurological deficits. Extremities: Symmetric 5 x 5 power. Skin: No rashes, lesions or ulcers Psychiatry: Judgement and insight appear normal. Mood & affect appropriate.     Data Reviewed: I have personally reviewed following labs and imaging studies  CBC: Recent Labs  Lab 04/29/20 1407 04/30/20 0618 05/01/20 0742 05/02/20 0515  WBC 17.8* 11.6* 14.4* 14.2*  NEUTROABS 14.5* 8.3*  --   --   HGB 14.7 14.0 15.1 15.5  HCT 42.6 40.7 43.1 46.9  MCV 89.3 89.3 87.2 91.1  PLT 304 267 292 310   Basic Metabolic Panel: Recent Labs  Lab 04/29/20 1407 04/30/20 0113 04/30/20 0618 05/02/20 0515  NA 133*  --  134* 136  K 4.2  --  3.9 4.0  CL 100  --  100 102  CO2 26  --  23 19*  GLUCOSE 271*  --  176* 201*  BUN 25*  --  22 32*  CREATININE 0.81  --  0.84 1.48*  CALCIUM 8.9  --  8.8* 8.7*  MG  --  1.8 1.6* 2.5*  PHOS 2.7  --  2.1*  --     GFR: Estimated Creatinine Clearance: 35.2 mL/min (A) (by C-G formula based on SCr of 1.48 mg/dL (H)). Liver Function Tests: Recent Labs  Lab 04/29/20 1407 04/30/20 0618  AST 27 22  ALT 19 18  ALKPHOS 59 57  BILITOT 1.6* 2.1*  PROT 7.3 7.0  ALBUMIN 3.7 3.5   No results for input(s): LIPASE, AMYLASE in the last 168 hours. No results for input(s): AMMONIA in the last 168 hours. Coagulation Profile: Recent Labs  Lab 04/29/20 2241  INR 1.4*   Cardiac Enzymes: Recent Labs  Lab 04/30/20 0113  CKTOTAL 108   BNP (last 3 results) No results  for input(s): PROBNP in the last 8760 hours. HbA1C: Recent Labs    04/29/20 1407 04/30/20 0618  HGBA1C 7.3* 7.1*   CBG: Recent Labs  Lab 05/01/20 1628 05/01/20 2029 05/02/20 0021 05/02/20 0413 05/02/20 0801  GLUCAP 273* 257* 211* 165* 178*   Lipid Profile: No results for input(s): CHOL, HDL, LDLCALC, TRIG, CHOLHDL, LDLDIRECT in the last 72 hours. Thyroid Function Tests: Recent Labs    04/30/20 0618  TSH 1.331   Anemia Panel: No results for input(s): VITAMINB12, FOLATE, FERRITIN, TIBC, IRON, RETICCTPCT in the last 72 hours. Sepsis Labs: Recent Labs  Lab 04/29/20 2241 04/30/20 0112 04/30/20 0113 04/30/20 0618  PROCALCITON  --   --  <0.10  --   LATICACIDVEN 2.0* 2.0*  --  1.7    Recent Results (from the past 240 hour(s))  SARS Coronavirus 2 by RT PCR (hospital order, performed in [facility] hospital lab) Nasopharyngeal Nasopharyngeal Swab     Status: None   Collection Time: 04/30/20 12:32 AM   Specimen: Nasopharyngeal Swab  Result Value Ref Range Status   SARS Coronavirus 2 NEGATIVE NEGATIVE Final    Comment: (NOTE) SARS-CoV-2 target nucleic acids are NOT DETECTED.  The SARS-CoV-2 RNA is generally detectable in upper and lower respiratory specimens during the acute phase of infection. The lowest concentration of SARS-CoV-2 viral copies this assay can detect is 250 copies / mL. A negative result does not  preclude SARS-CoV-2 infection and should not be used as the sole basis for treatment or other patient management decisions.  A negative result may occur with improper specimen collection / handling, submission of specimen other than nasopharyngeal swab, presence of viral mutation(s) within the areas targeted by this assay, and inadequate number of viral copies (<250 copies / mL). A negative result must be combined with clinical observations, patient history, and epidemiological information.  Fact Sheet for Patients:   BoilerBrush.com.cyhttps://www.fda.gov/media/136312/download  Fact Sheet for Healthcare Providers: https://pope.com/https://www.fda.gov/media/136313/download  This test is not yet approved or  cleared by the [place] FDA and has been authorized for detection and/or diagnosis of SARS-CoV-2 by FDA under an Emergency Use Authorization (EUA).  This EUA will remain in effect (meaning this test can be used) for the duration of the COVID-19 declaration under Section 564(b)(1) of the Act, 21 U.S.C. section 360bbb-3(b)(1), unless the authorization is terminated or revoked sooner.  Performed at [facility], [address] [place], [place] [address]   Culture, blood (x 2)     Status: None (Preliminary result)   Collection Time: 04/30/20 12:32 AM   Specimen: BLOOD  Result Value Ref Range Status   Specimen Description   Final    BLOOD RAC Performed at [facility], [address] [place], [place] [address]    Special Requests   Final    BOTTLES DRAWN AEROBIC AND ANAEROBIC BCAV Performed at [facility], [address] [place], [place] [address]    Culture  Setup Time   Final    AEROBIC BOTTLE ONLY GRAM POSITIVE COCCI CRITICAL RESULT CALLED TO, READ BACK BY AND VERIFIED WITH: DAVID BESANTI AT [id number] ON 05/01/2020 [facility]. Performed at [facility], [address] [place], [place] [address]    Culture GRAM POSITIVE COCCI  Final   Report Status PENDING  Incomplete   Culture, blood (x 2)     Status: None (Preliminary result)   Collection Time: 04/30/20 12:32 AM   Specimen: BLOOD  Result Value Ref Range Status   Specimen Description  BLOOD RFOA  Final   Special Requests BOTTLES DRAWN AEROBIC AND ANAEROBIC BCAV  Final   Culture   Final    NO GROWTH 2 DAYS Performed at [facility], [address] [place], [place] [address]    Report Status PENDING  Incomplete  Blood Culture ID Panel (Reflexed)     Status: Abnormal   Collection Time: 04/30/20 12:32 AM  Result Value Ref Range Status   Enterococcus species NOT DETECTED NOT DETECTED Final   Listeria monocytogenes NOT DETECTED NOT DETECTED Final   Staphylococcus species DETECTED (A) NOT DETECTED Final    Comment: Methicillin (oxacillin) susceptible coagulase negative staphylococcus. Possible blood culture contaminant (unless isolated from more than one blood culture draw or clinical case suggests pathogenicity). No antibiotic treatment is indicated for blood  culture contaminants. CRITICAL RESULT CALLED TO, READ BACK BY AND VERIFIED WITH: DAVID BESANTI AT 6045 ON 05/01/2020 [facility].    Staphylococcus aureus (BCID) NOT DETECTED NOT DETECTED Final   Methicillin resistance NOT DETECTED NOT DETECTED Final   Streptococcus species NOT DETECTED NOT DETECTED Final   Streptococcus agalactiae NOT DETECTED NOT DETECTED Final   Streptococcus pneumoniae NOT DETECTED NOT DETECTED Final   Streptococcus pyogenes NOT DETECTED NOT DETECTED Final   Acinetobacter baumannii NOT DETECTED NOT DETECTED Final   Enterobacteriaceae species NOT DETECTED NOT DETECTED Final   Enterobacter cloacae complex NOT DETECTED NOT DETECTED Final   Escherichia coli NOT DETECTED NOT DETECTED Final   Klebsiella oxytoca NOT DETECTED NOT DETECTED Final   Klebsiella pneumoniae NOT DETECTED NOT DETECTED Final   Proteus species NOT DETECTED NOT DETECTED Final   Serratia marcescens NOT DETECTED NOT DETECTED Final   Haemophilus influenzae NOT  DETECTED NOT DETECTED Final   Neisseria meningitidis NOT DETECTED NOT DETECTED Final   Pseudomonas aeruginosa NOT DETECTED NOT DETECTED Final   Candida albicans NOT DETECTED NOT DETECTED Final   Candida glabrata NOT DETECTED NOT DETECTED Final   Candida krusei NOT DETECTED NOT DETECTED Final   Candida parapsilosis NOT DETECTED NOT DETECTED Final   Candida tropicalis NOT DETECTED NOT DETECTED Final    Comment: Performed at [facility], [address] [place], [place] [address]         Radiology Studies: No results found.      Scheduled Meds: . atorvastatin  10 mg Oral Daily  . dabigatran  150 mg Oral Q12H  . docusate sodium  100 mg Oral BID  . feeding supplement (NEPRO CARB STEADY)  237 mL Oral TID BM  . finasteride  5 mg Oral Daily  . insulin aspart  0-9 Units Subcutaneous Q4H  . insulin glargine  13 Units Subcutaneous QHS  . multivitamin with minerals  1 tablet Oral Daily  . tamsulosin  0.4 mg Oral Daily  . timolol  1 drop Both Eyes BID  . Travoprost (BAK Free)  1 drop Both Eyes QHS   Continuous Infusions: . sodium chloride 75 mL/hr at 05/02/20 0535     LOS: 2 days     Alwyn Ren, MD If 7PM-7AM, please contact night-coverage [email or web address] Password [facility] 05/02/2020, 10:58 AM

## 2020-05-03 DIAGNOSIS — I4821 Permanent atrial fibrillation: Secondary | ICD-10-CM | POA: Diagnosis not present

## 2020-05-03 DIAGNOSIS — G9340 Encephalopathy, unspecified: Secondary | ICD-10-CM | POA: Diagnosis not present

## 2020-05-03 DIAGNOSIS — R531 Weakness: Secondary | ICD-10-CM | POA: Diagnosis not present

## 2020-05-03 LAB — CULTURE, BLOOD (ROUTINE X 2)

## 2020-05-03 LAB — GLUCOSE, CAPILLARY
Glucose-Capillary: 141 mg/dL — ABNORMAL HIGH (ref 70–99)
Glucose-Capillary: 124 mg/dL — ABNORMAL HIGH (ref 70–99)
Glucose-Capillary: 81 mg/dL (ref 70–99)

## 2020-05-03 NOTE — Progress Notes (Signed)
dw son Chad Bailey all ?answered to the best of my ability

## 2020-05-03 NOTE — Discharge Summary (Addendum)
Physician Discharge Summary  Jaman Aro Caselli QMV:[id number] DOB: September 16, 1930 DOA: 04/29/2020  PCP: Mickey Farber, MD  Admit date: 04/29/2020 Discharge date: 05/03/2020  Admitted From: Home Disposition: Nursing home Recommendations for Outpatient Follow-up:  1. Follow up with PCP in 1-2 weeks 2. Please obtain BMP/CBC in one week 3. Palliative care to follow with the facility  Home Health none Equipment/Devices none  Discharge Condition: Stable and improved CODE STATUS full code Diet recommendation: Cardiac Please make sure he is on a soft diet with meats well cut moist food with aspiration precautions, crush pills in pure applesauce, supervise all meals. Brief/Interim Summary:86 year old male lives at home with his family with history of giant cell arteritis blindness type 2 diabetes hypertension paroxysmal atrial fibrillation and renal artery thrombosis admitted with confusion and generalized weakness.  Patient had a fall 04/28/2020 hit his head while he was standing inside the walker and he had no control of his legs.  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  Blood culture 1 out of 4 came back positive for coag negative staph aureus which is likely contaminant.  Seen by physical therapy recommending SNF.  Patient seen by palliative care during this admission.  His CODE STATUS is partial code. No source of infection was found.  #2 chronic permanent atrial fibrillation-on Pradaxa  #3  acquired thrombophilia secondary to A. fib on anticoagulation to prevent stroke  #4 type 2 diabetes on Lantus continue   #5 severe protein calorie malnutrition-present on admission due to aging and decreased p.o. intake.  Continue Ensure.  #6 mild hyponatremia resolved with normal saline.  #7 hypomagnesemia repleted.  Potassium 4.  #8 hyperlipidemia on Lipitor  #9 BPH on Proscar and Flomax.  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  #11 goals of care -patient is partial code with advanced age and multiple comorbidities.  #12 hypotension patient likely orthostatic we will check orthostatic and start normal saline cautiously.  #13 AKI elevated creatinine 1.48 from 1.84 yesterday likely due to urinary retention and BPH. Check bladder  scan at the facility as needed if he has persistent urinary retention consider placing Foley catheter.   Nutrition Problem: Severe Malnutrition Etiology: social / environmental circumstances (advanced age)    Signs/Symptoms: severe fat depletion, severe muscle depletion     Interventions: Nepro shake, MVI  Estimated body mass index is 21.17 kg/m as calculated from the following:   Height as of this encounter: 6' (1.829 m).   Weight as of this encounter: 70.8 kg.  Discharge Instructions  Discharge Instructions    Diet - low sodium heart healthy   Complete by: As directed    Increase activity slowly   Complete by: As directed      Allergies as of 05/03/2020   No Known Allergies     Medication List    STOP taking these medications   metFORMIN 1000 MG tablet Commonly known as: GLUCOPHAGE  oxyCODONE-acetaminophen 5-325 MG tablet Commonly known as: Roxicet   predniSONE 1 MG tablet Commonly known as: DELTASONE   timolol 0.5 % ophthalmic solution Commonly known as: TIMOPTIC     TAKE these medications   acetaminophen  500 MG tablet Commonly known as: TYLENOL Take 1,000 mg by mouth every 6 (six) hours as needed for pain.   atorvastatin 10 MG tablet Commonly known as: LIPITOR Take 10 mg by mouth daily.   ferrous sulfate 325 (65 FE) MG tablet Take 1 tablet by mouth 2 (two) times daily.   finasteride 5 MG tablet Commonly known as: PROSCAR TAKE ONE TABLET BY MOUTH ONCE DAILY   glucose blood test strip Check blood sugar twice daily as instructed. Pt has Contour Next EZ. Dx E11.319   HumaLOG 100 UNIT/ML injection Generic drug: insulin lispro Inject 7 Units into the skin 3 (three) times daily with meals.   insulin glargine 100 UNIT/ML injection Commonly known as: LANTUS Inject 0.1 mLs (10 Units total) into the skin at bedtime. Please taper off insulin Lantus by 2 units if you are tapering off prednisone, following your blood glucose levels,  Thank you What changed: how much to take   Microlet Lancets Misc USE ONE LANCET TWICE DAILY  Dx code:  E11.9   multivitamin capsule Take 1 capsule by mouth daily.   NovoFine 30G X 8 MM Misc Generic drug: Insulin Pen Needle USE ONE NEEDLE EACH TIME INSULIN IS INJECTED INTO THE SKIN   Pradaxa 150 MG Caps capsule Generic drug: dabigatran TAKE ONE CAPSULE BY MOUTH EVERY 12 HOURS What changed: See the new instructions.   Simbrinza 1-0.2 % Susp Generic drug: Brinzolamide-Brimonidine Apply to eye 2 (two) times daily.   tamsulosin 0.4 MG Caps capsule Commonly known as: FLOMAX TAKE ONE CAPSULE BY MOUTH ONCE DAILY   timolol 0.5 % ophthalmic solution Commonly known as: BETIMOL Place 1 drop into both eyes 2 (two) times daily.   Travoprost (BAK Free) 0.004 % Soln ophthalmic solution Commonly known as: TRAVATAN Place 1 drop into both eyes every evening.   vitamin C 500 MG tablet Commonly known as: ASCORBIC ACID Take 1 tablet by mouth daily.       Contact information for follow-up providers    Mickey Farber, MD Follow up.   Specialty: Internal  Medicine Contact information: [address] [facility] [place] [address] [phone]            Contact information for after-discharge care    Destination    [facility] .   Service: Skilled Nursing Contact information: [address] [facility] [address] [phone]                 No Known Allergies  Consultations:  NONE   Procedures/Studies: DG Chest 1 View  Result Date: 04/29/2020 CLINICAL DATA:  Weakness. Altered mental status. Recent fall. Elevated white blood cell count. EXAM: CHEST  1 VIEW COMPARISON:  Chest radiograph 03/05/2017 FINDINGS: Heart is normal in size. Normal mediastinal contours. No confluent airspace disease. No evidence of large pleural effusion or pneumothorax. Calcified granuloma in the right upper lung. No acute osseous abnormalities are seen. Chronic high-riding humeral heads consistent with underlying rotator cuff arthropathy. IMPRESSION: No acute chest finding. Electronically Signed   By: Narda Rutherford M.D.   On: 04/29/2020 22:39   CT HEAD WO CONTRAST  Result Date: 04/29/2020 CLINICAL DATA:  Head trauma, minor; fell and hit head. Additional history provided: Difficulty walking  since fall, slurred speech. EXAM: CT HEAD WITHOUT CONTRAST TECHNIQUE: Contiguous axial images were obtained from the base of the skull through the vertex without intravenous contrast. COMPARISON:  Head CT examinations 04/17/2017 FINDINGS: Brain: Stable, mild generalized parenchymal atrophy. Stable, moderate ill-defined hypoattenuation within the cerebral white matter which is nonspecific, but consistent with chronic small vessel ischemic disease. 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 There is no acute intracranial hemorrhage. No acute demarcated cortical infarct is identified. No extra-axial fluid collection. No evidence of intracranial mass. No midline shift. Vascular: No  hyperdense vessel.  Atherosclerotic calcifications. Skull: Normal. Negative for fracture or focal lesion. Sinuses/Orbits: Visualized orbits show no acute finding. Mild paranasal sinus mucosal thickening, most notably ethmoidal. No significant mastoid effusion. IMPRESSION: No CT evidence of acute intracranial abnormality. 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 Stable mild generalized parenchymal atrophy and moderate cerebral white matter chronic small vessel ischemic changes. Mild paranasal sinus mucosal thickening, most notably ethmoidal. Electronically Signed   By: Jackey LogeKyle  Golden DO   On: 04/29/2020 14:33   CT Lumbar Spine Wo Contrast  Result Date: 04/29/2020 CLINICAL DATA:  86 year old male status post fall yesterday with subsequent low back pain. EXAM: CT LUMBAR SPINE WITHOUT CONTRAST TECHNIQUE: Multidetector CT imaging of the lumbar spine was performed without intravenous contrast administration. Multiplanar CT image reconstructions were also generated. COMPARISON:  None. FINDINGS: Segmentation: Normal. Alignment: S shaped scoliosis from the thoracolumbar junction to the sacrum (coronal image 42). Mild straightening of lumbar lordosis. Mild retrolisthesis of L2 on L3. Vertebrae: No acute osseous abnormality identified. Intact visible sacrum and SI joints. Paraspinal and other soft tissues: Partially visible opacification at the posterior right costophrenic angle. Superimposed chronic calcific pancreatitis with pancreatic atrophy. Aortoiliac calcified atherosclerosis. Vascular patency is not evaluated in the absence of IV contrast. Negative lumbar paraspinal soft tissues aside from posterior paraspinal muscle atrophy. Disc levels: Widespread lumbar spine degeneration in the setting of scoliosis. Acquired appearing ankylosis at T11-T12 and L3-L4. Moderate to severe degeneration with multifactorial spinal stenosis at the L2-L3 level. Degenerative spinal stenosis also at L3-L4.  IMPRESSION: 1. No acute osseous abnormality in the lumbar spine. S-Shaped lumbar scoliosis with advanced spinal degeneration. Multifactorial spinal stenosis at L2-L3 and L3-L4. 2. Partially visible right lung base opacification, attention on the two-view chest radiographs which are pending at this time. 3. Chronic calcific pancreatitis. Aortic Atherosclerosis (ICD10-I70.0). Electronically Signed   By: Odessa FlemingH  Hall M.D.   On: 04/29/2020 22:38   MR BRAIN WO CONTRAST  Result Date: 04/30/2020 CLINICAL DATA:  Follow-up examination for acute stroke, altered mental status, weakness. EXAM: MRI HEAD WITHOUT CONTRAST TECHNIQUE: Multiplanar, multiecho pulse sequences of the brain and surrounding structures were obtained without intravenous contrast. COMPARISON:  Prior CT from 04/29/2020. FINDINGS: Brain: Examination moderately degraded by motion artifact. Generalized age-related cerebral atrophy. Patchy and confluent T2/FLAIR hyperintensity within the periventricular deep white matter both cerebral hemispheres most consistent with chronic small vessel ischemic disease, moderate in nature. Superimposed small remote right frontal lobe infarct, right MCA distribution. No abnormal foci of restricted diffusion to suggest acute or subacute ischemia. Gray-white matter differentiation maintained. No encephalomalacia to suggest chronic cortical infarction elsewhere within the brain. No foci of susceptibility artifact to suggest acute or chronic intracranial hemorrhage. No mass lesion, midline shift or mass effect. No hydrocephalus or extra-axial fluid collection. Pituitary gland suprasellar region within normal limits. Midline structures intact. Vascular: Major intracranial vascular flow voids are maintained. Skull and upper cervical spine: Degenerative thickening at  the tectorial membrane without significant stenosis noted. Craniocervical junction otherwise unremarkable. Bone marrow signal intensity within normal limits. No scalp soft  tissue abnormality. Sinuses/Orbits: Patient status post ocular lens replacement on the right. Globes and orbital soft tissues demonstrate no acute finding. Mild chronic mucosal thickening noted within the ethmoidal air cells and maxillary sinuses. Paranasal sinuses are otherwise clear. No mastoid effusion. Inner ear structures grossly normal. Other: None. IMPRESSION: 1. No acute intracranial abnormality. 2. Age-related cerebral atrophy with moderate chronic small vessel ischemic disease, with superimposed small remote right frontal lobe infarct. Electronically Signed   By: Rise Mu M.D.   On: 04/30/2020 04:25    (Echo, Carotid, EGD, Colonoscopy, ERCP)    Subjective:  He is resting in bed in no acute distress no events noted or reported overnight. Discharge Exam: Vitals:   05/03/20 0408 05/03/20 1233  BP: (!) 172/58 (!) 167/84  Pulse: 60 76  Resp: 16 14  Temp: (!) 97.5 F (36.4 C) 98 F (36.7 C)  SpO2: 97% 100%   Vitals:   05/02/20 1222 05/02/20 1956 05/03/20 0408 05/03/20 1233  BP: (!) 162/75 (!) 165/58 (!) 172/58 (!) 167/84  Pulse: 91 67 60 76  Resp: 17 17 16 14   Temp: 97.9 F (36.6 C) 98 F (36.7 C) (!) 97.5 F (36.4 C) 98 F (36.7 C)  TempSrc: Oral Oral Oral   SpO2: 99% 98% 97% 100%  Weight:      Height:        General: Pt is alert, awake, not in acute distress Cardiovascular: RRR, S1/S2 +, no rubs, no gallops Respiratory: CTA bilaterally, no wheezing, no rhonchi Abdominal: Soft, NT, ND, bowel sounds + Extremities: no edema, no cyanosis    The results of significant diagnostics from this hospitalization (including imaging, microbiology, ancillary and laboratory) are listed below for reference.     Microbiology: Recent Results (from the past 240 hour(s))  SARS Coronavirus 2 by RT PCR (hospital order, performed in [facility] hospital lab) Nasopharyngeal Nasopharyngeal Swab     Status: None   Collection Time: 04/30/20 12:32 AM   Specimen: Nasopharyngeal  Swab  Result Value Ref Range Status   SARS Coronavirus 2 NEGATIVE NEGATIVE Final    Comment: (NOTE) SARS-CoV-2 target nucleic acids are NOT DETECTED.  The SARS-CoV-2 RNA is generally detectable in upper and lower respiratory specimens during the acute phase of infection. The lowest concentration of SARS-CoV-2 viral copies this assay can detect is 250 copies / mL. A negative result does not preclude SARS-CoV-2 infection and should not be used as the sole basis for treatment or other patient management decisions.  A negative result may occur with improper specimen collection / handling, submission of specimen other than nasopharyngeal swab, presence of viral mutation(s) within the areas targeted by this assay, and inadequate number of viral copies (<250 copies / mL). A negative result must be combined with clinical observations, patient history, and epidemiological information.  Fact Sheet for Patients:   05/02/20  Fact Sheet for Healthcare Providers: BoilerBrush.com.cy  This test is not yet approved or  cleared by the https://pope.com/ FDA and has been authorized for detection and/or diagnosis of SARS-CoV-2 by FDA under an Emergency Use Authorization (EUA).  This EUA will remain in effect (meaning this test can be used) for the duration of the COVID-19 declaration under Section 564(b)(1) of the Act, 21 U.S.C. section 360bbb-3(b)(1), unless the authorization is terminated or revoked sooner.  Performed at [facility], [address] [place],  [place] [address]   Culture, blood (x 2)     Status: Abnormal   Collection Time: 04/30/20 12:32 AM   Specimen: BLOOD  Result Value Ref Range Status   Specimen Description   Final    BLOOD RAC Performed at [facility], [address], [place], [place] [address]    Special Requests   Final    BOTTLES DRAWN AEROBIC AND ANAEROBIC BCAV Performed at [facility], [address] [place], [place] [address]    Culture  Setup Time   Final    AEROBIC BOTTLE ONLY GRAM POSITIVE COCCI CRITICAL RESULT CALLED TO, READ BACK BY AND VERIFIED WITH: DAVID BESANTI AT [phone] ON 05/01/2020 [facility].    Culture (A)  Final    STAPHYLOCOCCUS SPECIES (COAGULASE NEGATIVE) THE SIGNIFICANCE OF ISOLATING THIS ORGANISM FROM A SINGLE SET OF BLOOD CULTURES WHEN MULTIPLE SETS ARE DRAWN IS UNCERTAIN. PLEASE NOTIFY THE MICROBIOLOGY DEPARTMENT WITHIN ONE WEEK IF SPECIATION AND SENSITIVITIES ARE REQUIRED. Performed at [facility], [address], [place], [place] [address]    Report Status 05/03/2020 FINAL  Final  Culture, blood (x 2)     Status: None (Preliminary result)   Collection Time: 04/30/20 12:32 AM   Specimen: BLOOD  Result Value Ref Range Status   Specimen Description BLOOD RFOA  Final   Special Requests BOTTLES DRAWN AEROBIC AND ANAEROBIC BCAV  Final   Culture   Final    NO GROWTH 3 DAYS Performed at [facility], [address], [place], [place] [address]    Report Status PENDING  Incomplete  Blood Culture ID Panel (Reflexed)     Status: Abnormal   Collection Time: 04/30/20 12:32 AM  Result Value Ref Range Status   Enterococcus species NOT DETECTED NOT DETECTED Final   Listeria monocytogenes NOT DETECTED NOT DETECTED Final   Staphylococcus species DETECTED (A) NOT DETECTED Final    Comment: Methicillin (oxacillin) susceptible coagulase negative staphylococcus. Possible blood culture contaminant (unless isolated from more than one blood culture draw or clinical case suggests pathogenicity). No antibiotic treatment is indicated for blood  culture contaminants. CRITICAL RESULT CALLED TO, READ BACK BY AND VERIFIED WITH: DAVID BESANTI AT 6578 ON 05/01/2020 [facility].    Staphylococcus aureus (BCID) NOT DETECTED NOT DETECTED Final   Methicillin resistance NOT DETECTED NOT DETECTED Final   Streptococcus species NOT DETECTED NOT DETECTED Final    Streptococcus agalactiae NOT DETECTED NOT DETECTED Final   Streptococcus pneumoniae NOT DETECTED NOT DETECTED Final   Streptococcus pyogenes NOT DETECTED NOT DETECTED Final   Acinetobacter baumannii NOT DETECTED NOT DETECTED Final   Enterobacteriaceae species NOT DETECTED NOT DETECTED Final   Enterobacter cloacae complex NOT DETECTED NOT DETECTED Final   Escherichia coli NOT DETECTED NOT DETECTED Final   Klebsiella oxytoca NOT DETECTED NOT DETECTED Final   Klebsiella pneumoniae NOT DETECTED NOT DETECTED Final   Proteus species NOT DETECTED NOT DETECTED Final   Serratia marcescens NOT DETECTED NOT DETECTED Final   Haemophilus influenzae NOT DETECTED NOT DETECTED Final   Neisseria meningitidis NOT DETECTED NOT DETECTED Final   Pseudomonas aeruginosa NOT DETECTED NOT DETECTED Final   Candida albicans NOT DETECTED NOT DETECTED Final   Candida glabrata NOT DETECTED NOT DETECTED Final   Candida krusei NOT DETECTED NOT DETECTED Final   Candida parapsilosis NOT DETECTED NOT DETECTED Final   Candida tropicalis NOT DETECTED NOT DETECTED Final    Comment: Performed at [facility], [address], [place], [place] [address]  Labs: BNP (last 3 results) No results for input(s): BNP in the last 8760 hours. Basic Metabolic Panel: Recent Labs  Lab 04/29/20 1407 04/30/20 0113 04/30/20 0618 05/02/20 0515  NA 133*  --  134* 136  K 4.2  --  3.9 4.0  CL 100  --  100 102  CO2 26  --  23 19*  GLUCOSE 271*  --  176* 201*  BUN 25*  --  22 32*  CREATININE 0.81  --  0.84 1.48*  CALCIUM 8.9  --  8.8* 8.7*  MG  --  1.8 1.6* 2.5*  PHOS 2.7  --  2.1*  --    Liver Function Tests: Recent Labs  Lab 04/29/20 1407 04/30/20 0618  AST 27 22  ALT 19 18  ALKPHOS 59 57  BILITOT 1.6* 2.1*  PROT 7.3 7.0  ALBUMIN 3.7 3.5   No results for input(s): LIPASE, AMYLASE in the last 168 hours. No results for input(s): AMMONIA in the last 168 hours. CBC: Recent Labs  Lab 04/29/20 1407  04/30/20 0618 05/01/20 0742 05/02/20 0515  WBC 17.8* 11.6* 14.4* 14.2*  NEUTROABS 14.5* 8.3*  --   --   HGB 14.7 14.0 15.1 15.5  HCT 42.6 40.7 43.1 46.9  MCV 89.3 89.3 87.2 91.1  PLT 304 267 292 310   Cardiac Enzymes: Recent Labs  Lab 04/30/20 0113  CKTOTAL 108   BNP: Invalid input(s): POCBNP CBG: Recent Labs  Lab 05/02/20 1952 05/02/20 2349 05/03/20 0404 05/03/20 0748 05/03/20 1235  GLUCAP 175* 155* 141* 124* 81   D-Dimer No results for input(s): DDIMER in the last 72 hours. Hgb A1c No results for input(s): HGBA1C in the last 72 hours. Lipid Profile No results for input(s): CHOL, HDL, LDLCALC, TRIG, CHOLHDL, LDLDIRECT in the last 72 hours. Thyroid function studies No results for input(s): TSH, T4TOTAL, T3FREE, THYROIDAB in the last 72 hours.  Invalid input(s): FREET3 Anemia work up No results for input(s): VITAMINB12, FOLATE, FERRITIN, TIBC, IRON, RETICCTPCT in the last 72 hours. Urinalysis    Component Value Date/Time   COLORURINE YELLOW (A) 04/30/2020 0113   APPEARANCEUR HAZY (A) 04/30/2020 0113   APPEARANCEUR Clear 11/01/2015 1504   LABSPEC 1.020 04/30/2020 0113   PHURINE 5.0 04/30/2020 0113   GLUCOSEU >=500 (A) 04/30/2020 0113   GLUCOSEU NEGATIVE 05/23/2014 1343   HGBUR NEGATIVE 04/30/2020 0113   BILIRUBINUR NEGATIVE 04/30/2020 0113   BILIRUBINUR Negative 11/01/2015 1504   KETONESUR 5 (A) 04/30/2020 0113   PROTEINUR NEGATIVE 04/30/2020 0113   UROBILINOGEN 0.2 05/23/2014 1343   NITRITE NEGATIVE 04/30/2020 0113   LEUKOCYTESUR NEGATIVE 04/30/2020 0113   Sepsis Labs Invalid input(s): PROCALCITONIN,  WBC,  LACTICIDVEN Microbiology Recent Results (from the past 240 hour(s))  SARS Coronavirus 2 by RT PCR (hospital order, performed in [facility] hospital lab) Nasopharyngeal Nasopharyngeal Swab     Status: None   Collection Time: 04/30/20 12:32 AM   Specimen: Nasopharyngeal Swab  Result Value Ref Range Status   SARS Coronavirus 2 NEGATIVE NEGATIVE  Final    Comment: (NOTE) SARS-CoV-2 target nucleic acids are NOT DETECTED.  The SARS-CoV-2 RNA is generally detectable in upper and lower respiratory specimens during the acute phase of infection. The lowest concentration of SARS-CoV-2 viral copies this assay can detect is 250 copies / mL. A negative result does not preclude SARS-CoV-2 infection and should not be used as the sole basis for treatment or other patient management decisions.  A negative result may occur with improper specimen collection /  handling, submission of specimen other than nasopharyngeal swab, presence of viral mutation(s) within the areas targeted by this assay, and inadequate number of viral copies (<250 copies / mL). A negative result must be combined with clinical observations, patient history, and epidemiological information.  Fact Sheet for Patients:   BoilerBrush.com.cy  Fact Sheet for Healthcare Providers: https://pope.com/  This test is not yet approved or  cleared by the [place] FDA and has been authorized for detection and/or diagnosis of SARS-CoV-2 by FDA under an Emergency Use Authorization (EUA).  This EUA will remain in effect (meaning this test can be used) for the duration of the COVID-19 declaration under Section 564(b)(1) of the Act, 21 U.S.C. section 360bbb-3(b)(1), unless the authorization is terminated or revoked sooner.  Performed at [facility], [address] [place], [place] [address]   Culture, blood (x 2)     Status: Abnormal   Collection Time: 04/30/20 12:32 AM   Specimen: BLOOD  Result Value Ref Range Status   Specimen Description   Final    BLOOD RAC Performed at [facility], [address] [place], [place] [address]    Special Requests   Final    BOTTLES DRAWN AEROBIC AND ANAEROBIC BCAV Performed at [facility], [address] [place], [place] [address]    Culture  Setup Time    Final    AEROBIC BOTTLE ONLY GRAM POSITIVE COCCI CRITICAL RESULT CALLED TO, READ BACK BY AND VERIFIED WITH: DAVID BESANTI AT 9147 ON 05/01/2020 [facility].    Culture (A)  Final    STAPHYLOCOCCUS SPECIES (COAGULASE NEGATIVE) THE SIGNIFICANCE OF ISOLATING THIS ORGANISM FROM A SINGLE SET OF BLOOD CULTURES WHEN MULTIPLE SETS ARE DRAWN IS UNCERTAIN. PLEASE NOTIFY THE MICROBIOLOGY DEPARTMENT WITHIN ONE WEEK IF SPECIATION AND SENSITIVITIES ARE REQUIRED. Performed at [facility], [address] [place], [place] [address]    Report Status 05/03/2020 FINAL  Final  Culture, blood (x 2)     Status: None (Preliminary result)   Collection Time: 04/30/20 12:32 AM   Specimen: BLOOD  Result Value Ref Range Status   Specimen Description BLOOD RFOA  Final   Special Requests BOTTLES DRAWN AEROBIC AND ANAEROBIC BCAV  Final   Culture   Final    NO GROWTH 3 DAYS Performed at [facility], [address], [place], [place] [address]    Report Status PENDING  Incomplete  Blood Culture ID Panel (Reflexed)     Status: Abnormal   Collection Time: 04/30/20 12:32 AM  Result Value Ref Range Status   Enterococcus species NOT DETECTED NOT DETECTED Final   Listeria monocytogenes NOT DETECTED NOT DETECTED Final   Staphylococcus species DETECTED (A) NOT DETECTED Final    Comment: Methicillin (oxacillin) susceptible coagulase negative staphylococcus. Possible blood culture contaminant (unless isolated from more than one blood culture draw or clinical case suggests pathogenicity). No antibiotic treatment is indicated for blood  culture contaminants. CRITICAL RESULT CALLED TO, READ BACK BY AND VERIFIED WITH: DAVID BESANTI AT 6578 ON 05/01/2020 [facility].    Staphylococcus aureus (BCID) NOT DETECTED NOT DETECTED Final   Methicillin resistance NOT DETECTED NOT DETECTED Final   Streptococcus species NOT DETECTED NOT DETECTED Final   Streptococcus agalactiae NOT DETECTED NOT DETECTED Final   Streptococcus pneumoniae  NOT DETECTED NOT DETECTED Final   Streptococcus pyogenes NOT DETECTED NOT DETECTED Final   Acinetobacter baumannii NOT DETECTED NOT DETECTED Final   Enterobacteriaceae species NOT DETECTED NOT DETECTED Final   Enterobacter cloacae  complex NOT DETECTED NOT DETECTED Final   Escherichia coli NOT DETECTED NOT DETECTED Final   Klebsiella oxytoca NOT DETECTED NOT DETECTED Final   Klebsiella pneumoniae NOT DETECTED NOT DETECTED Final   Proteus species NOT DETECTED NOT DETECTED Final   Serratia marcescens NOT DETECTED NOT DETECTED Final   Haemophilus influenzae NOT DETECTED NOT DETECTED Final   Neisseria meningitidis NOT DETECTED NOT DETECTED Final   Pseudomonas aeruginosa NOT DETECTED NOT DETECTED Final   Candida albicans NOT DETECTED NOT DETECTED Final   Candida glabrata NOT DETECTED NOT DETECTED Final   Candida krusei NOT DETECTED NOT DETECTED Final   Candida parapsilosis NOT DETECTED NOT DETECTED Final   Candida tropicalis NOT DETECTED NOT DETECTED Final    Comment: Performed at [facility], [address] [place], [place] [address]     Time coordinating discharge: 39 minutes  SIGNED:   Alwyn Ren, MD  [facility] 05/03/2020, 12:47 PM Pager   If 7PM-7AM, please contact night-coverage [email or web address] Password [email or web address]

## 2020-05-03 NOTE — Progress Notes (Signed)
Report given to Maryruth Hancock at [phone] with [facility]. Patient will be transferred EMS to room B10. Patient is currently fifth on list for transport.   Madie Reno, RN

## 2020-05-03 NOTE — Progress Notes (Signed)
Chad Bailey to be D/C'd Skilled nursing facility per MD order.  Discussed prescriptions and follow up appointments with the patient's family. Prescriptions given to patient, medication list explained in detail. Pt family verbalized understanding.  Allergies as of 05/03/2020   No Known Allergies     Medication List    STOP taking these medications   metFORMIN 1000 MG tablet Commonly known as: GLUCOPHAGE   oxyCODONE-acetaminophen 5-325 MG tablet Commonly known as: Roxicet   predniSONE 1 MG tablet Commonly known as: DELTASONE   timolol 0.5 % ophthalmic solution Commonly known as: TIMOPTIC     TAKE these medications   acetaminophen 500 MG tablet Commonly known as: TYLENOL Take 1,000 mg by mouth every 6 (six) hours as needed for pain.   atorvastatin 10 MG tablet Commonly known as: LIPITOR Take 10 mg by mouth daily.   ferrous sulfate 325 (65 FE) MG tablet Take 1 tablet by mouth 2 (two) times daily.   finasteride 5 MG tablet Commonly known as: PROSCAR TAKE ONE TABLET BY MOUTH ONCE DAILY   glucose blood test strip Check blood sugar twice daily as instructed. Pt has Contour Next EZ. Dx E11.319   HumaLOG 100 UNIT/ML injection Generic drug: insulin lispro Inject 7 Units into the skin 3 (three) times daily with meals.   insulin glargine 100 UNIT/ML injection Commonly known as: LANTUS Inject 0.1 mLs (10 Units total) into the skin at bedtime. Please taper off insulin Lantus by 2 units if you are tapering off prednisone, following your blood glucose levels,  Thank you What changed: how much to take   Microlet Lancets Misc USE ONE LANCET TWICE DAILY  Dx code:  E11.9   multivitamin capsule Take 1 capsule by mouth daily.   NovoFine 30G X 8 MM Misc Generic drug: Insulin Pen Needle USE ONE NEEDLE EACH TIME INSULIN IS INJECTED INTO THE SKIN   Pradaxa 150 MG Caps capsule Generic drug: dabigatran TAKE ONE CAPSULE BY MOUTH EVERY 12 HOURS What changed: See the new  instructions.   Simbrinza 1-0.2 % Susp Generic drug: Brinzolamide-Brimonidine Apply to eye 2 (two) times daily.   tamsulosin 0.4 MG Caps capsule Commonly known as: FLOMAX TAKE ONE CAPSULE BY MOUTH ONCE DAILY   timolol 0.5 % ophthalmic solution Commonly known as: BETIMOL Place 1 drop into both eyes 2 (two) times daily.   Travoprost (BAK Free) 0.004 % Soln ophthalmic solution Commonly known as: TRAVATAN Place 1 drop into both eyes every evening.   vitamin C 500 MG tablet Commonly known as: ASCORBIC ACID Take 1 tablet by mouth daily.       Vitals:   05/03/20 0408 05/03/20 1233  BP: (!) 172/58 (!) 167/84  Pulse: 60 76  Resp: 16 14  Temp: (!) 97.5 F (36.4 C) 98 F (36.7 C)  SpO2: 97% 100%    Skin clean, dry and intact without evidence of skin break down, no evidence of skin tears noted. IV catheter discontinued intact. Site without signs and symptoms of complications. Dressing and pressure applied. Pt denies pain at this time. No complaints noted.  An After Visit Summary was printed and given to the patient. Patient escorted via stretcher, and D/C [facility] via EMS.  Madie Reno RN

## 2020-05-03 NOTE — TOC Transition Note (Signed)
Transition of Care [facility]) - CM/SW Discharge Note   Patient Details  Name: Chad Bailey MRN: [id number] Date of Birth: 1929/10/29  Transition of Care [facility]) CM/SW Contact:  Chapman Fitch, RN Phone Number: 05/03/2020, 2:25 PM   Clinical Narrative:     Patient to discharge to [facility] and rehab today.  Son notified DC info sent in the [place].  Ricky at [employer] aware  Bedside RN to call report EMS packet printed  EMS called   Final next level of care: Skilled Nursing Facility Barriers to Discharge: No Barriers Identified   Patient Goals and CMS Choice        Discharge Placement              Patient chooses bed at: [facility] [place] Patient to be transferred to facility by: EMS Name of family member notified: son Patient and family notified of of transfer: 05/03/20  Discharge Plan and Services                                     Social Determinants of Health (SDOH) Interventions     Readmission Risk Interventions No flowsheet data found.

## 2020-05-05 LAB — CULTURE, BLOOD (ROUTINE X 2): Culture: NO GROWTH

## 2020-05-17 ENCOUNTER — Other Ambulatory Visit: Payer: Self-pay

## 2020-05-17 ENCOUNTER — Non-Acute Institutional Stay: Payer: Medicare Other | Admitting: Primary Care

## 2020-05-17 DIAGNOSIS — E1151 Type 2 diabetes mellitus with diabetic peripheral angiopathy without gangrene: Secondary | ICD-10-CM

## 2020-05-17 DIAGNOSIS — Z515 Encounter for palliative care: Secondary | ICD-10-CM

## 2020-05-17 NOTE — Progress Notes (Signed)
[facility] Consult Note Telephone: [phone]  Fax: [phone]  PATIENT NAME: Chad Bailey [name] [address] [phone] (home)  DOB: 1930/06/19 MRN: [id number]  PRIMARY CARE PROVIDER:    Ezequiel Kayser, MD,  [place] [address] [phone]  REFERRING PROVIDER:   Ezequiel Kayser, MD [facility] [place],  [place] [address] [phone]  RESPONSIBLE PARTY:   Extended Emergency Contact Information Primary Emergency Contact: Allen, [place] Phone: [phone] Mobile Phone: [phone] Relation: Son Secondary Emergency Contact: Moishe Spice Address: [address]          [place], [place] [address] [place] Phone: [phone] Mobile Phone: [phone] Relation: Spouse  I met face to face with patient in the facility.   ASSESSMENT AND RECOMMENDATIONS:   1. Advance Care Planning/Goals of Care: Goals include to maximize quality of life and symptom management.  T/c to son who was very suspicious of my call and role. He states he was not apprised of palliative services.Son discussed pt case with me but still would like to follow up with nursing home for verification.  2. Symptom Management:   Patient states he would like to speak to his family. I let he son know he'd like to speak with family. Pt states he feels fairly well, working on his therapy exercises and trying to eat enough. Son states he is eating better.   3. Follow up Palliative Care Visit: Palliative care will continue to follow for goals of care clarification and symptom management. Return  Prn when family decides.  4. Family /Caregiver/Community Supports: Son and wife in area,  Pt in SNF for rehab.  5. Cognitive / Functional decline:  A and O, forgetful. At SNF for rehab from loss of function.  I spent 25 minutes providing this consultation,  from 1600 to  1625. More than 50% of the time in this consultation was spent coordinating communication.   CHIEF COMPLAINT: Debility  HISTORY OF PRESENT ILLNESS:  Chad Bailey is a 86 y.o. year old male with multiple medical problems including blindness, arteritis, debility, DM. Palliative Care was asked to follow this patient by consultation request of Ezequiel Kayser, MD to help address advance care planning and goals of care. This is a follow up visit.  CODE STATUS: TBD  PPS: 40%  HOSPICE ELIGIBILITY/DIAGNOSIS: TBD  PAST MEDICAL HISTORY:  Past Medical History:  Diagnosis Date  . Blindness   . Dysuria   . Fracture of left ankle   . Giant cell arteritis ([place])   . gouty arthritis   . History of tobacco abuse   . Hyperlipidemia   . Hypertension   . Loss of vision 01/2013   left eye  . Non-insulin dependent diabetes mellitus   . PAF (paroxysmal atrial fibrillation) (H[facility])    a. on pradaxa; b. CHADS2VASc at least 7 (HTN, age x 2, DM, stroke, vascular disease)  . Retinal artery thrombosis, left 2014  . Thyroid nodule 2014  . Type 2 diabetes mellitus ([place])     SOCIAL HX:  Social History   Tobacco Use  . Smoking status: Former Smoker    Packs/day: 6.00    Years: 56.00    Pack years: 336.00    Types: Cigarettes, Pipe    Quit date: 10/05/2002    Years since quitting: 17.6  . Smokeless tobacco: Former Systems developer    Types: Chew  . Tobacco  comment: smoked 6 pipes/day;smoked cigarettes 1/2 PPD ,quit chewing tobacco in 2004  Substance Use Topics  . Alcohol use: No   FAMILY HX:  Family History  Problem Relation Age of Onset  . Heart disease Mother   . Tuberculosis Mother   . Lung cancer Father   . Cancer Maternal Grandmother        bowel  . Kidney disease Neg Hx   . Prostate cancer Neg Hx     ALLERGIES: No Known Allergies   PERTINENT MEDICATIONS:  Outpatient Encounter Medications as of 05/17/2020  Medication Sig  . acetaminophen (TYLENOL) 500 MG tablet Take 1,000 mg by mouth every 6 (six)  hours as needed for pain.  Marland Kitchen atorvastatin (LIPITOR) 10 MG tablet Take 10 mg by mouth daily.   . Brinzolamide-Brimonidine (SIMBRINZA) 1-0.2 % SUSP Apply to eye 2 (two) times daily.  . ferrous sulfate 325 (65 FE) MG tablet Take 1 tablet by mouth 2 (two) times daily.  . finasteride (PROSCAR) 5 MG tablet TAKE ONE TABLET BY MOUTH ONCE DAILY (Patient taking differently: Take 5 mg by mouth daily. )  . glucose blood test strip Check blood sugar twice daily as instructed. Pt has Contour Next EZ. Dx N39.767  . HUMALOG 100 UNIT/ML injection Inject 7 Units into the skin 3 (three) times daily with meals.   . insulin glargine (LANTUS) 100 UNIT/ML injection Inject 0.1 mLs (10 Units total) into the skin at bedtime. Please taper off insulin Lantus by 2 units if you are tapering off prednisone, following your blood glucose levels,  Thank you (Patient taking differently: Inject 13 Units into the skin at bedtime. Please taper off insulin Lantus by 2 units if you are tapering off prednisone, following your blood glucose levels,  Thank you)  . MICROLET LANCETS MISC USE ONE LANCET TWICE DAILY  Dx code:  E11.9  . Multiple Vitamin (MULTIVITAMIN) capsule Take 1 capsule by mouth daily.  Marland Kitchen NOVOFINE 30G X 8 MM MISC USE ONE NEEDLE EACH TIME INSULIN IS INJECTED INTO THE SKIN  . PRADAXA 150 MG CAPS capsule TAKE ONE CAPSULE BY MOUTH EVERY 12 HOURS (Patient taking differently: Take 150 mg by mouth 2 (two) times daily. )  . tamsulosin (FLOMAX) 0.4 MG CAPS capsule TAKE ONE CAPSULE BY MOUTH ONCE DAILY (Patient taking differently: Take 0.4 mg by mouth daily. )  . timolol (BETIMOL) 0.5 % ophthalmic solution Place 1 drop into both eyes 2 (two) times daily.   . Travoprost, BAK Free, (TRAVATAN) 0.004 % SOLN ophthalmic solution Place 1 drop into both eyes every evening.  . vitamin C (ASCORBIC ACID) 500 MG tablet Take 1 tablet by mouth daily.   No facility-administered encounter medications on file as of 05/17/2020.    PHYSICAL EXAM / ROS:     Current and past weights: unavailable General: NAD, frail appearing, thin Cardiovascular: S1,S2, no chest pain reported, no  LE edema  Pulmonary: CTA, no cough, no increased SOB, room air Abdomen: appetite fair and improving, endorses constipation, incontinent of bowel GU: denies dysuria, incontinent of urine MSK:  ++ joint and ROM abnormalities, ambulatory with walker, undergoing PT Skin: no rashes or wounds reported Neurological: Weakness, blind, able to hear, denies pain, insomnia  Jason Coop, NP , DNP, MPH, [facility]  COVID-19 PATIENT SCREENING TOOL  Person answering questions: ___________Staff_____ _____   1.  Is the patient or any family member in the home showing any signs or symptoms regarding respiratory infection?  Person with Symptom- __________NA_________________  a. Fever                                                                          Yes___ No___          ___________________  b. Shortness of breath                                                    Yes___ No___          ___________________ c. Cough/congestion                                       Yes___  No___         ___________________ d. Body aches/pains                                                         Yes___ No___        ____________________ e. Gastrointestinal symptoms (diarrhea, nausea)           Yes___ No___        ____________________  2. Within the past 14 days, has anyone living in the home had any contact with someone with or under investigation for COVID-19?    Yes___ No_X_   Person __________________   

## 2020-06-03 ENCOUNTER — Emergency Department: Payer: Medicare Other

## 2020-06-03 ENCOUNTER — Inpatient Hospital Stay: Payer: Medicare Other

## 2020-06-03 ENCOUNTER — Encounter: Payer: Self-pay | Admitting: Emergency Medicine

## 2020-06-03 ENCOUNTER — Inpatient Hospital Stay
Admission: EM | Admit: 2020-06-03 | Discharge: 2020-06-07 | DRG: 698 | Disposition: A | Discharge status: Home Health | Payer: Medicare Other | Attending: Family Medicine | Admitting: Family Medicine

## 2020-06-03 ENCOUNTER — Other Ambulatory Visit: Payer: Self-pay

## 2020-06-03 DIAGNOSIS — H543 Unqualified visual loss, both eyes: Secondary | ICD-10-CM | POA: Diagnosis present

## 2020-06-03 DIAGNOSIS — Z7982 Long term (current) use of aspirin: Secondary | ICD-10-CM

## 2020-06-03 DIAGNOSIS — G9341 Metabolic encephalopathy: Secondary | ICD-10-CM | POA: Diagnosis present

## 2020-06-03 DIAGNOSIS — G459 Transient cerebral ischemic attack, unspecified: Secondary | ICD-10-CM | POA: Diagnosis present

## 2020-06-03 DIAGNOSIS — Z79899 Other long term (current) drug therapy: Secondary | ICD-10-CM

## 2020-06-03 DIAGNOSIS — R338 Other retention of urine: Secondary | ICD-10-CM | POA: Diagnosis present

## 2020-06-03 DIAGNOSIS — Z8673 Personal history of transient ischemic attack (TIA), and cerebral infarction without residual deficits: Secondary | ICD-10-CM

## 2020-06-03 DIAGNOSIS — F17200 Nicotine dependence, unspecified, uncomplicated: Secondary | ICD-10-CM | POA: Diagnosis present

## 2020-06-03 DIAGNOSIS — Y846 Urinary catheterization as the cause of abnormal reaction of the patient, or of later complication, without mention of misadventure at the time of the procedure: Secondary | ICD-10-CM | POA: Diagnosis present

## 2020-06-03 DIAGNOSIS — Z7189 Other specified counseling: Secondary | ICD-10-CM | POA: Diagnosis not present

## 2020-06-03 DIAGNOSIS — Z831 Family history of other infectious and parasitic diseases: Secondary | ICD-10-CM

## 2020-06-03 DIAGNOSIS — Z20822 Contact with and (suspected) exposure to covid-19: Secondary | ICD-10-CM | POA: Diagnosis present

## 2020-06-03 DIAGNOSIS — E43 Unspecified severe protein-calorie malnutrition: Secondary | ICD-10-CM | POA: Diagnosis present

## 2020-06-03 DIAGNOSIS — Z801 Family history of malignant neoplasm of trachea, bronchus and lung: Secondary | ICD-10-CM

## 2020-06-03 DIAGNOSIS — E1151 Type 2 diabetes mellitus with diabetic peripheral angiopathy without gangrene: Secondary | ICD-10-CM | POA: Diagnosis present

## 2020-06-03 DIAGNOSIS — I482 Chronic atrial fibrillation, unspecified: Secondary | ICD-10-CM | POA: Diagnosis present

## 2020-06-03 DIAGNOSIS — M109 Gout, unspecified: Secondary | ICD-10-CM | POA: Diagnosis present

## 2020-06-03 DIAGNOSIS — N179 Acute kidney failure, unspecified: Secondary | ICD-10-CM | POA: Diagnosis present

## 2020-06-03 DIAGNOSIS — N12 Tubulo-interstitial nephritis, not specified as acute or chronic: Secondary | ICD-10-CM

## 2020-06-03 DIAGNOSIS — J9811 Atelectasis: Secondary | ICD-10-CM | POA: Diagnosis present

## 2020-06-03 DIAGNOSIS — Z6821 Body mass index (BMI) 21.0-21.9, adult: Secondary | ICD-10-CM

## 2020-06-03 DIAGNOSIS — Z8249 Family history of ischemic heart disease and other diseases of the circulatory system: Secondary | ICD-10-CM

## 2020-06-03 DIAGNOSIS — E279 Disorder of adrenal gland, unspecified: Secondary | ICD-10-CM | POA: Diagnosis present

## 2020-06-03 DIAGNOSIS — Z794 Long term (current) use of insulin: Secondary | ICD-10-CM

## 2020-06-03 DIAGNOSIS — D72828 Other elevated white blood cell count: Secondary | ICD-10-CM

## 2020-06-03 DIAGNOSIS — M316 Other giant cell arteritis: Secondary | ICD-10-CM | POA: Diagnosis present

## 2020-06-03 DIAGNOSIS — I480 Paroxysmal atrial fibrillation: Secondary | ICD-10-CM | POA: Diagnosis present

## 2020-06-03 DIAGNOSIS — H409 Unspecified glaucoma: Secondary | ICD-10-CM | POA: Diagnosis present

## 2020-06-03 DIAGNOSIS — B9689 Other specified bacterial agents as the cause of diseases classified elsewhere: Secondary | ICD-10-CM | POA: Diagnosis present

## 2020-06-03 DIAGNOSIS — N401 Enlarged prostate with lower urinary tract symptoms: Secondary | ICD-10-CM | POA: Diagnosis present

## 2020-06-03 DIAGNOSIS — T83518A Infection and inflammatory reaction due to other urinary catheter, initial encounter: Principal | ICD-10-CM | POA: Diagnosis present

## 2020-06-03 DIAGNOSIS — Z7901 Long term (current) use of anticoagulants: Secondary | ICD-10-CM

## 2020-06-03 DIAGNOSIS — D729 Disorder of white blood cells, unspecified: Secondary | ICD-10-CM

## 2020-06-03 DIAGNOSIS — E785 Hyperlipidemia, unspecified: Secondary | ICD-10-CM | POA: Diagnosis present

## 2020-06-03 DIAGNOSIS — E871 Hypo-osmolality and hyponatremia: Secondary | ICD-10-CM | POA: Diagnosis present

## 2020-06-03 DIAGNOSIS — R5383 Other fatigue: Secondary | ICD-10-CM

## 2020-06-03 DIAGNOSIS — I10 Essential (primary) hypertension: Secondary | ICD-10-CM | POA: Diagnosis present

## 2020-06-03 DIAGNOSIS — R627 Adult failure to thrive: Secondary | ICD-10-CM

## 2020-06-03 DIAGNOSIS — E860 Dehydration: Secondary | ICD-10-CM | POA: Diagnosis present

## 2020-06-03 DIAGNOSIS — E872 Acidosis, unspecified: Secondary | ICD-10-CM

## 2020-06-03 DIAGNOSIS — Z66 Do not resuscitate: Secondary | ICD-10-CM | POA: Diagnosis not present

## 2020-06-03 DIAGNOSIS — E861 Hypovolemia: Secondary | ICD-10-CM | POA: Diagnosis present

## 2020-06-03 DIAGNOSIS — I639 Cerebral infarction, unspecified: Secondary | ICD-10-CM | POA: Diagnosis present

## 2020-06-03 DIAGNOSIS — Z515 Encounter for palliative care: Secondary | ICD-10-CM | POA: Diagnosis not present

## 2020-06-03 DIAGNOSIS — E1165 Type 2 diabetes mellitus with hyperglycemia: Secondary | ICD-10-CM | POA: Diagnosis present

## 2020-06-03 DIAGNOSIS — R531 Weakness: Secondary | ICD-10-CM | POA: Diagnosis present

## 2020-06-03 LAB — COMPREHENSIVE METABOLIC PANEL
ALT: 38 U/L (ref 0–44)
AST: 58 U/L — ABNORMAL HIGH (ref 15–41)
Albumin: 2.8 g/dL — ABNORMAL LOW (ref 3.5–5.0)
Alkaline Phosphatase: 97 U/L (ref 38–126)
Anion gap: 12 (ref 5–15)
BUN: 38 mg/dL — ABNORMAL HIGH (ref 8–23)
CO2: 25 mmol/L (ref 22–32)
Calcium: 9.5 mg/dL (ref 8.9–10.3)
Chloride: 89 mmol/L — ABNORMAL LOW (ref 98–111)
Creatinine, Ser: 1.25 mg/dL — ABNORMAL HIGH (ref 0.61–1.24)
GFR calc Af Amer: 59 mL/min — ABNORMAL LOW (ref >60)
GFR calc non Af Amer: 51 mL/min — ABNORMAL LOW (ref >60)
Glucose, Bld: 199 mg/dL — ABNORMAL HIGH (ref 70–99)
Potassium: 3.9 mmol/L (ref 3.5–5.1)
Sodium: 126 mmol/L — ABNORMAL LOW (ref 135–145)
Total Bilirubin: 1.2 mg/dL (ref 0.3–1.2)
Total Protein: 6.6 g/dL (ref 6.5–8.1)

## 2020-06-03 LAB — URINALYSIS, COMPLETE (UACMP) WITH MICROSCOPIC
Bilirubin Urine: NEGATIVE
Glucose, UA: NEGATIVE mg/dL
Ketones, ur: NEGATIVE mg/dL
Nitrite: NEGATIVE
Protein, ur: 30 mg/dL — AB
Specific Gravity, Urine: 1.011 (ref 1.005–1.030)
WBC, UA: >50 WBC/hpf — ABNORMAL HIGH (ref 0–5)
pH: 5 (ref 5.0–8.0)

## 2020-06-03 LAB — CBC WITH DIFFERENTIAL/PLATELET
Abs Immature Granulocytes: 0.31 10*3/uL — ABNORMAL HIGH (ref 0.00–0.07)
Basophils Absolute: 0 10*3/uL (ref 0.0–0.1)
Basophils Relative: 0 %
Eosinophils Absolute: 0 10*3/uL (ref 0.0–0.5)
Eosinophils Relative: 0 %
HCT: 40.3 % (ref 39.0–52.0)
Hemoglobin: 13.9 g/dL (ref 13.0–17.0)
Immature Granulocytes: 2 %
Lymphocytes Relative: 7 %
Lymphs Abs: 1.4 10*3/uL (ref 0.7–4.0)
MCH: 30.3 pg (ref 26.0–34.0)
MCHC: 34.5 g/dL (ref 30.0–36.0)
MCV: 87.8 fL (ref 80.0–100.0)
Monocytes Absolute: 2.1 10*3/uL — ABNORMAL HIGH (ref 0.1–1.0)
Monocytes Relative: 12 %
Neutro Abs: 14.5 10*3/uL — ABNORMAL HIGH (ref 1.7–7.7)
Neutrophils Relative %: 79 %
Platelets: 337 10*3/uL (ref 150–400)
RBC: 4.59 MIL/uL (ref 4.22–5.81)
RDW: 13.3 % (ref 11.5–15.5)
WBC: 18.3 10*3/uL — ABNORMAL HIGH (ref 4.0–10.5)
nRBC: 0 % (ref 0.0–0.2)

## 2020-06-03 LAB — BASIC METABOLIC PANEL
Anion gap: 14 (ref 5–15)
BUN: 35 mg/dL — ABNORMAL HIGH (ref 8–23)
CO2: 23 mmol/L (ref 22–32)
Calcium: 9.7 mg/dL (ref 8.9–10.3)
Chloride: 90 mmol/L — ABNORMAL LOW (ref 98–111)
Creatinine, Ser: 1.11 mg/dL (ref 0.61–1.24)
GFR calc Af Amer: >60 mL/min (ref >60)
GFR calc non Af Amer: 59 mL/min — ABNORMAL LOW (ref >60)
Glucose, Bld: 223 mg/dL — ABNORMAL HIGH (ref 70–99)
Potassium: 3.7 mmol/L (ref 3.5–5.1)
Sodium: 127 mmol/L — ABNORMAL LOW (ref 135–145)

## 2020-06-03 LAB — LACTIC ACID, PLASMA: Lactic Acid, Venous: 2.1 mmol/L (ref 0.5–1.9)

## 2020-06-03 LAB — GLUCOSE, CAPILLARY
Glucose-Capillary: 193 mg/dL — ABNORMAL HIGH (ref 70–99)
Glucose-Capillary: 219 mg/dL — ABNORMAL HIGH (ref 70–99)

## 2020-06-03 LAB — MAGNESIUM: Magnesium: 2 mg/dL (ref 1.7–2.4)

## 2020-06-03 LAB — PROTIME-INR
INR: 1.7 — ABNORMAL HIGH (ref 0.8–1.2)
Prothrombin Time: 19.4 seconds — ABNORMAL HIGH (ref 11.4–15.2)

## 2020-06-03 LAB — APTT: aPTT: 59 seconds — ABNORMAL HIGH (ref 24–36)

## 2020-06-03 LAB — SARS CORONAVIRUS 2 BY RT PCR (HOSPITAL ORDER, PERFORMED IN ~~LOC~~ HOSPITAL LAB): SARS Coronavirus 2: NEGATIVE

## 2020-06-03 MED ORDER — SODIUM CHLORIDE 0.9 % IV SOLN
2.0000 g | Freq: Once | INTRAVENOUS | Status: AC
  Administered 2020-06-03: 2 g via INTRAVENOUS
  Filled 2020-06-03: qty 20

## 2020-06-03 MED ORDER — SODIUM CHLORIDE 0.9 % IV SOLN: INTRAVENOUS | Status: DC

## 2020-06-03 MED ORDER — NYSTATIN 100000 UNIT/GM EX POWD
1.0000 "application " | Freq: Two times a day (BID) | CUTANEOUS | Status: DC
  Administered 2020-06-03 – 2020-06-07 (×8): 1 via TOPICAL
  Filled 2020-06-03: qty 15

## 2020-06-03 MED ORDER — IOHEXOL 300 MG/ML  SOLN
100.0000 mL | Freq: Once | INTRAMUSCULAR | Status: AC | PRN
  Administered 2020-06-03: 100 mL via INTRAVENOUS

## 2020-06-03 MED ORDER — ACETAMINOPHEN 325 MG PO TABS
650.0000 mg | ORAL_TABLET | ORAL | Status: DC | PRN
  Administered 2020-06-05: 650 mg via ORAL
  Filled 2020-06-03: qty 2

## 2020-06-03 MED ORDER — ASCORBIC ACID 500 MG PO TABS: 500.0000 mg | ORAL_TABLET | Freq: Every day | ORAL | Status: DC

## 2020-06-03 MED ORDER — FERROUS SULFATE 325 (65 FE) MG PO TABS: 325.0000 mg | ORAL_TABLET | Freq: Every day | ORAL | Status: DC

## 2020-06-03 MED ORDER — SODIUM CHLORIDE 0.9 % IV SOLN
2.0000 g | Freq: Two times a day (BID) | INTRAVENOUS | Status: DC
  Administered 2020-06-03: 2 g via INTRAVENOUS
  Filled 2020-06-03 (×3): qty 2

## 2020-06-03 MED ORDER — INSULIN ASPART 100 UNIT/ML ~~LOC~~ SOLN
0.0000 [IU] | SUBCUTANEOUS | Status: DC
  Administered 2020-06-03 – 2020-06-04 (×3): 3 [IU] via SUBCUTANEOUS
  Administered 2020-06-04: 2 [IU] via SUBCUTANEOUS
  Administered 2020-06-04: 3 [IU] via SUBCUTANEOUS
  Administered 2020-06-04: 5 [IU] via SUBCUTANEOUS
  Administered 2020-06-05 (×3): 2 [IU] via SUBCUTANEOUS
  Administered 2020-06-05: 3 [IU] via SUBCUTANEOUS
  Administered 2020-06-06: 1 [IU] via SUBCUTANEOUS
  Administered 2020-06-06: 3 [IU] via SUBCUTANEOUS
  Administered 2020-06-06 – 2020-06-07 (×3): 2 [IU] via SUBCUTANEOUS
  Filled 2020-06-03 (×14): qty 1

## 2020-06-03 MED ORDER — LATANOPROST 0.005 % OP SOLN
1.0000 [drp] | Freq: Every day | OPHTHALMIC | Status: DC
  Administered 2020-06-03 – 2020-06-06 (×4): 1 [drp] via OPHTHALMIC
  Filled 2020-06-03: qty 2.5

## 2020-06-03 MED ORDER — BRIMONIDINE TARTRATE 0.2 % OP SOLN
1.0000 [drp] | Freq: Three times a day (TID) | OPHTHALMIC | Status: DC
  Administered 2020-06-03 – 2020-06-07 (×11): 1 [drp] via OPHTHALMIC
  Filled 2020-06-03: qty 5

## 2020-06-03 MED ORDER — ASPIRIN EC 325 MG PO TBEC: 325.0000 mg | DELAYED_RELEASE_TABLET | Freq: Every day | ORAL | Status: DC

## 2020-06-03 MED ORDER — ATORVASTATIN CALCIUM 10 MG PO TABS
10.0000 mg | ORAL_TABLET | Freq: Every day | ORAL | Status: DC
  Administered 2020-06-05 – 2020-06-07 (×3): 10 mg via ORAL
  Filled 2020-06-03 (×3): qty 1

## 2020-06-03 MED ORDER — ASPIRIN 300 MG RE SUPP
300.0000 mg | Freq: Every day | RECTAL | Status: DC
  Filled 2020-06-03: qty 1

## 2020-06-03 MED ORDER — ACETAMINOPHEN 650 MG RE SUPP: 650.0000 mg | RECTAL | Status: DC | PRN

## 2020-06-03 MED ORDER — BRINZOLAMIDE 1 % OP SUSP
1.0000 [drp] | Freq: Three times a day (TID) | OPHTHALMIC | Status: DC
  Administered 2020-06-03 – 2020-06-07 (×11): 1 [drp] via OPHTHALMIC
  Filled 2020-06-03: qty 10

## 2020-06-03 MED ORDER — TIMOLOL MALEATE 0.5 % OP SOLN
1.0000 [drp] | Freq: Two times a day (BID) | OPHTHALMIC | Status: DC
  Administered 2020-06-04 – 2020-06-07 (×7): 1 [drp] via OPHTHALMIC
  Filled 2020-06-03 (×2): qty 5

## 2020-06-03 MED ORDER — FINASTERIDE 5 MG PO TABS
5.0000 mg | ORAL_TABLET | Freq: Every day | ORAL | Status: DC
  Administered 2020-06-05 – 2020-06-07 (×3): 5 mg via ORAL
  Filled 2020-06-03 (×3): qty 1

## 2020-06-03 MED ORDER — STROKE: EARLY STAGES OF RECOVERY BOOK: Freq: Once | Status: DC

## 2020-06-03 MED ORDER — TAMSULOSIN HCL 0.4 MG PO CAPS
0.4000 mg | ORAL_CAPSULE | Freq: Every day | ORAL | Status: DC
  Administered 2020-06-05 – 2020-06-07 (×3): 0.4 mg via ORAL
  Filled 2020-06-03 (×3): qty 1

## 2020-06-03 MED ORDER — ACETAMINOPHEN 160 MG/5ML PO SOLN: 650.0000 mg | ORAL | Status: DC | PRN

## 2020-06-03 MED ORDER — CLOTRIMAZOLE 1 % EX CREA
1.0000 "application " | TOPICAL_CREAM | Freq: Two times a day (BID) | CUTANEOUS | Status: DC
  Administered 2020-06-03 – 2020-06-07 (×8): 1 via TOPICAL
  Filled 2020-06-03: qty 15

## 2020-06-03 MED ORDER — DABIGATRAN ETEXILATE MESYLATE 150 MG PO CAPS
150.0000 mg | ORAL_CAPSULE | Freq: Two times a day (BID) | ORAL | Status: DC
  Administered 2020-06-05 – 2020-06-07 (×5): 150 mg via ORAL
  Filled 2020-06-03 (×8): qty 1

## 2020-06-03 NOTE — ED Notes (Signed)
MRI speaking with grandson

## 2020-06-03 NOTE — H&P (Signed)
[facility] History and Physical   Patient: Chad McburneyWalter P Topping ZOX:[id number]   PCP: Mickey Farberhies, David, MD DOB: 05/26/1930   DOA: 06/03/2020   DOS: 06/03/2020   DOS: the patient was seen and examined on 06/03/2020  Patient coming from: The patient is coming from Home  Chief Complaint: Confusion and difficulty ambulation  HPI: Chad McburneyWalter P Bailey is a 86 y.o. 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 fib on Pradaxa, history of CVA, abdominal thrombosis, urinary retention, BPH.  Patient was brought in secondary to confusion and left-sided weakness. Patient was recently hospitalized for UTI, had urinary retention.  Was discharged to SNF on 05/03/2020.  Patient was discharged from SNF to home on 05/31/2020. His Foley catheter was removed on 05/30/2020. After going home patient had poor p.o. intake and fatigue and tiredness. Patient has progressive decline in his mental status as well. Unable to walk or stand as before. Very hard to get him to drink and hard to get him to wake up long enough to eat. He will fall sleep during a meal, even while chewing, then not swallow his food. He "sleeps all the time" according to his grandson. Patient went to see his PCPs office for regular follow-up from where patient was sent to neurology due to perceived left-sided weakness. Per his grandson who is EMS reports that patient has been slumped over has been also reporting left-sided weakness in both upper and lower extremity as well as numbness. Grandson performed a fast examination but did not find anything significant. Reportedly patient was supposed to get an MRI outpatient but then was sent to ER for further work-up. At the time of my evaluation patient denies having any complaints of headache, chest pain, abdominal pain.  Patient reports bilateral leg pain. No diarrhea reported.  No vomiting since  arrival but reports nausea.  ED Course: On arrival patient found to have lethargy.  CT head unremarkable.  Patient had suprapubic tenderness.  In and out catheterization showed 1400 mL of urinary retention.  CT abdomen pelvis was performed which showed pyelonephritis.  Patient was referred for admission.  At his baseline ambulates with assistance dependent for most of his ADL;  Does not manages his medication on his own.  Review of Systems: as mentioned in the history of present illness.  All other systems reviewed and are negative.  Past Medical History:  Diagnosis Date  . Blindness   . Dysuria   . Fracture of left ankle   . Giant cell arteritis (HCC)   . gouty arthritis   . History of tobacco abuse   . Hyperlipidemia   . Hypertension   . Loss of vision 01/2013   left eye  . Non-insulin dependent diabetes mellitus   . PAF (paroxysmal atrial fibrillation) (HCC)    a. on pradaxa; b. CHADS2VASc at least 7 (HTN, age x 2, DM, stroke, vascular disease)  . Retinal artery thrombosis, left 2014  . Thyroid nodule 2014  . Type 2 diabetes mellitus (HCC)    Past Surgical History:  Procedure Laterality Date  . EYE SURGERY     right  . LASIK     bilateral eye surgery  . left ankle   1996   s/p surgery with screws   Social History:  reports that he quit smoking about 17 years ago. His smoking use included cigarettes and pipe. He has a 336.00 pack-year smoking history. He has quit  using smokeless tobacco.  His smokeless tobacco use included chew. He reports that he does not drink alcohol and does not use drugs.  No Known Allergies  Family history reviewed and not pertinent Family History  Problem Relation Age of Onset  . Heart disease Mother   . Tuberculosis Mother   . Lung cancer Father   . Cancer Maternal Grandmother        bowel  . Kidney disease Neg Hx   . Prostate cancer Neg Hx    Prior to Admission medications   Medication Sig Start Date End Date Taking? Authorizing  Provider  acetaminophen (TYLENOL) 500 MG tablet Take 1,000 mg by mouth every 6 (six) hours as needed for pain.   Yes [provider]  aspirin EC 81 MG tablet Take 81 mg by mouth daily. Swallow whole.   Yes [provider]  atorvastatin (LIPITOR) 10 MG tablet Take 10 mg by mouth daily.  09/30/16  Yes [provider]  Brinzolamide-Brimonidine (SIMBRINZA) 1-0.2 % SUSP Place 1 drop into both eyes 2 (two) times daily.    Yes [provider]  Calcium Carbonate-Vitamin D3 600-400 MG-UNIT TABS Take 1 tablet by mouth in the morning and at bedtime.   Yes [provider]  Cholecalciferol (D 1000) 25 MCG (1000 UT) capsule Take 1,000 Units by mouth daily.   Yes [provider]  clotrimazole (LOTRIMIN) 1 % cream Apply 1 application topically 2 (two) times daily.   Yes [provider]  ferrous sulfate 325 (65 FE) MG tablet Take 1 tablet by mouth daily with breakfast.    Yes [provider]  finasteride (PROSCAR) 5 MG tablet TAKE ONE TABLET BY MOUTH ONCE DAILY Patient taking differently: Take 5 mg by mouth daily.  05/29/15  Yes Sherlene Shams, MD  HUMALOG 100 UNIT/ML injection Inject 14-16 Units into the skin 3 (three) times daily with meals.  08/30/15  Yes [provider]  insulin glargine (LANTUS) 100 UNIT/ML injection Inject 0.1 mLs (10 Units total) into the skin at bedtime. Please taper off insulin Lantus by 2 units if you are tapering off prednisone, following your blood glucose levels,  Thank you Patient taking differently: Inject 18-19 Units into the skin at bedtime.  10/07/16  Yes Katharina Caper, MD  Multiple Vitamin (MULTIVITAMIN) capsule Take 1 capsule by mouth daily.   Yes [provider]  nystatin (NYSTATIN) powder Apply 1 application topically 2 (two) times daily.   Yes [provider]  PRADAXA 150 MG CAPS capsule TAKE ONE CAPSULE BY MOUTH EVERY 12 HOURS Patient taking differently: Take 150 mg by mouth 2  (two) times daily.  05/28/15  Yes Antonieta Iba, MD  senna (SENOKOT) 8.6 MG tablet Take 2 tablets by mouth daily as needed for constipation.   Yes [provider]  tamsulosin (FLOMAX) 0.4 MG CAPS capsule TAKE ONE CAPSULE BY MOUTH ONCE DAILY Patient taking differently: Take 0.4 mg by mouth daily.  08/21/15  Yes Sherlene Shams, MD  timolol (BETIMOL) 0.5 % ophthalmic solution Place 1 drop into both eyes 2 (two) times daily.    Yes [provider]  Travoprost, BAK Free, (TRAVATAN) 0.004 % SOLN ophthalmic solution Place 1 drop into both eyes every evening. 02/12/20  Yes [provider]  vitamin C (ASCORBIC ACID) 500 MG tablet Take 1 tablet by mouth daily.   Yes [provider]  MICROLET LANCETS MISC USE ONE LANCET TWICE DAILY  Dx code:  E11.9 05/30/15   Darrick Huntsman,  Mar Daring, MD   Physical Exam: Vitals:   06/03/20 1500 06/03/20 1630 06/03/20 1700 06/03/20 1730  BP: (!) 143/84 (!) 135/93 (!) 146/73 (!) 166/105  Pulse:  76 79 79  Resp: 16 15 15 17   Temp:      TempSrc:      SpO2: 97% 96% 95% 96%  Weight:       General: alert and oriented to time and place. Appear in mild distress, affect flat in affect Eyes:  Conjunctiva normal, bilateral blindness ENT: Poor dentition, oral Mucosa Clear, dry  Neck: difficult to assess  JVD, no Abnormal Mass Or lumps Cardiovascular: S1 and S2 Present, no Murmur, peripheral pulses symmetrical Respiratory: good respiratory effort, Bilateral Air entry equal and Decreased, no signs of accessory muscle use, faint Crackles, no wheezes Abdomen: Bowel Sound present, Soft and no tenderness, no hernia Skin: no rashes  Extremities: no Pedal edema, no calf tenderness Neurologic: mental status, alert and oriented x3, generalized weakness without any focal deficit, speech has mild dysarthria, Sensation grossly normal to light touch and Asterixis absent Gait not checked due to patient safety concerns  Data Reviewed: I have personally reviewed  and interpreted labs, imaging as discussed below.  CBC: Recent Labs  Lab 06/03/20 1406  WBC 18.3*  NEUTROABS 14.5*  HGB 13.9  HCT 40.3  MCV 87.8  PLT 337   Basic Metabolic Panel: Recent Labs  Lab 06/03/20 1406  NA 126*  K 3.9  CL 89*  CO2 25  GLUCOSE 199*  BUN 38*  CREATININE 1.25*  CALCIUM 9.5  MG 2.0   GFR: Estimated Creatinine Clearance: 40.9 mL/min (A) (by C-G formula based on SCr of 1.25 mg/dL (H)). Liver Function Tests: Recent Labs  Lab 06/03/20 1406  AST 58*  ALT 38  ALKPHOS 97  BILITOT 1.2  PROT 6.6  ALBUMIN 2.8*   No results for input(s): LIPASE, AMYLASE in the last 168 hours. No results for input(s): AMMONIA in the last 168 hours. Coagulation Profile: Recent Labs  Lab 06/03/20 1406  INR 1.7*   Cardiac Enzymes: No results for input(s): CKTOTAL, CKMB, CKMBINDEX, TROPONINI in the last 168 hours. BNP (last 3 results) No results for input(s): PROBNP in the last 8760 hours. HbA1C: No results for input(s): HGBA1C in the last 72 hours. CBG: Recent Labs  Lab 06/03/20 1417  GLUCAP 193*   Lipid Profile: No results for input(s): CHOL, HDL, LDLCALC, TRIG, CHOLHDL, LDLDIRECT in the last 72 hours. Thyroid Function Tests: No results for input(s): TSH, T4TOTAL, FREET4, T3FREE, THYROIDAB in the last 72 hours. Anemia Panel: No results for input(s): VITAMINB12, FOLATE, FERRITIN, TIBC, IRON, RETICCTPCT in the last 72 hours. Urine analysis:    Component Value Date/Time   COLORURINE YELLOW (A) 06/03/2020 1418   APPEARANCEUR CLOUDY (A) 06/03/2020 1418   APPEARANCEUR Clear 11/01/2015 1504   LABSPEC 1.011 06/03/2020 1418   PHURINE 5.0 06/03/2020 1418   GLUCOSEU NEGATIVE 06/03/2020 1418   GLUCOSEU NEGATIVE 05/23/2014 1343   HGBUR SMALL (A) 06/03/2020 1418   BILIRUBINUR NEGATIVE 06/03/2020 1418   BILIRUBINUR Negative 11/01/2015 1504   KETONESUR NEGATIVE 06/03/2020 1418   PROTEINUR 30 (A) 06/03/2020 1418   UROBILINOGEN 0.2 05/23/2014 1343   NITRITE  NEGATIVE 06/03/2020 1418   LEUKOCYTESUR MODERATE (A) 06/03/2020 1418    Radiological Exams on Admission: DG Chest 1 View  Result Date: 06/03/2020 CLINICAL DATA:  Altered mental status and weakness. EXAM: CHEST  1 VIEW COMPARISON:  04/29/2020 and prior radiographs FINDINGS: This is a low volume  film with the patient leaning and rotated to the RIGHT. Mild RIGHT basilar atelectasis noted. The cardiomediastinal silhouette is unchanged. No airspace disease, pleural effusion, pneumothorax or acute bony abnormality. IMPRESSION: Mild RIGHT basilar atelectasis. Electronically Signed   By: Harmon Pier M.D.   On: 06/03/2020 14:23   CT Head Wo Contrast  Result Date: 06/03/2020 CLINICAL DATA:  Left hand and left leg numbness with headache. EXAM: CT HEAD WITHOUT CONTRAST TECHNIQUE: Contiguous axial images were obtained from the base of the skull through the vertex without intravenous contrast. COMPARISON:  April 29, 2020 FINDINGS: Brain: There is mild cerebral atrophy with widening of the extra-axial spaces and ventricular dilatation. There are areas of decreased attenuation within the white matter tracts of the supratentorial brain, consistent with microvascular disease changes. A small area of cortical encephalomalacia, with adjacent chronic white matter low attenuation, is again seen within the right frontal lobe. Vascular: No hyperdense vessel or unexpected calcification. Skull: Normal. Negative for fracture or focal lesion. Sinuses/Orbits: No acute finding. Other: None. IMPRESSION: 1. Generalized cerebral atrophy. 2. Chronic right frontal lobe infarct. 3. No acute intracranial abnormality. Electronically Signed   By: Aram Candela M.D.   On: 06/03/2020 15:10   CT ABDOMEN PELVIS W CONTRAST  Result Date: 06/03/2020 CLINICAL DATA:  Progressive weakness, left side EXAM: CT ABDOMEN AND PELVIS WITH CONTRAST TECHNIQUE: Multidetector CT imaging of the abdomen and pelvis was performed using the standard protocol  following bolus administration of intravenous contrast. CONTRAST:  OMNIPAQUE IOHEXOL 300 MG/ML  SOLN COMPARISON:  Retroperitoneal ultrasound 11/11/2015 FINDINGS: Lower chest: Dependent atelectasis. Additional bandlike areas of opacity, likely subsegmental atelectasis or scarring. Lung bases otherwise clear. Normal cardiac size. No pericardial effusion. Three-vessel coronary artery calcifications. Extensive calcifications of the aortic leaflets. Calcifications of the thoracic aorta noted as well. Hepatobiliary: No worrisome focal liver abnormality is seen. Normal gallbladder. No visible calcified gallstones. No biliary ductal dilatation. Pancreas: Moderate pancreatic atrophy with extensive pancreatic parenchymal calcifications. No ductal dilatation or acute peripancreatic inflammation. Spleen: Few punctate calcification in the spleen, likely calcified granulomata. No concerning splenic lesions. Splenic size within normal limits. Adrenals/Urinary Tract: Mildly heterogeneous 2.4 cm mass in the right adrenal gland within average Hounsfield units 92. No other concerning adrenal masses. No concerning renal masses. Mildly delayed and striated right nephrogram with geographic regions of hypoattenuation in the kidney which is accentuated on delayed phase imaging. Some mildly asymmetric right perinephric stranding noted as well. Mild bilateral ureterectasis without obstructive urolith. Mild circumferential bladder wall thickening with faint perivesicular haze. Stomach/Bowel: Distal esophagus, stomach and duodenal sweep are unremarkable. No small bowel wall thickening or dilatation. No evidence of obstruction. A normal appendix is visualized. No colonic dilatation or wall thickening. Scattered colonic diverticula without focal inflammation to suggest diverticulitis. Vascular/Lymphatic: Atherosclerotic calcifications within the abdominal aorta and branch vessels. No aneurysm or ectasia. No enlarged abdominopelvic lymph  nodes. Reproductive: Indentation of the bladder base by the heterogeneous, enlarged prostate, could result in outlet obstruction. Heterogeneity appears largely confined to the median lobe which is a more typically benign finding though could correlate with prostate exam and PSA as clinically warranted. Other: No abdominopelvic free fluid or free gas. No bowel containing hernias. Musculoskeletal: Dextrocurvature of the lumbar spine, apex L3. Multilevel degenerative changes are present in the imaged portions of the spine. Additional degenerative changes in the hips and pelvis. Additional degenerative changes in the hands and wrists partially included within the level of imaging. No acute or suspicious osseous abnormalities. IMPRESSION: 1. Mildly  delayed and striated right nephrogram with geographic regions of hypoattenuation in the kidney which is accentuated on delayed phase imaging. Findings are concerning for acute pyelonephritis with mild ureterectasis and indentation of the bladder base by enlarged prostate which could suggest some underlying chronic outlet obstruction as well. 2. Mild heterogeneity of the prostate gland appears largely confined to the median lobe which is a more typically benign finding though could correlate with prostate exam and PSA as clinically warranted. 3. Heterogeneous, intermediate attenuation 2.4 cm right adrenal mass, indeterminate. Consider chemical shift MRI or multiphase adrenal CT for further evaluation. This recommendation follows ACR consensus guidelines: Management of Incidental Adrenal Masses: A White Paper of the ACR Incidental Findings Committee. J Am Coll Radiol 2017;14:1038-1044. 4. Aortic Atherosclerosis (ICD10-I70.0). Electronically Signed   By: Kreg Shropshire M.D.   On: 06/03/2020 16:04   EKG: Independently reviewed. atrial fibrillation, rate controlled. Echocardiogram: Echocardiogram June 2018 EF 50 to 55%  I reviewed all nursing notes, pharmacy notes, vitals,  pertinent old records.  Assessment/Plan 1. Pyelonephritis  Catheter associated UTI POA. Sepsis ruled out for now. Leukocytosis and lactic acidosis with hyponatremia and evidence of pyelonephritis on the CT scan.  But hemodynamically stable. Continue with IV fluids for now. IV cefepime due to recent catheter use. Follow-up on blood cultures and urine cultures.  2.  TIA Presents with left-sided weakness in both upper and lower extremity. CT of the head unremarkable. We will get MRI brain to rule out acute stroke. Failed stroke evaluation. Speech consultation. Patient was on dysphagia diet prior to admission. Aspirin suppository. Pradaxa currently on hold. PT OT consultation. If MRI is positive for acute CVA neurology consultation. We will check echocardiogram as well.  3.  Elevated blood pressure. Permissive hypertension for now.  4.  History of glaucoma with bilateral vision loss. History of giant cell arteritis Continue eyedrops.  5.  Hyponatremia. In the setting of poor p.o. intake. Monitor BMP every 4 hours. Treat with IV fluids. Checks osmolality.  6.  Acute urinary retention. BPH On admission in ER in and out catheterization revealed 1400 urinary retention. CT abdomen positive for BPH. Continue Proscar and Flomax when able to tolerate p.o. medication. We will insert Foley catheter since patient has already failed voiding trial.  7.  Goals of care conversation. Discussed with grandson at bedside. Currently limited code. Goal of care is treat what is treatable. Family would like the patient to be discharged only home. PT OT consultation. If patient fails to progress family willing to consider hospice.  8.  Type 2 diabetes mellitus uncontrolled with hyperglycemia with peripheral vascular disease. Currently every 4 hours sliding scale. Check hemoglobin A1c.  9.  History of hyperlipidemia. Check lipid profile. Lipitor currently on hold for now.  10.  Chronic  A. fib. Currently rate mildly elevated. On Pradaxa at home which is currently on hold until MRI brain is performed.  Nutrition: NPO  DVT Prophylaxis: Therapeutic Anticoagulation with pradaxa  Advance goals of care discussion: Limited code   Consults: none   Family Communication: family was present at bedside, at the time of interview.  Opportunity was given to ask question and all questions were answered satisfactorily.   Author: Lynden Oxford, MD [facility] 06/03/2020 6:47 PM   To reach On-call, see care teams to locate the attending and reach out to them via [email or web address]. If 7PM-7AM, please contact night-coverage If you still have difficulty reaching the attending provider, please page the [facility] (Director on Call) for [facility] on  amion for assistance.  

## 2020-06-03 NOTE — ED Notes (Signed)
Pt taken to ct 

## 2020-06-03 NOTE — ED Notes (Signed)
Pt given additional blanket °

## 2020-06-03 NOTE — Consult Note (Signed)
Pharmacy Antibiotic Note  Codey Burling Montavon is a 86 y.o. male admitted on 06/03/2020 with pyelonephritis.  Pharmacy has been consulted for cefepime dosing. Patient further with AKI which appears to be improving.  Plan: Cefepime 2 g q12h  Continue to monitor renal function and adjust antibiotics as indicated.   Weight: 70.8 kg (156 lb)  Temp (24hrs), Avg:98.4 F (36.9 C), Min:98.4 F (36.9 C), Max:98.4 F (36.9 C)  Recent Labs  Lab 06/03/20 1406 06/03/20 1407  WBC 18.3*  --   CREATININE 1.25*  --   LATICACIDVEN  --  2.1*    Estimated Creatinine Clearance: 40.9 mL/min (A) (by C-G formula based on SCr of 1.25 mg/dL (H)).    No Known Allergies  Antimicrobials this admission: Ceftriaxone 8/30 x 1 Cefepime 8/30 >>   Dose adjustments this admission: n/a  Microbiology results: 8/30 BCx: pending 8/30 UCx: pending   Thank you for allowing pharmacy to be a part of this patient's care.  Tressie Ellis 06/03/2020 6:43 PM

## 2020-06-03 NOTE — ED Notes (Signed)
Brief changed, pt changed into hospital gown.

## 2020-06-03 NOTE — ED Triage Notes (Signed)
Pt was in rehab until Friday.  Has been living with wife and son.  Pt went to pcp today and then was sent to neuro.  Having numbness in left hand and leg.  Has been declining since Friday per grandson.  Pt not oriented at this time. Slumped over in wheelchair.  Blood sugars have been checked at home. Neuro sent over with report sx started today at 1030 am, family report has declined with mobility and mental status since Friday but the slumped over position started today. Was able to walk and can no longer stand. Is on blood thinners.

## 2020-06-03 NOTE — ED Notes (Signed)
Per grandson, Pt has been becoming progressively weak, most notable on the left side side since friday. Pt is AOx4, NAD noted. Pt needs maximum assistance to get into bed. Pt c/o pain in legs with movement. Mucous membranes appear dry.

## 2020-06-03 NOTE — ED Notes (Signed)
Grandson and patient informed of plan of care. Grandson going home- informed of room assignment.

## 2020-06-03 NOTE — ED Notes (Signed)
Unit refused report. Would not take extension for call-back.

## 2020-06-03 NOTE — ED Provider Notes (Signed)
[facility] Emergency Department Provider Note ____________________________________________   First MD Initiated Contact with Patient 06/03/20 1404     (approximate)  I have reviewed the triage vital signs and the nursing notes.  HISTORY  Chief Complaint Neurologic Problem   HPI Chad Bailey is a 86 y.o. malewho presents to the ED for evaluation of functional decline.  Chart review indicates history of A. fib on Pradaxa, HTN, HLD, DM on insulin. Review of PCP note from earlier today indicates patient was discharged from hospital to SNF on 7/30, SNF to home on 8/27, 3 days ago.  Foley removed on 8/26.  Patient reportedly able to walk 50-75 feet with a walker.  Indicates functional decline since patient has been home for the past 3 days.  Patient was referred directly to neurology from PCPs office this morning, where he was noted to be slumped over in the wheelchair and poorly responsive.  He was therefore directed to the ED for evaluation.    Patient presents to ED with his grandson for evaluation of progressively worsening weakness and depressed mental status.  Grandson reports the patient has been staying with him since returning home 3 days ago.  Grandson reports that he was able to transfer with minimal assistance from wheelchair into the car when coming home from the rehab center, taking a couple steps, but has been unable to walk all weekend and "just keeps getting worse."  Grandson reports that patient has had no documented fevers and no falls.  He reports patient has been observed for 24 hours and is cough and there have been no falls.  Medications have been provided as prescribed.  No vomiting, stool changes or signs of distress. Grandson further reports decreased p.o. intake.   Here in the ED, patient reports his bilateral legs hurt, but cannot provide further history. History is limited due to patient's lethargy and altered mental status.  Past  Medical History:  Diagnosis Date  . Blindness   . Dysuria   . Fracture of left ankle   . Giant cell arteritis (HCC)   . gouty arthritis   . History of tobacco abuse   . Hyperlipidemia   . Hypertension   . Loss of vision 01/2013   left eye  . Non-insulin dependent diabetes mellitus   . PAF (paroxysmal atrial fibrillation) (HCC)    a. on pradaxa; b. CHADS2VASc at least 7 (HTN, age x 2, DM, stroke, vascular disease)  . Retinal artery thrombosis, left 2014  . Thyroid nodule 2014  . Type 2 diabetes mellitus [facility](HCC)     Patient Active Problem List   Diagnosis Date Noted  . Goals of care, counseling/discussion   . Palliative care by specialist   . Weakness 04/30/2020  . Acute encephalopathy 04/29/2020  . Hypoglycemia 10/07/2016  . Shakiness 10/07/2016  . Hypotension 10/07/2016  . Leukocytosis 10/07/2016  . Elevated troponin 10/07/2016  . Hypomagnesemia 10/07/2016  . Altered mental status 10/06/2016  . Benign fibroma of prostate 10/11/2015  . Giant cell arteritis with polymyalgia rheumatica (HCC) 08/31/2015  . Absence of bladder continence 08/28/2015  . Intractable headache 08/23/2015  . Absolute anemia 08/12/2015  . B12 deficiency 07/03/2015  . Glaucoma 06/30/2015  . Peripheral nerve disease 06/30/2015  . Nicotine addiction 06/30/2015  . Vision loss, bilateral 12/19/2014  . Inflammation of uveal tract 12/04/2014  . Preoperative cardiovascular examination 06/18/2014  . Bradycardia 06/18/2014  . Orthostasis 03/22/2013  . Peripheral vascular disease in diabetes mellitus (HCC) 03/01/2013  .  CVA (cerebral vascular accident) (HCC) 03/01/2013  . 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 History of tobacco abuse   . gouty arthritis   . Hyperlipidemia 11/04/2010  . HYPERTENSION, BENIGN 11/04/2010  . Atrial fibrillation (HCC) 11/04/2010    Past Surgical History:  Procedure Laterality Date  . EYE SURGERY     right  . LASIK      bilateral eye surgery  . left ankle   1996   s/p surgery with screws    Prior to Admission medications   Medication Sig Start Date End Date Taking? Authorizing Provider  acetaminophen (TYLENOL) 500 MG tablet Take 1,000 mg by mouth every 6 (six) hours as needed for pain.   Yes [provider]  aspirin EC 81 MG tablet Take 81 mg by mouth daily. Swallow whole.   Yes [provider]  atorvastatin (LIPITOR) 10 MG tablet Take 10 mg by mouth daily.  09/30/16  Yes [provider]  Brinzolamide-Brimonidine (SIMBRINZA) 1-0.2 % SUSP Place 1 drop into both eyes 2 (two) times daily.    Yes [provider]  Calcium Carbonate-Vitamin D3 600-400 MG-UNIT TABS Take 1 tablet by mouth in the morning and at bedtime.   Yes [provider]  Cholecalciferol (D 1000) 25 MCG (1000 UT) capsule Take 1,000 Units by mouth daily.   Yes [provider]  clotrimazole (LOTRIMIN) 1 % cream Apply 1 application topically 2 (two) times daily.   Yes [provider]  ferrous sulfate 325 (65 FE) MG tablet Take 1 tablet by mouth daily with breakfast.    Yes [provider]  finasteride (PROSCAR) 5 MG tablet TAKE ONE TABLET BY MOUTH ONCE DAILY Patient taking differently: Take 5 mg by mouth daily.  05/29/15  Yes Sherlene Shams, MD  HUMALOG 100 UNIT/ML injection Inject 14-16 Units into the skin 3 (three) times daily with meals.  08/30/15  Yes [provider]  insulin glargine (LANTUS) 100 UNIT/ML injection Inject 0.1 mLs (10 Units total) into the skin at bedtime. Please taper off insulin Lantus by 2 units if you are tapering off prednisone, following your blood glucose levels,  Thank you Patient taking differently: Inject 18-19 Units into the skin at bedtime.  10/07/16  Yes Katharina Caper, MD  Multiple Vitamin (MULTIVITAMIN) capsule Take 1 capsule by mouth daily.   Yes [provider]  nystatin (NYSTATIN) powder Apply 1 application topically 2 (two)  times daily.   Yes [provider]  PRADAXA 150 MG CAPS capsule TAKE ONE CAPSULE BY MOUTH EVERY 12 HOURS Patient taking differently: Take 150 mg by mouth 2 (two) times daily.  05/28/15  Yes Antonieta Iba, MD  senna (SENOKOT) 8.6 MG tablet Take 2 tablets by mouth daily as needed for constipation.   Yes [provider]  tamsulosin (FLOMAX) 0.4 MG CAPS capsule TAKE ONE CAPSULE BY MOUTH ONCE DAILY Patient taking differently: Take 0.4 mg by mouth daily.  08/21/15  Yes Sherlene Shams, MD  timolol (BETIMOL) 0.5 % ophthalmic solution Place 1 drop into both eyes 2 (two) times daily.    Yes [provider]  Travoprost, BAK Free, (TRAVATAN) 0.004 % SOLN ophthalmic solution Place 1 drop into both eyes every evening. 02/12/20  Yes [provider]  vitamin C (ASCORBIC ACID) 500 MG tablet Take 1 tablet by mouth daily.   Yes [provider]  MICROLET LANCETS MISC USE ONE LANCET TWICE DAILY  Dx code:  E11.9  05/30/15   Sherlene Shams, MD    Allergies Patient has no known allergies.  Family History  Problem Relation Age of Onset  . Heart disease Mother   . Tuberculosis Mother   . Lung cancer Father   . Cancer Maternal Grandmother        bowel  . Kidney disease Neg Hx   . Prostate cancer Neg Hx     Social History Social History   Tobacco Use  . Smoking status: Former Smoker    Packs/day: 6.00    Years: 56.00    Pack years: 336.00    Types: Cigarettes, Pipe    Quit date: 10/05/2002    Years since quitting: 17.6  . Smokeless tobacco: Former Neurosurgeon    Types: Chew  . Tobacco comment: smoked 6 pipes/day;smoked cigarettes 1/2 PPD ,quit chewing tobacco in 2004  Substance Use Topics  . Alcohol use: No  . Drug use: No    Review of Systems  Unable to be reliably obtained due to patient's altered mental status and lethargy.  ____________________________________________   PHYSICAL EXAM:  VITAL SIGNS: Vitals:   06/03/20 1415 06/03/20 1425  BP:  115/89   Pulse:    Resp: 12   Temp:  98.4 F (36.9 C)  SpO2:        Constitutional: Alert and oriented to person only.  Chronically blind.  Follows commands in all 4 extremities.  Eyes: Conjunctivae are normal. Head: Atraumatic. Nose: No congestion/rhinnorhea. Mouth/Throat: Mucous membranes are dry.  Oropharynx non-erythematous. Neck: No stridor. No cervical spine tenderness to palpation. Cardiovascular: Normal rate, regular rhythm. Grossly normal heart sounds.  Good peripheral circulation. Respiratory: Normal respiratory effort.  No retractions. Lungs CTAB. Gastrointestinal: Soft , nondistended. No abdominal bruits. No CVA tenderness. Diffuse lower abdominal tenderness to palpation is present.  No peritoneal features and abdomen is otherwise soft and benign. Musculoskeletal: No lower extremity edema.  No joint effusions. No signs of acute trauma. Patient reports diffuse leg pain when I palpate his lower extremities.  No signs of trauma or acute pathology.  No areas of induration, warmth, erythema or fluctuance.  Bilateral feet are distally neurovascularly intact. Neurologic: Chronic blindness.  Cranial nerves II through XII otherwise intact. 4/5 strength to all 4 extremities without evidence of focal neurologic deficit. Sensation to light touch intact all 4 extremities without evidence of deficit. Skin:  Skin is warm, dry and intact. No rash noted. Psychiatric: Mood and affect are normal. Speech and behavior are normal.  ____________________________________________   LABS (all labs ordered are listed, but only abnormal results are displayed)  Labs Reviewed  CBC WITH DIFFERENTIAL/PLATELET - Abnormal; Notable for the following components:      Result Value   WBC 18.3 (*)    Neutro Abs 14.5 (*)    Monocytes Absolute 2.1 (*)    Abs Immature Granulocytes 0.31 (*)    All other components within normal limits  COMPREHENSIVE METABOLIC PANEL - Abnormal; Notable for the following  components:   Sodium 126 (*)    Chloride 89 (*)    Glucose, Bld 199 (*)    BUN 38 (*)    Creatinine, Ser 1.25 (*)    Albumin 2.8 (*)    AST 58 (*)    GFR calc non Af Amer 51 (*)    GFR calc Af Amer 59 (*)    All other components within normal limits  PROTIME-INR - Abnormal; Notable for the following components:   Prothrombin Time 19.4 (*)  INR 1.7 (*)    All other components within normal limits  APTT - Abnormal; Notable for the following components:   aPTT 59 (*)    All other components within normal limits  LACTIC ACID, PLASMA - Abnormal; Notable for the following components:   Lactic Acid, Venous 2.1 (*)    All other components within normal limits  GLUCOSE, CAPILLARY - Abnormal; Notable for the following components:   Glucose-Capillary 193 (*)    All other components within normal limits  SARS CORONAVIRUS 2 BY RT PCR (HOSPITAL ORDER, PERFORMED IN [facility])  CULTURE, BLOOD (ROUTINE X 2)  CULTURE, BLOOD (ROUTINE X 2)  MAGNESIUM  URINALYSIS, COMPLETE (UACMP) WITH MICROSCOPIC   ____________________________________________  12 Lead EKG  A. fib, rate of 83 bpm, normal axis and intervals.  No evidence of acute ischemia. ____________________________________________  RADIOLOGY  ED MD interpretation: CXR reviewed without evidence of acute cardiopulmonary pathology.  CT head reviewed without evidence of acute intracranial pathology  CT abdomen/pelvis pending at time of this writing.  Official radiology report(s): DG Chest 1 View  Result Date: 06/03/2020 CLINICAL DATA:  Altered mental status and weakness. EXAM: CHEST  1 VIEW COMPARISON:  04/29/2020 and prior radiographs FINDINGS: This is a low volume film with the patient leaning and rotated to the RIGHT. Mild RIGHT basilar atelectasis noted. The cardiomediastinal silhouette is unchanged. No airspace disease, pleural effusion, pneumothorax or acute bony abnormality. IMPRESSION: Mild RIGHT basilar atelectasis.  Electronically Signed   By: Harmon Pier M.D.   On: 06/03/2020 14:23   CT Head Wo Contrast  Result Date: 06/03/2020 CLINICAL DATA:  Left hand and left leg numbness with headache. EXAM: CT HEAD WITHOUT CONTRAST TECHNIQUE: Contiguous axial images were obtained from the base of the skull through the vertex without intravenous contrast. COMPARISON:  April 29, 2020 FINDINGS: Brain: There is mild cerebral atrophy with widening of the extra-axial spaces and ventricular dilatation. There are areas of decreased attenuation within the white matter tracts of the supratentorial brain, consistent with microvascular disease changes. A small area of cortical encephalomalacia, with adjacent chronic white matter low attenuation, is again seen within the right frontal lobe. Vascular: No hyperdense vessel or unexpected calcification. Skull: Normal. Negative for fracture or focal lesion. Sinuses/Orbits: No acute finding. Other: None. IMPRESSION: 1. Generalized cerebral atrophy. 2. Chronic right frontal lobe infarct. 3. No acute intracranial abnormality. Electronically Signed   By: Aram Candela M.D.   On: 06/03/2020 15:10    ____________________________________________   PROCEDURES and INTERVENTIONS  Procedure(s) performed (including Critical Care):  Procedures  Medications  0.9 %  sodium chloride infusion (has no administration in time range)  iohexol (OMNIPAQUE) 300 MG/ML solution 100 mL (100 mLs Intravenous Contrast Given 06/03/20 1533)    ____________________________________________   MDM / ED COURSE  86 year old man presenting from home with 3 days of progressive lethargy and decreased functional status, found to have lactic acidosis and hyponatremia, and requiring medical admission.  Normal vital signs on room air.  Exam with a nonfocal neurologic exam without evidence of acute CVA to call stroke alert.  Most remarkable for diffuse lower abdominal tenderness without peritoneal features.  Patient is in  no acute distress, follows basic commands in all 4 extremities.  He is blind and hard of hearing at baseline.  Blood work demonstrates leukocytosis and lactic acidosis of uncertain etiology.  Mild hyponatremia, treated with normal saline infusion.  Blood cultures were drawn.  Reassuring CT head without evidence of ICH or CVA  in the setting of his anticoagulation.  CXR is clear.  Awaiting urine testing and added on CT abdomen/pelvis to assess for intra-abdominal source of pathology to cause lactic acidosis and metabolic encephalopathy.  Patient will require medical admission, but work-up in the ED is incomplete at this time, and so oncoming physician, Dr. Darnelle Catalan, agrees to follow-up on urine testing and CT abdomen/pelvis to facilitate medical admission.  Clinical Course as of Jun 03 1549  Mon Jun 03, 2020  1452 Went to reassess.  Patient in CT   [DS]  1500 Notified of lactic acidosis of 2.1.  Other blood work reviewed with leukocytosis, CKD at baseline.  Hyponatremia.  Spoke with CT, unfortunately patient has returned to his room.  We will add on a CT abdomen/pelvis   [DS]  1547 Patient signed out to oncoming physician, Dr. Darnelle Catalan   [DS]    Clinical Course User Index [DS] Delton Prairie, MD     ____________________________________________   FINAL CLINICAL IMPRESSION(S) / ED DIAGNOSES  Final diagnoses:  Lethargy  Lactic acidosis  Hyponatremia  Neutrophilia  Metabolic encephalopathy     ED Discharge Orders    None       Jaidynn Balster   Note:  This document was prepared using Dragon voice recognition software and may include unintentional dictation errors.   Delton Prairie, MD 06/03/20 [id number]

## 2020-06-03 NOTE — ED Notes (Signed)
Admitting MD at bedside.

## 2020-06-03 NOTE — ED Provider Notes (Signed)
Patient meets sepsis criteria.  He appears to have pyelonephritis on the CT scan of his abdomen.  Unfortunately attempting to add the ED sepsis orders will require so many adjustments to his medications and fluids it is really not practical at this time since I have 2 other extremely ill patients and working on.  Pretty much everything that the gentleman needs for sepsis is already in place.   Arnaldo Natal, MD 06/03/20 [id number]

## 2020-06-03 NOTE — ED Notes (Signed)
Transportation requested  

## 2020-06-03 NOTE — ED Notes (Addendum)
Pt taken to MRI by transporter at this time. MD notified that pt failed swallow screen. Orders placed for speech evaluation.

## 2020-06-04 ENCOUNTER — Inpatient Hospital Stay (HOSPITAL_COMMUNITY)
Admit: 2020-06-04 | Discharge: 2020-06-04 | Disposition: A | Discharge status: Home / Self Care | Payer: Medicare Other | Attending: Internal Medicine | Admitting: Internal Medicine

## 2020-06-04 DIAGNOSIS — E785 Hyperlipidemia, unspecified: Secondary | ICD-10-CM

## 2020-06-04 DIAGNOSIS — I10 Essential (primary) hypertension: Secondary | ICD-10-CM

## 2020-06-04 DIAGNOSIS — G459 Transient cerebral ischemic attack, unspecified: Secondary | ICD-10-CM

## 2020-06-04 DIAGNOSIS — E1151 Type 2 diabetes mellitus with diabetic peripheral angiopathy without gangrene: Secondary | ICD-10-CM

## 2020-06-04 DIAGNOSIS — H543 Unqualified visual loss, both eyes: Secondary | ICD-10-CM

## 2020-06-04 DIAGNOSIS — Z7189 Other specified counseling: Secondary | ICD-10-CM

## 2020-06-04 LAB — HEMOGLOBIN A1C
Hgb A1c MFr Bld: 7.6 % — ABNORMAL HIGH (ref 4.8–5.6)
Mean Plasma Glucose: 171.42 mg/dL

## 2020-06-04 LAB — BLOOD CULTURE ID PANEL (REFLEXED) - BCID2

## 2020-06-04 LAB — BASIC METABOLIC PANEL
Anion gap: 8 (ref 5–15)
Anion gap: 7 (ref 5–15)
Anion gap: 11 (ref 5–15)
BUN: 32 mg/dL — ABNORMAL HIGH (ref 8–23)
BUN: 33 mg/dL — ABNORMAL HIGH (ref 8–23)
BUN: 32 mg/dL — ABNORMAL HIGH (ref 8–23)
CO2: 26 mmol/L (ref 22–32)
CO2: 25 mmol/L (ref 22–32)
CO2: 24 mmol/L (ref 22–32)
Calcium: 9.2 mg/dL (ref 8.9–10.3)
Calcium: 9.2 mg/dL (ref 8.9–10.3)
Calcium: 9.2 mg/dL (ref 8.9–10.3)
Chloride: 96 mmol/L — ABNORMAL LOW (ref 98–111)
Chloride: 98 mmol/L (ref 98–111)
Chloride: 97 mmol/L — ABNORMAL LOW (ref 98–111)
Creatinine, Ser: 0.86 mg/dL (ref 0.61–1.24)
Creatinine, Ser: 0.83 mg/dL (ref 0.61–1.24)
Creatinine, Ser: 0.82 mg/dL (ref 0.61–1.24)
GFR calc Af Amer: >60 mL/min (ref >60)
GFR calc Af Amer: >60 mL/min (ref >60)
GFR calc Af Amer: >60 mL/min (ref >60)
GFR calc non Af Amer: >60 mL/min (ref >60)
GFR calc non Af Amer: >60 mL/min (ref >60)
GFR calc non Af Amer: >60 mL/min (ref >60)
Glucose, Bld: 228 mg/dL — ABNORMAL HIGH (ref 70–99)
Glucose, Bld: 181 mg/dL — ABNORMAL HIGH (ref 70–99)
Glucose, Bld: 206 mg/dL — ABNORMAL HIGH (ref 70–99)
Potassium: 3.5 mmol/L (ref 3.5–5.1)
Potassium: 3.4 mmol/L — ABNORMAL LOW (ref 3.5–5.1)
Potassium: 3.4 mmol/L — ABNORMAL LOW (ref 3.5–5.1)
Sodium: 130 mmol/L — ABNORMAL LOW (ref 135–145)
Sodium: 130 mmol/L — ABNORMAL LOW (ref 135–145)
Sodium: 132 mmol/L — ABNORMAL LOW (ref 135–145)

## 2020-06-04 LAB — ECHOCARDIOGRAM COMPLETE
AR max vel: 2.36 cm2
AV Area VTI: 1.89 cm2
AV Area mean vel: 2.12 cm2
AV Mean grad: 2 mmHg
AV Peak grad: 2.8 mmHg
Ao pk vel: 0.84 m/s
Area-P 1/2: 3.49 cm2
Single Plane A4C EF: 56.5 %
Weight: 2496 oz

## 2020-06-04 LAB — CBC
HCT: 36.6 % — ABNORMAL LOW (ref 39.0–52.0)
Hemoglobin: 13 g/dL (ref 13.0–17.0)
MCH: 30.2 pg (ref 26.0–34.0)
MCHC: 35.5 g/dL (ref 30.0–36.0)
MCV: 84.9 fL (ref 80.0–100.0)
Platelets: 361 10*3/uL (ref 150–400)
RBC: 4.31 MIL/uL (ref 4.22–5.81)
RDW: 13.4 % (ref 11.5–15.5)
WBC: 19.1 10*3/uL — ABNORMAL HIGH (ref 4.0–10.5)
nRBC: 0 % (ref 0.0–0.2)

## 2020-06-04 LAB — GLUCOSE, CAPILLARY
Glucose-Capillary: 173 mg/dL — ABNORMAL HIGH (ref 70–99)
Glucose-Capillary: 198 mg/dL — ABNORMAL HIGH (ref 70–99)
Glucose-Capillary: 228 mg/dL — ABNORMAL HIGH (ref 70–99)
Glucose-Capillary: 231 mg/dL — ABNORMAL HIGH (ref 70–99)
Glucose-Capillary: 235 mg/dL — ABNORMAL HIGH (ref 70–99)
Glucose-Capillary: 275 mg/dL — ABNORMAL HIGH (ref 70–99)
Glucose-Capillary: 279 mg/dL — ABNORMAL HIGH (ref 70–99)

## 2020-06-04 LAB — LIPID PANEL
Cholesterol: 63 mg/dL (ref 0–200)
HDL: 15 mg/dL — ABNORMAL LOW (ref >40)
LDL Cholesterol: 33 mg/dL (ref 0–99)
Total CHOL/HDL Ratio: 4.2 RATIO
Triglycerides: 75 mg/dL (ref <150)
VLDL: 15 mg/dL (ref 0–40)

## 2020-06-04 LAB — OSMOLALITY: Osmolality: 287 mOsm/kg (ref 275–295)

## 2020-06-04 MED ORDER — SODIUM CHLORIDE 0.9 % IV SOLN: 2.0000 g | INTRAVENOUS | Status: DC

## 2020-06-04 MED ORDER — CHLORHEXIDINE GLUCONATE CLOTH 2 % EX PADS
6.0000 | MEDICATED_PAD | Freq: Every day | CUTANEOUS | Status: DC
  Administered 2020-06-04 – 2020-06-06 (×3): 6 via TOPICAL

## 2020-06-04 MED ORDER — SODIUM CHLORIDE 0.9 % IV SOLN
1.0000 g | INTRAVENOUS | Status: DC
  Filled 2020-06-04: qty 10

## 2020-06-04 MED ORDER — SODIUM CHLORIDE 0.9 % IV SOLN
2.0000 g | Freq: Three times a day (TID) | INTRAVENOUS | Status: DC
  Administered 2020-06-04 – 2020-06-07 (×9): 2 g via INTRAVENOUS
  Filled 2020-06-04 (×12): qty 2

## 2020-06-04 MED ORDER — SODIUM CHLORIDE 0.9 % IV SOLN
2.0000 g | Freq: Three times a day (TID) | INTRAVENOUS | Status: DC
  Administered 2020-06-04: 2 g via INTRAVENOUS
  Filled 2020-06-04 (×4): qty 2

## 2020-06-04 NOTE — Progress Notes (Addendum)
PROGRESS NOTE    Chad Bailey  DXI:[id number] DOB: Apr 25, 1930 DOA: 06/03/2020 PCP: Mickey Farber, MD    Brief Narrative:  Patient admitted to the hospital with a working diagnosis of pyelonephritis, catheter associated tract infection, present on admission.  86 year old male with a past medical history for blindness, giant cell arteritis, gout, dyslipidemia, hypertension, type II diabetes mellitus, paroxysmal atrial fibrillation, history of CVA, abdominal thrombosis and BPH.   Patient was brought to the hospital due to confusion left-sided weakness.  Recently hospitalized for urine tract infection, discharger to SNF on May 03, 2020, and discharged home on May 31, 2020.  Foley catheter was removed 8/26. At home patient had a rapid decline in his overall mental status, poor oral intake and generalized weakness.  He was seen as an outpatient, suspected CVA due to left-sided weakness, he was referred to neurology.  He was planned him to get an MRI of his brain but instead he was sent to the emergency department.  On his initial physical examination blood pressure 143/84, pulse rate 76, respirate 15, oxygen saturation 96%, his lungs are clear to auscultation bilaterally, heart S1-S2 present rhythmic, abdomen soft nontender, no lower extremity edema.  He was neurologically nonfocal. Sodium 126, potassium 3.9, chloride 89, bicarb 25, glucose 199, BUN 38, creatinine 1.25, magnesium 2.0, AST 58, LT 38, white count 18.3, hemoglobin 13.9, hematocrit 40.3, platelets 337.  SARS COVID-19 negative.  Urinalysis specific gravity 1.011, 0-5 red cells, more than 50 white cells.  Head CT with generalized atrophy.  No acute changes.  Chronic right frontal lobe infarct.  CT of the abdomen with findings concerning for acute right pyelonephritis with mild ureterectasis.  2.4 cm right adrenal mass. Chest radiograph with right atelectasis, prominent pulmonary artery on the left.  No infiltrates. EKG 83 bpm, left axis  deviation, normal QRS and QT, atrial fibrillation rhythm, no ST segment or T wave changes, low voltage.    Assessment & Plan:   Principal Problem:   Pyelonephritis Active Problems:   Hyperlipidemia   HYPERTENSION, BENIGN   Peripheral vascular disease in diabetes mellitus (HCC)   CVA (cerebral vascular accident) (HCC)   Vision loss, bilateral   Goals of care, counseling/discussion   1. Right pyelonephritis/ gram negative bacteremia/ urinary retention/ . Patient continue to be very weak and deconditioned, very poor oral intake. His grandson is at the bedside.  Wbc is 19,1, blood culture positive for gram negative rods/ enterobacteria/ Klebsiella.   Will continue antibiotic therapy with IV cefepime, continue IV fluids and follow up on cell count and final cultures.  Follow with bladder scan if retention will place a foley catheter  2. Acute focal neurologic deficit/ Follow up brain MRI with no CVA,. Rules out acute ischemic event TIA or CVA.  Continue neuro checks per unit protocol.  3. Atrial fibrillation. Continue rate control and telemetry monitoring. Will resume pradaxa for now.   4. T2DM with dyslipidemia. Continue glucose cover and monitoring with insulin sliding scale patient with very poor oral intake.  Continue with statin therapy.   5. HTN. Continue blood pressure monitoring.   6. AKI with Hyponatremia. Likely hypovolemic, will continue saline at 75 ml per H. Serum Na up to 132 this am, K at 3,4 and serum bicarbonate at 24. Renal function with serum cr at 0,82.   7. Metabolic encephalopathy. Patient very weak and deconditioned, his is blind. His grandson is at the bedside.  Continue supportive medical care, correction of electrolytes and will consult nutrition for  further recommendations. Add vitamins and thiamine.    Patient continue to be at high risk for worsening pyelonephritis.   Status is: Inpatient  Remains inpatient appropriate because:IV treatments  appropriate due to intensity of illness or inability to take PO   Dispo: The patient is from: Home              Anticipated d/c is to: SNF              Anticipated d/c date is: > 3 days              Patient currently is not medically stable to d/c.   DVT prophylaxis: pradaxa   Code Status:    partial  Family Communication:  I spoke with patient's grandson  at the bedside, we talked in detail about patient's condition, plan of care and prognosis and all questions were addressed.'       Antimicrobials:   Cefepime    Subjective: Patient very deconditioned, all information from nursing and his grandson at the bedside. Patient responds to simple questions, very weak and deconditioned   Objective: Vitals:   06/04/20 0834 06/04/20 1100 06/04/20 1300 06/04/20 1450  BP: (!) 170/83 (!) 142/72 (!) 153/80 (!) 145/69  Pulse: 82 81 75 85  Resp: 18 18 17 18   Temp: 97.7 F (36.5 C) 97.7 F (36.5 C) 98.4 F (36.9 C) 97.9 F (36.6 C)  TempSrc: Oral Oral Oral Oral  SpO2: 98% 96% 97% 96%  Weight:        Intake/Output Summary (Last 24 hours) at 06/04/2020 1500 Last data filed at 06/03/2020 1612 Gross per 24 hour  Intake 100 ml  Output 1200 ml  Net -1100 ml   Filed Weights   06/03/20 1400  Weight: 70.8 kg    Examination:   General:deconditioned  Neurology: patient opens eyes to voice and light touch, only responds to simple questions.  E ENT:  Trace pallor, no icterus, oral mucosa dry Cardiovascular: No JVD. S1-S2 present, rhythmic, no gallops, rubs, or murmurs. No lower extremity edema. Pulmonary: positive breath sounds bilaterally, poor inspiratory effort Gastrointestinal. Abdomen soft and non tender Skin. No rashes Musculoskeletal: no joint deformities     Data Reviewed: I have personally reviewed following labs and imaging studies  CBC: Recent Labs  Lab 06/03/20 1406 06/04/20 0140  WBC 18.3* 19.1*  NEUTROABS 14.5*  --   HGB 13.9 13.0  HCT 40.3 36.6*  MCV 87.8  84.9  PLT 337 361   Basic Metabolic Panel: Recent Labs  Lab 06/03/20 1406 06/03/20 2042 06/04/20 0140 06/04/20 0627 06/04/20 0947  NA 126* 127* 130* 130* 132*  K 3.9 3.7 3.5 3.4* 3.4*  CL 89* 90* 96* 98 97*  CO2 25 23 26 25 24   GLUCOSE 199* 223* 228* 181* 206*  BUN 38* 35* 32* 33* 32*  CREATININE 1.25* 1.11 0.86 0.83 0.82  CALCIUM 9.5 9.7 9.2 9.2 9.2  MG 2.0  --   --   --   --    GFR: Estimated Creatinine Clearance: 62.4 mL/min (by C-G formula based on SCr of 0.82 mg/dL). Liver Function Tests: Recent Labs  Lab 06/03/20 1406  AST 58*  ALT 38  ALKPHOS 97  BILITOT 1.2  PROT 6.6  ALBUMIN 2.8*   No results for input(s): LIPASE, AMYLASE in the last 168 hours. No results for input(s): AMMONIA in the last 168 hours. Coagulation Profile: Recent Labs  Lab 06/03/20 1406  INR 1.7*   Cardiac Enzymes: No results  for input(s): CKTOTAL, CKMB, CKMBINDEX, TROPONINI in the last 168 hours. BNP (last 3 results) No results for input(s): PROBNP in the last 8760 hours. HbA1C: Recent Labs    06/04/20 0140  HGBA1C 7.6*   CBG: Recent Labs  Lab 06/03/20 2034 06/03/20 2353 06/04/20 0404 06/04/20 0752 06/04/20 1159  GLUCAP 219* 228* 198* 173* 235*   Lipid Profile: Recent Labs    06/04/20 0140  CHOL 63  HDL 15*  LDLCALC 33  TRIG 75  CHOLHDL 4.2   Thyroid Function Tests: No results for input(s): TSH, T4TOTAL, FREET4, T3FREE, THYROIDAB in the last 72 hours. Anemia Panel: No results for input(s): VITAMINB12, FOLATE, FERRITIN, TIBC, IRON, RETICCTPCT in the last 72 hours.    Radiology Studies: I have reviewed all of the imaging during this hospital visit personally     Scheduled Meds: .  stroke: mapping our early stages of recovery book   Does not apply Once  . aspirin  300 mg Rectal Daily   Or  . aspirin EC  325 mg Oral Daily  . atorvastatin  10 mg Oral Daily  . brimonidine  1 drop Both Eyes TID  . brinzolamide  1 drop Both Eyes TID  . clotrimazole  1  application Topical BID  . dabigatran  150 mg Oral BID  . finasteride  5 mg Oral Daily  . insulin aspart  0-9 Units Subcutaneous Q4H  . latanoprost  1 drop Both Eyes QHS  . nystatin  1 application Topical BID  . tamsulosin  0.4 mg Oral Daily  . timolol  1 drop Both Eyes BID   Continuous Infusions: . sodium chloride 100 mL/hr at 06/04/20 0836  . ceFEPime (MAXIPIME) IV 2 g (06/04/20 1059)     LOS: 1 day        Jewel Venditto Annett Gula, MD

## 2020-06-04 NOTE — Plan of Care (Signed)

## 2020-06-04 NOTE — Evaluation (Signed)
Occupational Therapy Evaluation Patient Details Name: Chad Bailey MRN: [id number] DOB: [birth year] Today's Date: 06/04/2020    History of Present Illness Chad Bailey is an 86 y/o male who had chief complaints of L sided wekaness and confusion and was admitted for pyelonephritis. 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   Clinical Impression   Chad Bailey was seen for OT/PT co-evaluation this date. Prior to hospital admission, pt was recently d/c from SNF where pt was using RW for t/fs and requiring assist for ADLs. Pt lives c spouse in home c ramped entrance, unable to obtain further home setup from pt this date. Pt is oriented to self only, states year as 2001, does not answer further orientation questions. Pt presents to acute OT demonstrating impaired ADL performance and functional mobility 2/2 functional strength/ROM/balance deficits, decreased safety awareness, and decreased LB access. Pt currently requires MAX A don B socks at bed level. SETUP + VCs oral care semi-reclined in bed. MAX A x2 + RW for ADL t/f. MAX A toileting at bed level. Pt would benefit from skilled OT to address noted impairments and functional limitations (see below for any additional details) in order to maximize safety and independence while minimizing falls risk and caregiver burden. Upon hospital discharge, recommend STR to maximize pt safety and return to PLOF.     Follow Up Recommendations  SNF    Equipment Recommendations  Other (comment) (TBD at next venue of care)    Recommendations for Other Services       Precautions / Restrictions Precautions Precautions: Fall Restrictions Weight Bearing Restrictions: No      Mobility Bed Mobility Overal bed mobility: Needs Assistance Bed Mobility: Supine to Sit;Sit to Supine;Rolling Rolling: Mod assist;+2 for physical  assistance   Supine to sit: +2 for physical assistance;Mod assist;HOB elevated Sit to supine: Max assist;+2 for physical assistance   General bed mobility comments: MOD A x2 sup>sit assist for sequencing, initiating BLE, and trunk control. MAX A x2 sit>sup  Transfers Overall transfer level: Needs assistance Equipment used: Rolling walker (2 wheeled) Transfers: Sit to/from Stand Sit to Stand: Mod assist;+2 physical assistance;From elevated surface     General transfer comment: Heavy mod-max+2 A for sit <> stand transfer for boost into standing, balance to remain upright, and eccentric control on descent. Verbal cues for sequencing of activities and for placement of hands/feet.    Balance Overall balance assessment: Needs assistance Sitting-balance support: Feet supported;Single extremity supported Sitting balance-Chad Bailey Scale: Fair Sitting balance - Comments: pt initailly with R posterolateral lean requiring mod A for balance then able to transition to close SBA for safety.   Standing balance support: Bilateral upper extremity supported Standing balance-Chad Bailey Scale: Poor Standing balance comment: required mod-max+2 A to remain upright        ADL either performed or assessed with clinical judgement   ADL Overall ADL's : Needs assistance/impaired    General ADL Comments: MAX A don B socks at bed level. SETUP + VCs oral care semi-reclined in bed. MAX A x2 + RW for ADL t/f. MAX A toileting at bed level      Vision Baseline Vision/History: Glaucoma Patient Visual Report: No change from baseline Additional Comments: States he can see outlines at baseline - recognizes "a woman" when asked if he can see person to his Left       Pertinent Vitals/Pain Pain Assessment: Faces Faces  Pain Scale: Hurts little more Pain Location: overall body stiffness Pain Descriptors / Indicators: Aching;Discomfort;Grimacing Pain Intervention(s): Repositioned     Hand Dominance Right    Extremity/Trunk Assessment Upper Extremity Assessment Upper Extremity Assessment: Generalized weakness;RUE deficits/detail;LUE deficits/detail RUE Deficits / Details: Grip 3+/5. AROM Shoulder flexion ~80*, elbow flexion WFL LUE Deficits / Details: Grip 3+/5. AROM Shoulder flexion ~70*, elbow flexion WFL. Increased tone in shoulder   Lower Extremity Assessment Lower Extremity Assessment: Generalized weakness       Communication Communication Communication: HOH   Cognition Arousal/Alertness: Awake/alert Behavior During Therapy: WFL for tasks assessed/performed;Flat affect Overall Cognitive Status: No family/caregiver present to determine baseline cognitive functioning      General Comments: Pt kept his eyes closed for most of session and required increased time to answer questions giving answers of questionable accuracy. Pt alert to name and DOB and reported that it is 1999 and unable to state the president.   General Comments  noted bruising and skin tears along anterior lower legs    Exercises Exercises: Other exercises Other Exercises Other Exercises: Pt educated re: OT role, DME recs, d/c recs, falls prevention, ECS Other Exercises: LBD, toileting, sup<>sit, sti<>stand, sitting/stnading balance/tolerance   Shoulder Instructions      Home Living Family/patient expects to be discharged to:: Private residence Living Arrangements: Spouse/significant other Available Help at Discharge: Family;Available PRN/intermittently Type of Home: House Home Access: Ramped entrance    Additional Comments: No family present to get detailed information about home set up.      Prior Functioning/Environment Level of Independence: Needs assistance  Gait / Transfers Assistance Needed: Per grandson via phone, pt was ambulating 50-75 feet "with RW assist" at skilled nursing facility and was able to perform sit <> stand transfers with guarding via gaitbelt but required little to no physical  assistance. ADL's / Homemaking Assistance Needed: Per chart reveiw, pt needs assist with most ADLs.   Comments: History limited due to limitations of patient responses and verbalizations.        OT Problem List: Decreased strength;Decreased range of motion;Decreased activity tolerance;Impaired balance (sitting and/or standing)      OT Treatment/Interventions: Self-care/ADL training;Therapeutic exercise;Energy conservation;DME and/or AE instruction;Therapeutic activities;Patient/family education;Balance training    OT Goals(Current goals can be found in the care plan section) Acute Rehab OT Goals Patient Stated Goal: none stated OT Goal Formulation: With patient Time For Goal Achievement: 06/18/20 Potential to Achieve Goals: Good ADL Goals Pt Will Perform Grooming: with set-up;sitting;with supervision Pt Will Perform Upper Body Dressing: with min assist;sitting Pt Will Transfer to Toilet: with max assist;squat pivot transfer;bedside commode (c LRAD PRN)  OT Frequency: Min 1X/week   Barriers to D/C: Inaccessible home environment;Decreased caregiver support          Co-evaluation PT/OT/SLP Co-Evaluation/Treatment: Yes Reason for Co-Treatment: For patient/therapist safety;To address functional/ADL transfers PT goals addressed during session: Mobility/safety with mobility;Proper use of DME OT goals addressed during session: ADL's and self-care;Proper use of Adaptive equipment and DME      AM-PAC OT "6 Clicks" Daily Activity     Outcome Measure Help from another person eating meals?: A Little Help from another person taking care of personal grooming?: A Little Help from another person toileting, which includes using toliet, bedpan, or urinal?: A Lot Help from another person bathing (including washing, rinsing, drying)?: A Lot Help from another person to put on and taking off regular upper body clothing?: A Little Help from another person to put on and taking off regular lower  body clothing?: A Lot 6 Click Score: 15   End of Session Equipment Utilized During Treatment: Rolling walker;Gait belt  Activity Tolerance: Patient tolerated treatment well;Patient limited by fatigue Patient left: in bed;with call bell/phone within reach;with bed alarm set  OT Visit Diagnosis: Other abnormalities of gait and mobility (R26.89);Muscle weakness (generalized) (M62.81)                Time: 6004-5997 OT Time Calculation (min): 30 min Charges:  OT General Charges $OT Visit: 1 Visit OT Evaluation $OT Eval Moderate Complexity: 1 Mod OT Treatments $Self Care/Home Management : 8-22 mins  Kathie Dike, M.S. OTR/L  06/04/20, 12:14 PM  ascom [phone]

## 2020-06-04 NOTE — Consult Note (Addendum)
Pharmacy Antibiotic Note  Chad Bailey is a 86 y.o. male admitted on 06/03/2020 with pyelonephritis.  Pharmacy has been consulted for cefepime dosing. Patient further with AKI which appears to be improving.  Today, 06/04/2020  Day 1 antibiotics  Blood culture with GNR - BCID E aerogenes  SCr improving  CT possibly pyelonephritis - suspect from urinary retention after foley removed 8/26.  + BPH - on flomax and proscar prior to admission   Plan:  Increase Cefepime to 2 g q8h based on improved renal function.  Continue Cefepime pending susceptibilities as this is organism commonly produces AMP-C  Continue to monitor renal function and adjust antibiotics as indicated.   Weight: 70.8 kg (156 lb)  Temp (24hrs), Avg:98.4 F (36.9 C), Min:97.7 F (36.5 C), Max:99 F (37.2 C)  Recent Labs  Lab 06/03/20 1406 06/03/20 1407 06/03/20 2042 06/04/20 0140 06/04/20 0627  WBC 18.3*  --   --  19.1*  --   CREATININE 1.25*  --  1.11 0.86 0.83  LATICACIDVEN  --  2.1*  --   --   --     Estimated Creatinine Clearance: 61.6 mL/min (by C-G formula based on SCr of 0.83 mg/dL).    No Known Allergies  Antimicrobials this admission: Ceftriaxone 8/30 x 1 Cefepime 8/30 >>   Dose adjustments this admission: n/a  Microbiology results: 8/30 BCx: GNR, BCID K/ aerogenes 8/30 UCx: pending   Thank you for allowing pharmacy to be a part of this patient's care.  Juliette Alcide, PharmD, BCPS.   Work Cell: [phone] 06/04/2020 8:53 AM

## 2020-06-04 NOTE — Progress Notes (Signed)
PHARMACY - PHYSICIAN COMMUNICATION CRITICAL VALUE ALERT - BLOOD CULTURE IDENTIFICATION (BCID)  Chad Bailey is an 86 y.o. male who presented to [facility] on 06/03/2020 with a chief complaint of fever  Assessment:  Lab reports 4 of 4 bottles w/ GNR, enterobacterales, Kleb aerogenes  Name of physician (or Provider) ContactedCliffton Asters NP  Current antibiotics: Cefepime  Changes to prescribed antibiotics recommended:  Patient is on recommended antibiotics - No changes needed  Results for orders placed or performed during the hospital encounter of 06/03/20  Blood Culture ID Panel (Reflexed) (Collected: 06/03/2020  3:52 PM)  Result Value Ref Range   Enterococcus faecalis NOT DETECTED NOT DETECTED   Enterococcus Faecium NOT DETECTED NOT DETECTED   Listeria monocytogenes NOT DETECTED NOT DETECTED   Staphylococcus species NOT DETECTED NOT DETECTED   Staphylococcus aureus (BCID) NOT DETECTED NOT DETECTED   Staphylococcus epidermidis NOT DETECTED NOT DETECTED   Staphylococcus lugdunensis NOT DETECTED NOT DETECTED   Streptococcus species NOT DETECTED NOT DETECTED   Streptococcus agalactiae NOT DETECTED NOT DETECTED   Streptococcus pneumoniae NOT DETECTED NOT DETECTED   Streptococcus pyogenes NOT DETECTED NOT DETECTED   A.calcoaceticus-baumannii NOT DETECTED NOT DETECTED   Bacteroides fragilis NOT DETECTED NOT DETECTED   Enterobacterales DETECTED (A) NOT DETECTED   Enterobacter cloacae complex NOT DETECTED NOT DETECTED   Escherichia coli NOT DETECTED NOT DETECTED   Klebsiella aerogenes DETECTED (A) NOT DETECTED   Klebsiella oxytoca NOT DETECTED NOT DETECTED   Klebsiella pneumoniae NOT DETECTED NOT DETECTED   Proteus species NOT DETECTED NOT DETECTED   Salmonella species NOT DETECTED NOT DETECTED   Serratia marcescens NOT DETECTED NOT DETECTED   Haemophilus influenzae NOT DETECTED NOT DETECTED   Neisseria meningitidis NOT DETECTED NOT DETECTED   Pseudomonas aeruginosa NOT DETECTED  NOT DETECTED   Stenotrophomonas maltophilia NOT DETECTED NOT DETECTED   Candida albicans NOT DETECTED NOT DETECTED   Candida auris NOT DETECTED NOT DETECTED   Candida glabrata NOT DETECTED NOT DETECTED   Candida krusei NOT DETECTED NOT DETECTED   Candida parapsilosis NOT DETECTED NOT DETECTED   Candida tropicalis NOT DETECTED NOT DETECTED   Cryptococcus neoformans/gattii NOT DETECTED NOT DETECTED   CTX-M ESBL NOT DETECTED NOT DETECTED   Carbapenem resistance IMP NOT DETECTED NOT DETECTED   Carbapenem resistance KPC NOT DETECTED NOT DETECTED   Carbapenem resistance NDM NOT DETECTED NOT DETECTED   Carbapenem resist OXA 48 LIKE NOT DETECTED NOT DETECTED   Carbapenem resistance VIM NOT DETECTED NOT DETECTED    Chad Bailey A 06/04/2020  7:18 AM

## 2020-06-04 NOTE — Evaluation (Addendum)
The information in this patient note, response to treatment, and overall treatment plan developed has been reviewed and agreed upon by this clinician.  Jerilynn Som, MS, CCC-SLP [phone] Rehab Services   06/05/20,10:40 AM Clinical/Bedside Swallow Evaluation Patient Details  Name: Chad Bailey MRN: [id number] Date of Birth: 06/07/1930  Today's Date: 06/04/2020 Time: SLP Start Time (ACUTE ONLY): 0845 SLP Stop Time (ACUTE ONLY): 0930 SLP Time Calculation (min) (ACUTE ONLY): 45 min  Past Medical History:  Past Medical History:  Diagnosis Date  . Blindness   . Dysuria   . Fracture of left ankle   . Giant cell arteritis (HCC)   . gouty arthritis   . History of tobacco abuse   . Hyperlipidemia   . Hypertension   . Loss of vision 01/2013   left eye  . Non-insulin dependent diabetes mellitus   . PAF (paroxysmal atrial fibrillation) (HCC)    a. on pradaxa; b. CHADS2VASc at least 7 (HTN, age x 2, DM, stroke, vascular disease)  . Retinal artery thrombosis, left 2014  . Thyroid nodule 2014  . Type 2 diabetes mellitus (HCC)    Past Surgical History:  Past Surgical History:  Procedure Laterality Date  . EYE SURGERY     right  . LASIK     bilateral eye surgery  . left ankle   1996   s/p surgery with screws   HPI:  Pt is a 86 y.o. 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 fib on Pradaxa, history of CVA, abdominal thrombosis, urinary retention, BPH. Pt was admitted secondary to confusion and left-sided weakness. Prior to arrival pt was admitted to [facility] 05/03/2020 for a UTI and remained there until 8/27 when he returned home. At home he displayed poor P.O. intake, fatigued, and declining mental status. MRI (7/27): age-related atrophy, mod chronic vessel ischemic disease, superimposed small remore R frontal lobe infarct. MRI/CT (06/03/20): Unremarkable-- compared to 04/30/20.     Assessment / Plan / Recommendation Clinical Impression  Pt appears to present w/ moderate oropharyngeal phase dysphagia w/ increased risk for aspiration of PO's. CXR on 06/03/2020 demonstrated mild R basilar atelectasis. Neuromuscular weakness of lingual protrusion evidenced by weak lingual protrusion during OME; lingual ROM WFL. Weak volitional cough (2/5) but adequate vocal cord contact during phonation. Laryngeal elevation min reduced on dry swallow and PO trial of all consistencies-- ice chips, nectar & puree. Overall oral motor/sensory function mild-moderately impaired d/t: reduced lingual strength for protrusion & posterior movement (2/5) and significantly poor dentition including missing teeth. Overall min increased time for A-P propulsion of the bolus w/ puree trials and WFL for liquids. Oral residue adequately cleared w/ all consistencies during PO trials given mod verbal/tactile cues to dry swallow. During pharyngeal phase of swallowing pt exhibited s/s of dysphagia demonstrated by decreased laryngeal excursion, multiple swallows, and audible swallows. Pharyngeal phase s/s noted may indicate potential concern for pharyngeal residue. Given time for multiple swallows, pt appeared to adequately manage all trial consistencies given. Consumed all consistencies w/ no overt coughing, decline in vocal quality, or change in respiratory presentation during/post trials. Pt required full positioning upright for oral intake; education on need to sit upright when eating/drinking given to pt. Pt required total assist for feeding; was able to raise cup to mouth for nectar thick consistency w/ clinician assist d/t bilateral blindness. Pt appears habituated to drinking w/ a straw which facilitated increased intake of nectar thick liquids. Pt  demonstrated impacted oral-prep stage/bolus acceptance d/t bilateral blindness when trials were presented via spoon by clinician. Overall, oral-prep stage adequate evidenced by  appropriate tongue propulsion and awareness of bolus when given mod tacticle/verbal cues. Speech clear. Observed an overall reduced interest in eating, possibly d/t general feeling of malaise and fatigue. Of note, pt was only given trials of nectar & puree due to concerns of risk for aspiration.  Recommend Dysphagia 1 diet (puree consistency) w/ nectar thick liquids. Strict aspiration precautions; pills crushed in puree; 100% supervision & feeding support w/ all oral intake. Recommend consults with dietician and palliative care for GOC. St services to f/u w/ toleration of diet and further trials to upgrade the diet if appropriate.      SLP Visit Diagnosis: Dysphagia, oropharyngeal phase (R13.12)    Aspiration Risk  Mild aspiration risk;Moderate aspiration risk;Risk for inadequate nutrition/hydration    Diet Recommendation   Dysphagia 1 diet (puree consistency) w/ Nectar thick liquids; aspiration precautions and feeding Supervision w/ all oral intake  Medication Administration: Crushed with puree (for safer swallowing)    Other  Recommendations Recommended Consults:  (dietician, palliative care for GOC ) Oral Care Recommendations: Oral care BID;Oral care before and after PO;Staff/trained caregiver to provide oral care Other Recommendations: Order thickener from pharmacy;Prohibited food (jello, ice cream, thin soups);Remove water pitcher;Have oral suction available   Follow up Recommendations Skilled Nursing facility      Frequency and Duration min 3x week  2 weeks       Prognosis Prognosis for Safe Diet Advancement: Fair Barriers to Reach Goals: Time post onset;Severity of deficits      Swallow Study   General Date of Onset: 06/03/20 HPI: Pt is a 86 y.o. 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 fib on Pradaxa, history of CVA, abdominal thrombosis, urinary retention, BPH. Pt was  admitted secondary to confusion and left-sided weakness. Prior to arrival pt was admitted to [facility] 05/03/2020 for a UTI and remained there until 8/27 when he returned home. At home he displayed poor P.O. intake, fatigued, and declining mental status. MRI (7/27): age-related atrophy, mod chronic vessel ischemic disease, superimposed small remore R frontal lobe infarct. MRI/CT (06/03/20): Unremarkable-- compared to 04/30/20.  Type of Study: Bedside Swallow Evaluation Previous Swallow Assessment: 04/30/2020: grossly adequate pharyngeal phase swallow; no neuromuscular deficits; oral phase deficits noted including prolonged mastication, decreased awareness for bolus/bite size, control of bolus propulsion for A-P transfer for swallowing. Recommend mechanical soft diet w/ well-cut meats, moistened foods, thin liquids, pills whole in puree & supervision during meals.  Diet Prior to this Study: Dysphagia 2 (chopped);Thin liquids (pills whole in puree: unsure of diet at SNF) Temperature Spikes Noted: No (WBC 19.1) Respiratory Status: Room air History of Recent Intubation: No Behavior/Cognition: Confused;Requires cueing;Cooperative;Alert (Awake, drowsy ) Oral Cavity Assessment: Dry Oral Care Completed by SLP: Yes Oral Cavity - Dentition: Poor condition;Missing dentition Vision: Impaired for self-feeding (bilateral blindness) Self-Feeding Abilities: Needs assist;Needs set up;Total assist Patient Positioning: Upright in bed (complete positioning) Baseline Vocal Quality: Normal;Low vocal intensity Volitional Cough: Weak (2/5 strength ) Volitional Swallow: Able to elicit    Oral/Motor/Sensory Function Overall Oral Motor/Sensory Function: Mild impairment (-moderate) Facial ROM: Within Functional Limits Facial Symmetry: Within Functional Limits Facial Strength: Within Functional Limits Facial Sensation: Within Functional Limits Lingual ROM: Within Functional Limits (fair) Lingual Symmetry: Within Functional  Limits Lingual Strength: Reduced (2/5 lingual strength for protrusion/posterior) Lingual Sensation: Within Functional  Limits (Adequate reflexive medial elevation of tongue during OME) Velum: Within Functional Limits Mandible: Within Functional Limits   Ice Chips Ice chips: Within functional limits Presentation: Spoon (x2) Other Comments: Required verbal & tactile cueing (vision deficits & generalized fatigue/weakness)    Thin Liquid Thin Liquid: Not tested    Nectar Thick Nectar Thick Liquid: Impaired Presentation: Cup;Straw;Spoon (majority via straw; 3oz total) Oral Phase Impairments: Reduced labial seal;Poor awareness of bolus (w/ spoon (visual impairment) ) Oral phase functional implications:  (adequate) Pharyngeal Phase Impairments: Multiple swallows;Suspected delayed Swallow (audible swallow) Other Comments: Pt habituated to drink using straw, was able to self-feed with assist using straw w/ greater ease than by cup   Honey Thick     Puree Puree: Impaired Presentation: Spoon (x4) Oral Phase Impairments:  (Adequate) Oral Phase Functional Implications: Prolonged oral transit (min) Pharyngeal Phase Impairments: Multiple swallows   Solid     Solid: Not tested      Oliver Pila  Graduate Clinician 06/04/2020,12:30 PM

## 2020-06-04 NOTE — Evaluation (Signed)
Physical Therapy Evaluation Patient Details Name: Chad Bailey MRN: [id number] DOB: 1930/01/02 Today's Date: 06/04/2020   History of Present Illness  Chad Bailey is an 86 y/o male who had chief complaints of L sided wekaness and confusion and was admitted for pyelonephritis. 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  Clinical Impression  Pt received lying in bed upon arrival to room. Co-evaluation performed with OT for patient safety and functional mobility assessment. Pt able to move BUE against gravity with slight assistance and clinician initiation of movements. Pt noted to move BLE in bed slightly and required heavy tactile and verbal cues to slide them to the side of the bed prior to performing supine to sit. Max+2 A for supine to sit for trunk management into sitting, pivot hips, and BLE control off of bed. Once in sitting, pt initially required mod A and was able to progress to close stand-by assist for safety. Pt able to perform sit <> stand with mod-max+2 A for boosting hips, balance while taking several small steps to R, and eccentric control on descent. Pt requested to return to bed and deferred sitting up in chair. Max+2 A for trunk control on descent and for BLE management back onto bed. Pt soiled his brief and required max A for rolling side to side for pericare and soiled brief removal. Pt currently presents with deficits in cognition, strength, functional activity tolerance, balance, and functional mobility. According to grandson per phone conversation, pt was ambulatory at facility from which he was recently discharged home, using RW with little to no external physical assistance and transferring with minimal assist with gait belt. Recommend skilled PT to address aforementioned deficits and STR at discharge to optimize return to PLOF and maximize  functional mobility and decreased caregiver burden.     Follow Up Recommendations SNF;Supervision for mobility/OOB    Equipment Recommendations  None recommended by PT    Recommendations for Other Services       Precautions / Restrictions Precautions Precautions: Fall Restrictions Weight Bearing Restrictions: No      Mobility  Bed Mobility Overal bed mobility: Needs Assistance Bed Mobility: Supine to Sit;Sit to Supine;Rolling     Supine to sit: Max assist;+2 for physical assistance Sit to supine: Max assist;+2 for physical assistance   General bed mobility comments: Max+2 for supine <> sit for trunk and BLE management. Pt not noted to initiate movement. Rolling side to side with max A for coming into sidelying position.  Transfers Overall transfer level: Needs assistance Equipment used: Rolling walker (2 wheeled) Transfers: Sit to/from Stand Sit to Stand: Mod assist;Max assist;+2 physical assistance         General transfer comment: Heavy mod-max+2 A for sit <> stand transfer for boost into standing, balance to remain upright, and eccentric control on descent. Verbal cues for sequencing of activities and for placement of hands/feet.  Ambulation/Gait Ambulation/Gait assistance: Max assist;+2 physical assistance Gait Distance (Feet): 1 Feet Assistive device: Rolling walker (2 wheeled) Gait Pattern/deviations: Decreased step length - right;Decreased step length - left;Trunk flexed Gait velocity: decreased   General Gait Details: Pt sidestepped 1 foot with max+2 A for balance. Verbal cues for sequencing of tasks and max A for walker management.  Stairs            Wheelchair Mobility    Modified Rankin (Stroke Patients Only)  Balance Overall balance assessment: Needs assistance Sitting-balance support: Feet supported;Single extremity supported Sitting balance-Leahy Scale: Fair Sitting balance - Comments: pt initailly with R posterolateral lean  requiring mod A for balance then able to transition to close SBA for safety.   Standing balance support: Bilateral upper extremity supported Standing balance-Leahy Scale: Poor Standing balance comment: required mod-max+2 A to remain upright                             Pertinent Vitals/Pain Pain Assessment: Faces Faces Pain Scale: Hurts even more Pain Location: overall body stiffness Pain Descriptors / Indicators: Discomfort    Home Living Family/patient expects to be discharged to:: Private residence Living Arrangements: Spouse/significant other Available Help at Discharge: Family;Available PRN/intermittently Type of Home: House Home Access: Ramped entrance         Additional Comments: No family present to get detailed information about home set up.    Prior Function Level of Independence: Needs assistance   Gait / Transfers Assistance Needed: Per grandson via phone, pt was ambulating 50-75 feet "with RW assist" at skilled nursing facility and was able to perform sit <> stand transfers with guarding via gaitbelt but required little to no physical assistance.  ADL's / Homemaking Assistance Needed: Per chart reveiw, pt needs assist with most ADLs.  Comments: History limited due to limitations of patient responses and verbalizations.     Hand Dominance        Extremity/Trunk Assessment   Upper Extremity Assessment Upper Extremity Assessment: Generalized weakness;Defer to OT evaluation    Lower Extremity Assessment Lower Extremity Assessment: Generalized weakness;Difficult to assess due to impaired cognition (grossly 2+ to 3+ bilaterally)       Communication   Communication: HOH  Cognition Arousal/Alertness: Awake/alert Behavior During Therapy: Flat affect;WFL for tasks assessed/performed Overall Cognitive Status: No family/caregiver present to determine baseline cognitive functioning                                 General Comments: Pt  kept his eyes closed for most of session and required increased time to answer questions giving answers of questionable accuracy. Pt alert to name and DOB and reported that it is 1999 and unable to state the president.      General Comments General comments (skin integrity, edema, etc.): noted bruising and skin tears along anterior lower legs    Exercises     Assessment/Plan    PT Assessment Patient needs continued PT services  PT Problem List Decreased strength;Decreased range of motion;Decreased activity tolerance;Decreased balance;Decreased mobility;Decreased cognition;Decreased knowledge of use of DME;Decreased safety awareness;Decreased skin integrity;Pain       PT Treatment Interventions DME instruction;Gait training;Functional mobility training;Therapeutic activities;Therapeutic exercise;Balance training;Cognitive remediation;Patient/family education    PT Goals (Current goals can be found in the Care Plan section)  Acute Rehab PT Goals Patient Stated Goal: none stated PT Goal Formulation: Patient unable to participate in goal setting Time For Goal Achievement: 06/18/20 Potential to Achieve Goals: Fair    Frequency Min 2X/week   Barriers to discharge        Co-evaluation PT/OT/SLP Co-Evaluation/Treatment: Yes Reason for Co-Treatment: To address functional/ADL transfers;For patient/therapist safety PT goals addressed during session: Mobility/safety with mobility;Proper use of DME;Strengthening/ROM;Balance         AM-PAC PT "6 Clicks" Mobility  Outcome Measure Help needed turning from your back to your side while in a  flat bed without using bedrails?: A Lot Help needed moving from lying on your back to sitting on the side of a flat bed without using bedrails?: A Lot Help needed moving to and from a bed to a chair (including a wheelchair)?: A Lot Help needed standing up from a chair using your arms (e.g., wheelchair or bedside chair)?: A Lot Help needed to walk in  hospital room?: A Lot Help needed climbing 3-5 steps with a railing? : Total 6 Click Score: 11    End of Session Equipment Utilized During Treatment: Gait belt Activity Tolerance: Patient limited by fatigue Patient left: in bed;with call bell/phone within reach;with bed alarm set Nurse Communication: Mobility status PT Visit Diagnosis: Unsteadiness on feet (R26.81);Other abnormalities of gait and mobility (R26.89);Muscle weakness (generalized) (M62.81);Pain Pain - part of body:  (global)    Time: 0017-4944 PT Time Calculation (min) (ACUTE ONLY): 30 min   Charges:              Frederich Chick, SPT  Frederich Chick 06/04/2020, 11:26 AM

## 2020-06-04 NOTE — Progress Notes (Signed)
*  PRELIMINARY RESULTS* Echocardiogram 2D Echocardiogram has been performed.  Joanette Gula Jannice Beitzel 06/04/2020, 10:22 AM

## 2020-06-05 LAB — URINE CULTURE: Culture: >=100000 — AB

## 2020-06-05 LAB — BASIC METABOLIC PANEL
Anion gap: 9 (ref 5–15)
BUN: 26 mg/dL — ABNORMAL HIGH (ref 8–23)
CO2: 23 mmol/L (ref 22–32)
Calcium: 8.5 mg/dL — ABNORMAL LOW (ref 8.9–10.3)
Chloride: 104 mmol/L (ref 98–111)
Creatinine, Ser: 0.76 mg/dL (ref 0.61–1.24)
GFR calc Af Amer: >60 mL/min (ref >60)
GFR calc non Af Amer: >60 mL/min (ref >60)
Glucose, Bld: 150 mg/dL — ABNORMAL HIGH (ref 70–99)
Potassium: 3.2 mmol/L — ABNORMAL LOW (ref 3.5–5.1)
Sodium: 136 mmol/L (ref 135–145)

## 2020-06-05 LAB — GLUCOSE, CAPILLARY
Glucose-Capillary: 156 mg/dL — ABNORMAL HIGH (ref 70–99)
Glucose-Capillary: 108 mg/dL — ABNORMAL HIGH (ref 70–99)
Glucose-Capillary: 146 mg/dL — ABNORMAL HIGH (ref 70–99)
Glucose-Capillary: 175 mg/dL — ABNORMAL HIGH (ref 70–99)
Glucose-Capillary: 186 mg/dL — ABNORMAL HIGH (ref 70–99)

## 2020-06-05 LAB — CBC
HCT: 34 % — ABNORMAL LOW (ref 39.0–52.0)
Hemoglobin: 12 g/dL — ABNORMAL LOW (ref 13.0–17.0)
MCH: 30.4 pg (ref 26.0–34.0)
MCHC: 35.3 g/dL (ref 30.0–36.0)
MCV: 86.1 fL (ref 80.0–100.0)
Platelets: 339 10*3/uL (ref 150–400)
RBC: 3.95 MIL/uL — ABNORMAL LOW (ref 4.22–5.81)
RDW: 13.7 % (ref 11.5–15.5)
WBC: 15.3 10*3/uL — ABNORMAL HIGH (ref 4.0–10.5)
nRBC: 0 % (ref 0.0–0.2)

## 2020-06-05 LAB — LACTIC ACID, PLASMA
Lactic Acid, Venous: 0.9 mmol/L (ref 0.5–1.9)
Lactic Acid, Venous: 1.2 mmol/L (ref 0.5–1.9)

## 2020-06-05 MED ORDER — POTASSIUM CHLORIDE CRYS ER 20 MEQ PO TBCR
40.0000 meq | EXTENDED_RELEASE_TABLET | Freq: Once | ORAL | Status: AC
  Administered 2020-06-05: 40 meq via ORAL
  Filled 2020-06-05: qty 2

## 2020-06-05 MED ORDER — RISAQUAD PO CAPS
2.0000 | ORAL_CAPSULE | Freq: Three times a day (TID) | ORAL | Status: DC
  Administered 2020-06-05 – 2020-06-06 (×4): 2 via ORAL
  Filled 2020-06-05 (×6): qty 2

## 2020-06-05 NOTE — Plan of Care (Signed)
°  Problem: Education: Goal: Knowledge of General Education information will improve Description: Including pain rating scale, medication(s)/side effects and non-pharmacologic comfort measures Outcome: Progressing   Problem: Health Behavior/Discharge Planning: Goal: Ability to manage health-related needs will improve Outcome: Progressing   Problem: Clinical Measurements: Goal: Ability to maintain clinical measurements within normal limits will improve Outcome: Progressing Goal: Will remain free from infection Outcome: Progressing Goal: Diagnostic test results will improve Outcome: Progressing Goal: Respiratory complications will improve Outcome: Progressing Goal: Cardiovascular complication will be avoided Outcome: Progressing   Problem: Activity: Goal: Risk for activity intolerance will decrease Outcome: Progressing   Problem: Nutrition: Goal: Adequate nutrition will be maintained Outcome: Progressing   Problem: Coping: Goal: Level of anxiety will decrease Outcome: Progressing   Problem: Elimination: Goal: Will not experience complications related to bowel motility Outcome: Progressing Goal: Will not experience complications related to urinary retention Outcome: Progressing   Problem: Pain Managment: Goal: General experience of comfort will improve Outcome: Progressing   Problem: Skin Integrity: Goal: Risk for impaired skin integrity will decrease Outcome: Progressing  Grandson at bedside - assists pt with moving and turning as needed - denies concerns at the moment and does state that pt is improved form yesterday

## 2020-06-05 NOTE — TOC Progression Note (Signed)
Transition of Care [facility]) - Progression Note    Patient Details  Name: Chad Bailey MRN: [id number] Date of Birth: 09/21/1930  [facility]) CM/SW Contact  Barrie Dunker, RN Phone Number: 06/05/2020, 2:17 PM  Clinical Narrative:   Spoke with the patient's grandson Cletis Athens in the room, I asked him if they would consider a referral for Hospice to follow the patient at home,I gave him a list of hospice agencies in the county, he is going to speak to his grand mother about this and get back to me, He is very familiar with hospice as he went thru this with his other grandfather recently.  I am awaiting a clal from him to go forward with the hospice referral. I provided my contact inforamtion    Expected Discharge Plan: Home w Home Health Services Barriers to Discharge: Continued Medical Work up  Expected Discharge Plan and Services Expected Discharge Plan: Home w Home Health Services   Discharge Planning Services: CM Consult   Living arrangements for the past 2 months: Assisted Living Facility                 DME Arranged: 3-N-1, Hospital bed, Suction (air mattress) DME Agency: [employer] Date DME Agency Contacted: 06/05/20 Time DME Agency Contacted: 1053 Representative spoke with at DME Agency: Elease Hashimoto HH Arranged: PT, RN, OT, Nurse's Aide HH Agency: [facility] Date [facility] Agency Contacted: 06/05/20 Time HH Agency Contacted: 1056 Representative spoke with at [facility] Agency: [place]   Social Determinants of Health (SDOH) Interventions    Readmission Risk Interventions No flowsheet data found.

## 2020-06-05 NOTE — TOC Progression Note (Signed)
Transition of Care [facility]) - Progression Note    Patient Details  Name: Chad Bailey MRN: [id number] Date of Birth: 04/30/1930  [facility]) CM/SW Contact  Barrie Dunker, RN Phone Number: 06/05/2020, 1:46 PM  Clinical Narrative:   I reached out to do an outpatient Palliative referral and the patient is already followed and they will continue to follow outpatient   Expected Discharge Plan: Home w Home Health Services Barriers to Discharge: Continued Medical Work up  Expected Discharge Plan and Services Expected Discharge Plan: Home w Home Health Services   Discharge Planning Services: CM Consult   Living arrangements for the past 2 months: Assisted Living Facility                 DME Arranged: 3-N-1, Hospital bed, Suction (air mattress) DME Agency: [employer] Date DME Agency Contacted: 06/05/20 Time DME Agency Contacted: 1053 Representative spoke with at DME Agency: Elease Hashimoto HH Arranged: PT, RN, OT, Nurse's Aide HH Agency: [facility] Date [facility] Agency Contacted: 06/05/20 Time HH Agency Contacted: 1056 Representative spoke with at [facility] Agency: [place]   Social Determinants of Health (SDOH) Interventions    Readmission Risk Interventions No flowsheet data found.

## 2020-06-05 NOTE — Progress Notes (Signed)
PROGRESS NOTE    Chad Bailey  TKP:[id number] DOB: May 12, 1930 DOA: 06/03/2020 PCP: Mickey Farber, MD    Brief Narrative:  Patient admitted to the hospital with a working diagnosis of pyelonephritis, catheter associated tract infection, present on admission.  Chad Bailey is a 86 year old male with PMH/o  blindness, giant cell arteritis, gout, dyslipidemia, hypertension, type II diabetes mellitus, paroxysmal atrial fibrillation, history of CVA, abdominal thrombosis and BPH.   Patient was brought to the hospital due to confusion left-sided weakness.  Recently hospitalized for urine tract infection, discharger to SNF on May 03, 2020, and discharged home on May 31, 2020.   Foley catheter was removed 8/26.  At home patient had a rapid decline in his overall mental status, poor oral intake and generalized weakness.  He was seen as an outpatient, suspected CVA due to left-sided weakness, he was referred to neurology.  He was planned him to get an MRI of his brain but instead he was sent to the emergency department.    ED course of stay/work-up On arrival hemodynamically stable.  He was neurologically nonfocal. Sodium 126, potassium 3.9, chloride 89, bicarb 25, glucose 199, BUN 38, creatinine 1.25, magnesium 2.0, AST 58, LT 38, white count 18.3, hemoglobin 13.9, hematocrit 40.3, platelets 337.   SARS COVID-19 negative.  Urinalysis specific gravity 1.011, 0-5 red cells, more than 50 white cells.   Head CT with generalized atrophy.  No acute changes.  Chronic right frontal lobe infarct.   CT of the abdomen with findings concerning for acute right pyelonephritis with mild ureterectasis.  2.4 cm right adrenal mass.  Chest radiograph with right atelectasis, prominent pulmonary artery on the left.  No infiltrates. EKG 83 bpm, left axis deviation, normal QRS and QT, atrial fibrillation rhythm, no ST segment or T wave changes, low voltage.    Assessment & Plan:   Principal Problem:    Pyelonephritis Active Problems:   Hyperlipidemia   HYPERTENSION, BENIGN   Peripheral vascular disease in diabetes mellitus (HCC)   CVA (cerebral vascular accident) (HCC)   Vision loss, bilateral   Goals of care, counseling/discussion    Right pyelonephritis/ Bacteremia/ urinary retention -Continues to reveal severe debility due to weakness, deconditioning poor p.o. intake -Needing assist with all ADLs -Improving WBCs, hemodynamically stable.  Wbc is 19,1, Blood culture positive for gram negative rods/ Enterobacteria/ Klebsiella.  -We will follow with a final culture sensitivity  -We will continue IV cefepime,  -We will continue IV fluid resuscitation    Follow with bladder scan if retention will place a foley catheter   Acute focal neurologic deficit/  -Follow MRI revealed no acute CVA, Follow up brain MRI with no CVA,. Rules out acute ischemic event TIA or CVA.  Continue neuro checks per unit protocol.   Atrial fibrillation. Continue rate control and telemetry monitoring.  Continue Pradaxa  T2DM with dyslipidemia.  -Stable checking CBG before every meal at bedtime, SSI coverage -Poor p.o. intake, abstaining from long-acting insulin  Hyperlipidemia -Stable, -Currently on statins, the risk may outweigh the benefit we may DC at discharge   HTN. -Stable, continue blood pressure monitoring.   AKI with Hyponatremia.  -Likely exacerbated by hypovolemia, dehydration, continue gentle IV fluid hydration normal saline -Improving we will continue to follow closely  Metabolic encephalopathy  -likely metabolic due to dehydration, bacteremia, UTI -Remain deconditioned, chronic blindness -We will monitor mental status closely, seem to be at baseline now, following commands  Malnutrition with protein deficiency, poor p.o. intake -Nutrition consulted  Ethics -patient remains partial code, palliative care will be consulted Prognosis remains grim due to progressive decline  mentally and physically -along with comorbidities, poor p.o. intake malnutrition, muscle wasting, deconditioning   Patient continue to be at high risk for worsening pyelonephritis.   Status is: Inpatient  Remains inpatient appropriate because:IV treatments appropriate due to intensity of illness or inability to take PO   Dispo: The patient is from: Home              Anticipated d/c is to: SNF              Anticipated d/c date is: > 3 days              Patient currently is not medically stable to d/c.   DVT prophylaxis: pradaxa   Code Status:    partial  Family Communication:  Grandson - detail about patient's condition, plan of care and prognosis and all questions were addressed.'       Antimicrobials:   Cefepime    Subjective: Patient very deconditioned, all information from nursing and his grandson at the bedside. Patient responds to simple questions, very weak and deconditioned   Objective: Vitals:   06/05/20 0001 06/05/20 0423 06/05/20 0754 06/05/20 1127  BP: 125/67 (!) 149/71 (!) 166/90 (!) 148/74  Pulse: 71 81 79 79  Resp: 20 18 18 16   Temp: 97.8 F (36.6 C) 98 F (36.7 C) 98 F (36.7 C) 98.3 F (36.8 C)  TempSrc: Oral  Oral Oral  SpO2: 97% 97% 98% 99%  Weight:        Intake/Output Summary (Last 24 hours) at 06/05/2020 1531 Last data filed at 06/05/2020 0954 Gross per 24 hour  Intake 3509.52 ml  Output 1725 ml  Net 1784.52 ml   Filed Weights   06/03/20 1400  Weight: 70.8 kg      Physical Exam:   General:  Alert, oriented, cooperative, no distress;   HEENT:  Normocephalic, PERRL, otherwise with in Normal limits   Neuro:  CNII-XII intact. , normal motor and sensation, reflexes intact   Lungs:   Clear to auscultation BL, Respirations unlabored, no wheezes / crackles  Cardio:    S1/S2, RRR, No murmure, No Rubs or Gallops   Abdomen:   Soft, non-tender, bowel sounds active all four quadrants,  no guarding or peritoneal signs.  Muscular skeletal:   Limited exam - in bed, able to move all 4 extremities, severe generalized weakness noted--needs two-person assist with ambulation, ADLs 2+ pulses,  symmetric, No pitting edema  Skin:  Dry, warm to touch, negative for any Rashes, No open wounds  Wounds: Please see nursing documentation           Data Reviewed: I have personally reviewed following labs and imaging studies  CBC: Recent Labs  Lab 06/03/20 1406 06/04/20 0140 06/05/20 0423  WBC 18.3* 19.1* 15.3*  NEUTROABS 14.5*  --   --   HGB 13.9 13.0 12.0*  HCT 40.3 36.6* 34.0*  MCV 87.8 84.9 86.1  PLT 337 361 339   Basic Metabolic Panel: Recent Labs  Lab 06/03/20 1406 06/03/20 1406 06/03/20 2042 06/04/20 0140 06/04/20 0627 06/04/20 0947 06/05/20 0423  NA 126*   < > 127* 130* 130* 132* 136  K 3.9   < > 3.7 3.5 3.4* 3.4* 3.2*  CL 89*   < > 90* 96* 98 97* 104  CO2 25   < > 23 26 25 24 23   GLUCOSE 199*   < >  223* 228* 181* 206* 150*  BUN 38*   < > 35* 32* 33* 32* 26*  CREATININE 1.25*   < > 1.11 0.86 0.83 0.82 0.76  CALCIUM 9.5   < > 9.7 9.2 9.2 9.2 8.5*  MG 2.0  --   --   --   --   --   --    < > = values in this interval not displayed.   GFR: Estimated Creatinine Clearance: 63.9 mL/min (by C-G formula based on SCr of 0.76 mg/dL). Liver Function Tests: Recent Labs  Lab 06/03/20 1406  AST 58*  ALT 38  ALKPHOS 97  BILITOT 1.2  PROT 6.6  ALBUMIN 2.8*   No results for input(s): LIPASE, AMYLASE in the last 168 hours. No results for input(s): AMMONIA in the last 168 hours. Coagulation Profile: Recent Labs  Lab 06/03/20 1406  INR 1.7*   Cardiac Enzymes: No results for input(s): CKTOTAL, CKMB, CKMBINDEX, TROPONINI in the last 168 hours. BNP (last 3 results) No results for input(s): PROBNP in the last 8760 hours. HbA1C: Recent Labs    06/04/20 0140  HGBA1C 7.6*   CBG: Recent Labs  Lab 06/04/20 2037 06/04/20 2329 06/05/20 0426 06/05/20 0752 06/05/20 1136  GLUCAP 279* 231* 156* 108* 146*   Lipid  Profile: Recent Labs    06/04/20 0140  CHOL 63  HDL 15*  LDLCALC 33  TRIG 75  CHOLHDL 4.2   Thyroid Function Tests: No results for input(s): TSH, T4TOTAL, FREET4, T3FREE, THYROIDAB in the last 72 hours. Anemia Panel: No results for input(s): VITAMINB12, FOLATE, FERRITIN, TIBC, IRON, RETICCTPCT in the last 72 hours.    Radiology Studies: I have reviewed all of the imaging during this hospital visit personally     Scheduled Meds: .  stroke: mapping our early stages of recovery book   Does not apply Once  . acidophilus  2 capsule Oral TID  . aspirin  300 mg Rectal Daily  . atorvastatin  10 mg Oral Daily  . brimonidine  1 drop Both Eyes TID  . brinzolamide  1 drop Both Eyes TID  . Chlorhexidine Gluconate Cloth  6 each Topical Daily  . clotrimazole  1 application Topical BID  . dabigatran  150 mg Oral BID  . finasteride  5 mg Oral Daily  . insulin aspart  0-9 Units Subcutaneous Q4H  . latanoprost  1 drop Both Eyes QHS  . nystatin  1 application Topical BID  . tamsulosin  0.4 mg Oral Daily  . timolol  1 drop Both Eyes BID   Continuous Infusions: . sodium chloride 75 mL/hr at 06/05/20 0600  . ceFEPime (MAXIPIME) IV 2 g (06/05/20 1250)     LOS: 2 days        Kendell Bane, MD

## 2020-06-05 NOTE — TOC Progression Note (Signed)
Transition of Care [facility]) - Progression Note    Patient Details  Name: Chad Bailey MRN: [id number] Date of Birth: 10-08-1929  Transition of Care [facility]) CM/SW Contact  Barrie Dunker, RN Phone Number: 06/05/2020, 10:37 AM  Clinical Narrative: had a lengthy conversation with Cletis Athens the patient's grandson, the patient lives at home with the wife They prefer him to go home and not to SNF, he has been to [facility] recently and only was home a few days    Spoke with Elease Hashimoto with [employer] and requested a Hospital Bed, with an air mattress, a 3 in 1 and a suction machine, she is going to check on the air mattress and let me know if they can get it, the patient will need          Expected Discharge Plan and Services                                                 Social Determinants of Health (SDOH) Interventions    Readmission Risk Interventions No flowsheet data found.

## 2020-06-05 NOTE — Progress Notes (Signed)
  Speech Language Pathology Treatment: Dysphagia  Patient Details Name: Chad Bailey MRN: [id number] DOB: 01-01-1930 Today's Date: 06/05/2020 Time: 9163-8466 SLP Time Calculation (min) (ACUTE ONLY): 45 min  Assessment / Plan / Recommendation Clinical Impression  Pt seen today for ongoing toleration of dysphagia diet; education w/ Son on pt's dysphagia and risk for aspiration currently. Pt appears to be tolerating current diet of purees and Nectar consistency liquids w/ both Son and NSG w/ no reported overt s/s of aspiration. No decline in pt's respiratory presentation today - pt remains on RA, afebrile.  Met w/ Son during this session for education on dysphagia diet consistency recommended: Puree w/ thickened liquids - Nectar consistency; also ordering information and samples given, handouts on information and aspiration precautions given to Son. ST services will be available for any further needs while admitted; NSG to reconsult ST services if new needs arise. Son and NSG agreed.     HPI HPI: Pt is a 86 y.o. 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 fib on Pradaxa, history of CVA, abdominal thrombosis, urinary retention, BPH. Pt was admitted secondary to confusion and left-sided weakness. Prior to arrival pt was admitted to [facility] 05/03/2020 for a UTI and remained there until 8/27 when he returned home. At home he displayed poor P.O. intake, fatigued, and declining mental status. MRI (7/27): age-related atrophy, mod chronic vessel ischemic disease, superimposed small remore R frontal lobe infarct. MRI/CT (06/03/20): Unremarkable-- compared to 04/30/20.       SLP Plan  All goals met (pt is rec'd to remain on dysphagia diet currently)       Recommendations  Diet recommendations: Dysphagia 1 (puree);Nectar-thick liquid Liquids provided via: Cup;Straw;Teaspoon Medication Administration: Crushed  with puree (for safer swallowing overall) Supervision: Staff to assist with self feeding;Full supervision/cueing for compensatory strategies Compensations: Minimize environmental distractions;Slow rate;Small sips/bites;Lingual sweep for clearance of pocketing;Multiple dry swallows after each bite/sip;Follow solids with liquid Postural Changes and/or Swallow Maneuvers: Seated upright 90 degrees;Upright 30-60 min after meal                General recommendations:  (Palliative Care consult for [place]; dietician f/u to support) Oral Care Recommendations: Oral care BID;Oral care before and after PO;Staff/trained caregiver to provide oral care Follow up Recommendations:  (Home per Son) SLP Visit Diagnosis: Dysphagia, oropharyngeal phase (R13.12) Plan: All goals met (pt is rec'd to remain on dysphagia diet currently)       GO                 Orinda Kenner, MS, CCC-SLP Speech Language Pathologist Rehab Services [phone] [facility] 06/05/2020, 4:20 PM

## 2020-06-05 NOTE — Progress Notes (Cosign Needed)
Patient spends 95 % of the time in bed. Aspiration causes frequent                                                                gagging and choking.. Torso required to be elevated at least 30-40 degrees to prevent gagging and choking                                                                                                                            aspiration. Bed wedges do not provide adequate elevation to resolve issues with aspiration.gagging and choking       Requires frequent changes in body position which cannot be achieved with a normal bed.

## 2020-06-05 NOTE — Progress Notes (Addendum)
[facility] hospital Liaison note:  Patient is currently followed by [facility] community Palliative program. Chad Bailey is aware. Will continue to follow through discharge.  UPDATE: Patient was discharged from the [facility] community Palliative program on 8/30. TOC Deliliah Earl Bailey made aware.  Dayna Barker BSN, RN, [facility] [employer] [phone]

## 2020-06-06 DIAGNOSIS — R627 Adult failure to thrive: Secondary | ICD-10-CM

## 2020-06-06 DIAGNOSIS — Z66 Do not resuscitate: Secondary | ICD-10-CM

## 2020-06-06 DIAGNOSIS — Z515 Encounter for palliative care: Secondary | ICD-10-CM

## 2020-06-06 LAB — BASIC METABOLIC PANEL
Anion gap: 7 (ref 5–15)
BUN: 18 mg/dL (ref 8–23)
CO2: 26 mmol/L (ref 22–32)
Calcium: 8.2 mg/dL — ABNORMAL LOW (ref 8.9–10.3)
Chloride: 106 mmol/L (ref 98–111)
Creatinine, Ser: 0.58 mg/dL — ABNORMAL LOW (ref 0.61–1.24)
GFR calc Af Amer: >60 mL/min (ref >60)
GFR calc non Af Amer: >60 mL/min (ref >60)
Glucose, Bld: 122 mg/dL — ABNORMAL HIGH (ref 70–99)
Potassium: 3.4 mmol/L — ABNORMAL LOW (ref 3.5–5.1)
Sodium: 139 mmol/L (ref 135–145)

## 2020-06-06 LAB — CBC
HCT: 35.2 % — ABNORMAL LOW (ref 39.0–52.0)
Hemoglobin: 12.2 g/dL — ABNORMAL LOW (ref 13.0–17.0)
MCH: 30.3 pg (ref 26.0–34.0)
MCHC: 34.7 g/dL (ref 30.0–36.0)
MCV: 87.3 fL (ref 80.0–100.0)
Platelets: 337 10*3/uL (ref 150–400)
RBC: 4.03 MIL/uL — ABNORMAL LOW (ref 4.22–5.81)
RDW: 13.9 % (ref 11.5–15.5)
WBC: 15 10*3/uL — ABNORMAL HIGH (ref 4.0–10.5)
nRBC: 0 % (ref 0.0–0.2)

## 2020-06-06 LAB — GLUCOSE, CAPILLARY
Glucose-Capillary: 114 mg/dL — ABNORMAL HIGH (ref 70–99)
Glucose-Capillary: 136 mg/dL — ABNORMAL HIGH (ref 70–99)
Glucose-Capillary: 144 mg/dL — ABNORMAL HIGH (ref 70–99)
Glucose-Capillary: 160 mg/dL — ABNORMAL HIGH (ref 70–99)
Glucose-Capillary: 185 mg/dL — ABNORMAL HIGH (ref 70–99)
Glucose-Capillary: 205 mg/dL — ABNORMAL HIGH (ref 70–99)

## 2020-06-06 LAB — CULTURE, BLOOD (ROUTINE X 2)

## 2020-06-06 LAB — LACTIC ACID, PLASMA
Lactic Acid, Venous: 1.5 mmol/L (ref 0.5–1.9)
Lactic Acid, Venous: 1.3 mmol/L (ref 0.5–1.9)

## 2020-06-06 MED ORDER — RISAQUAD PO CAPS
2.0000 | ORAL_CAPSULE | Freq: Three times a day (TID) | ORAL | Status: DC
  Administered 2020-06-06 – 2020-06-07 (×4): 2 via ORAL
  Filled 2020-06-06 (×6): qty 2

## 2020-06-06 MED ORDER — HYDRALAZINE HCL 20 MG/ML IJ SOLN: 10.0000 mg | INTRAMUSCULAR | Status: DC | PRN

## 2020-06-06 MED ORDER — AMLODIPINE BESYLATE 5 MG PO TABS
5.0000 mg | ORAL_TABLET | Freq: Every day | ORAL | Status: DC
  Administered 2020-06-06 – 2020-06-07 (×2): 5 mg via ORAL
  Filled 2020-06-06 (×2): qty 1

## 2020-06-06 MED ORDER — POTASSIUM CHLORIDE CRYS ER 20 MEQ PO TBCR
40.0000 meq | EXTENDED_RELEASE_TABLET | Freq: Once | ORAL | Status: AC
  Administered 2020-06-06: 40 meq via ORAL
  Filled 2020-06-06: qty 2

## 2020-06-06 NOTE — Care Management Important Message (Signed)
Important Message  Patient Details  Name: Chad Bailey MRN: [id number] Date of Birth: 01-Mar-1930   Medicare Important Message Given:  Yes     Johnell Comings 06/06/2020, 1:49 PM

## 2020-06-06 NOTE — Plan of Care (Signed)

## 2020-06-06 NOTE — Progress Notes (Signed)
[facility] Room 158 [facility] Hospital Liaison RN note:  Received request from Dr. Flossie Dibble and Marice Potter, [facility] for hospice services at home after discharge. Chart and patient information under review by [facility] physician. Hospice eligibility is approved .  Spoke with grandson, Cletis Athens to initiate education related to hospice philosophy, services and to answer any questions. He verbalized understanding of information given and had no questions.  DME needs discussed and family requests the following equipment: Hospital bed, 3 in 1, suction set up which was previously arranged through [employer]. Address has been verified and is correct in the chart. Contact is the son, [name] [phone] to arrange time of equipment delivery.  Please send signed and completed DNR home with patient/family. Please provide prescriptions at discharge as needed to ensure ongoing symptom management.  [facility] information and contact numbers given to grandson Cletis Athens. Above information shared with hospital care team.  Please call with any hospice related questions or concerns.  Cyndra Numbers, RN [facility] Liaison [phone]

## 2020-06-06 NOTE — Progress Notes (Signed)
Physical Therapy Treatment Patient Details Name: Chad Bailey MRN: [id number] DOB: [birth year] Today's Date: 06/06/2020    History of Present Illness Chad Bailey is an 86 y/o male who had chief complaints of L sided wekaness and confusion and was admitted for pyelonephritis. 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    PT Comments    Pt lying in bed with eyes closed with grandson present at bedside. Pt responding to clinician and reports that he is tired, wants to get some rest, and does not want to do anything today. Pt reporting heel pain and agreeable to be repositioned for increased comfort and protective positioning. Pt performed active SLR, with noted extensor lag bilaterally, to allow positioning of multiple pillows under LE tp float heels. Pt performed several times and followed commands to perform several hip/knee flexion movements. Grandson present reported to let the patient rest and thanked clinician for their time. Pt left with all needs met. If family refusing for pt to go to STR, recommend HHPT to address current functional limitations.   Follow Up Recommendations  SNF;Supervision for mobility/OOB     Equipment Recommendations  None recommended by PT    Recommendations for Other Services       Precautions / Restrictions Precautions Precautions: Fall Restrictions Weight Bearing Restrictions: No    Mobility  Bed Mobility               General bed mobility comments: pt repositioned for comfort to include floating bilateral heels and increased pillow placement; pt actively moving BLE against gravity to assist in placement  Transfers                 General transfer comment: not performed  Ambulation/Gait             General Gait Details: not performed   Stairs             Wheelchair Mobility     Modified Rankin (Stroke Patients Only)       Balance       Sitting balance - Comments: not performed       Standing balance comment: not performed                            Cognition Arousal/Alertness: Lethargic Behavior During Therapy: WFL for tasks assessed/performed Overall Cognitive Status: Within Functional Limits for tasks assessed                                 General Comments: Pt sleepy and just wanting to sleep. Grandson agreeable to let patient get some rest.      Exercises Other Exercises Other Exercises: pt performed BLE SLR and hip/knee flexion bilaterally for inconsistent reps    General Comments General comments (skin integrity, edema, etc.): Pt appears frail looking with sunken periorbital areas; pt keeps eyes closed throughout session however answers questions and follows directions appropriately      Pertinent Vitals/Pain Pain Assessment: Faces Faces Pain Scale: Hurts a little bit Pain Location: bilateral heels Pain Descriptors / Indicators: Sore;Discomfort Pain Intervention(s): Repositioned;Monitored during session    Home Living                      Prior Function  PT Goals (current goals can now be found in the care plan section) Acute Rehab PT Goals Patient Stated Goal: none stated PT Goal Formulation: Patient unable to participate in goal setting Time For Goal Achievement: 06/18/20 Potential to Achieve Goals: Fair Progress towards PT goals: Progressing toward goals    Frequency    Min 2X/week      PT Plan Current plan remains appropriate    Co-evaluation              AM-PAC PT "6 Clicks" Mobility   Outcome Measure  Help needed turning from your back to your side while in a flat bed without using bedrails?: A Lot Help needed moving from lying on your back to sitting on the side of a flat bed without using bedrails?: A Lot Help needed moving to and from a bed to a chair  (including a wheelchair)?: A Lot Help needed standing up from a chair using your arms (e.g., wheelchair or bedside chair)?: A Lot Help needed to walk in hospital room?: A Lot Help needed climbing 3-5 steps with a railing? : Total 6 Click Score: 11    End of Session   Activity Tolerance: Patient limited by fatigue;Other (comment) (pt tired and deferred further activity) Patient left: in bed;with call bell/phone within reach;with bed alarm set;with family/visitor present (grandson Wille Glaser present) Nurse Communication: Mobility status PT Visit Diagnosis: Unsteadiness on feet (R26.81);Other abnormalities of gait and mobility (R26.89);Muscle weakness (generalized) (M62.81);Pain Pain - Right/Left:  (bilateral) Pain - part of body:  (heels)     Time: 1610-9604 PT Time Calculation (min) (ACUTE ONLY): 8 min  Charges:                        Vale Haven, SPT   Vale Haven 06/06/2020, 1:06 PM

## 2020-06-06 NOTE — Progress Notes (Signed)
PROGRESS NOTE    Chad Bailey  JEH:[id number] DOB: 08/12/1930 DOA: 06/03/2020 PCP: Mickey Farber, MD    Brief Narrative:  Patient admitted to the hospital with a working diagnosis of pyelonephritis, catheter associated tract infection, present on admission.  Mr. Chad Bailey is a 86 year old male with PMH/o  blindness, giant cell arteritis, gout, dyslipidemia, hypertension, type II diabetes mellitus, paroxysmal atrial fibrillation, history of CVA, abdominal thrombosis and BPH.   Patient was brought to the hospital due to confusion left-sided weakness.  Recently hospitalized for urine tract infection, discharger to SNF on May 03, 2020, and discharged home on May 31, 2020.   Foley catheter was removed 8/26.  At home patient had a rapid decline in his overall mental status, poor oral intake and generalized weakness.  He was seen as an outpatient, suspected CVA due to left-sided weakness, he was referred to neurology.  He was planned him to get an MRI of his brain but instead he was sent to the emergency department.    ED course of stay/work-up On arrival hemodynamically stable.  He was neurologically nonfocal. Sodium 126, potassium 3.9, chloride 89, bicarb 25, glucose 199, BUN 38, creatinine 1.25, magnesium 2.0, AST 58, LT 38, white count 18.3, hemoglobin 13.9, hematocrit 40.3, platelets 337.   SARS COVID-19 negative.  Urinalysis specific gravity 1.011, 0-5 red cells, more than 50 white cells.   Head CT with generalized atrophy.  No acute changes.  Chronic right frontal lobe infarct.   CT of the abdomen with findings concerning for acute right pyelonephritis with mild ureterectasis.  2.4 cm right adrenal mass.  Chest radiograph with right atelectasis, prominent pulmonary artery on the left.  No infiltrates. EKG 83 bpm, left axis deviation, normal QRS and QT, atrial fibrillation rhythm, no ST segment or T wave changes, low voltage.    Subjective: The patient was seen and examined  this morning, found lethargic, but arousable. Per nursing staff continue to have poor p.o. intake No issues overnight  Grandson present at bedside.  Updated in detail. Palliative following... Family is agreed to proceed with hospice.   Discussed yesterday extensively with patient's son POA over the phone regarding patient's poor prognosis due to comorbidities and current active medical problems.       Assessment & Plan:   Principal Problem:   Pyelonephritis Active Problems:   Hyperlipidemia   HYPERTENSION, BENIGN   Peripheral vascular disease in diabetes mellitus (HCC)   CVA (cerebral vascular accident) (HCC)   Vision loss, bilateral   Goals of care, counseling/discussion    Right pyelonephritis/ Bacteremia/ urinary retention -Continues to have severe debility, lethargic, -Hemodynamically stable, blood pressure elevated -Needing assist with all ADLs  Wbc is 19,1 >>> 15.0   Blood culture positive for gram negative rods/ Enterobacteria/ Klebsiella.  -We will follow with a final culture sensitivity  -We will continue IV cefepime,  -We will continue IV fluid resuscitation    Follow with bladder scan if retention will place a foley catheter   Acute focal neurologic deficit/  -Follow MRI revealed no acute CVA, -Continue to be lethargic, but is easily arousable, following some commands Follow up brain MRI with no CVA,. Rules out acute ischemic event TIA or CVA.  Continue neuro checks per unit protocol.   Atrial fibrillation. Continue rate control and telemetry monitoring.  Continue Pradaxa  T2DM with dyslipidemia.  -Stable checking CBG before every meal at bedtime, SSI coverage -Poor p.o. intake, abstaining from long-acting insulin  Hyperlipidemia -Stable, -We will discontinue  statins as there is no benefit at this point.   HTN. -Stable, continue blood pressure monitoring.   AKI with Hyponatremia.  -Continue to improve, responding to fluid resuscitation -Likely  exacerbated by hypovolemia, dehydration, continue gentle IV fluid hydration normal saline -Improving we will continue to follow closely  Metabolic encephalopathy  -likely metabolic due to dehydration, bacteremia, UTI -Remains severely debilitated, poor p.o. intake, lethargic, multiple comorbidities including chronic blindness -We will monitor mental status closely, seem to be at baseline now, following commands  Malnutrition with protein deficiency, poor p.o. intake -Nutrition consulted -We will encourage p.o. intake -Continue IV fluids   Ethics -palliative consulted, discussed with family, pursuing DNR/DNI status Anticipating if no improvement to move to hospice care Discussed with family in detail son and grandson  - prognosis remains grim due to progressive decline mentally and physically -along with comorbidities, poor p.o. intake malnutrition, muscle wasting, deconditioning   Patient continue to be at high risk for worsening pyelonephritis.   Status is: Inpatient  Remains inpatient appropriate because:IV treatments appropriate due to intensity of illness or inability to take PO   Dispo: The patient is from: Home              Anticipated d/c is to: Home with hospice versus hospice home              Anticipated d/c date is: in AM               Patient currently is not medically stable to d/c.   DVT prophylaxis: pradaxa   Code Status:    partial  Family Communication: Son on the phone yesterday, spoke to his grandson at the bedside today updated    Antimicrobials:   Cefepime   Objective: Vitals:   06/06/20 0015 06/06/20 0436 06/06/20 0753 06/06/20 1111  BP: (!) 153/95 (!) 182/93 (!) 172/95 (!) 172/80  Pulse: 76 72 80 69  Resp: 19 17 16 16   Temp: 98.3 F (36.8 C) 97.8 F (36.6 C) (!) 97 F (36.1 C) 98.8 F (37.1 C)  TempSrc: Oral Oral    SpO2: 99% 100% 98% 94%  Weight:        Intake/Output Summary (Last 24 hours) at 06/06/2020 1250 Last data filed at 06/06/2020  08/06/2020 Gross per 24 hour  Intake 1921.87 ml  Output 1801 ml  Net 120.87 ml   Filed Weights   06/03/20 1400  Weight: 70.8 kg       Physical Exam:   General:  Alert, oriented, cooperative, no distress; lethargic, generalized cachexia, bedbound  HEENT:  Normocephalic, PERRL, otherwise with in Normal limits --baseline blindness bilateral  Neuro:   Responsive but lethargic CNII-XII intact. , normal motor and sensation, reflexes intact   Lungs:   Clear to auscultation BL, Respirations unlabored, no wheezes / crackles  Cardio:    S1/S2, RRR, No murmure, No Rubs or Gallops   Abdomen:   Soft, non-tender, bowel sounds active all four quadrants,  no guarding or peritoneal signs.  Muscular skeletal:   Severe generalized weaknesses, with cachexia Limited exam - in bed, able to move all extremities,,  2+ pulses,  symmetric, No pitting edema  Skin:  Dry, warm to touch, negative for any Rashes,   Wounds: None visible, please see nursing documentation              Data Reviewed: I have personally reviewed following labs and imaging studies  CBC: Recent Labs  Lab 06/03/20 1406 06/04/20 0140 06/05/20 0423 06/06/20 08/06/20  WBC 18.3* 19.1* 15.3* 15.0*  NEUTROABS 14.5*  --   --   --   HGB 13.9 13.0 12.0* 12.2*  HCT 40.3 36.6* 34.0* 35.2*  MCV 87.8 84.9 86.1 87.3  PLT 337 361 339 337   Basic Metabolic Panel: Recent Labs  Lab 06/03/20 1406 06/03/20 2042 06/04/20 0140 06/04/20 0627 06/04/20 0947 06/05/20 0423 06/06/20 0313  NA 126*   < > 130* 130* 132* 136 139  K 3.9   < > 3.5 3.4* 3.4* 3.2* 3.4*  CL 89*   < > 96* 98 97* 104 106  CO2 25   < > 26 25 24 23 26   GLUCOSE 199*   < > 228* 181* 206* 150* 122*  BUN 38*   < > 32* 33* 32* 26* 18  CREATININE 1.25*   < > 0.86 0.83 0.82 0.76 0.58*  CALCIUM 9.5   < > 9.2 9.2 9.2 8.5* 8.2*  MG 2.0  --   --   --   --   --   --    < > = values in this interval not displayed.   GFR: Estimated Creatinine Clearance: 63.9 mL/min (A) (by C-G  formula based on SCr of 0.58 mg/dL (L)). Liver Function Tests: Recent Labs  Lab 06/03/20 1406  AST 58*  ALT 38  ALKPHOS 97  BILITOT 1.2  PROT 6.6  ALBUMIN 2.8*   No results for input(s): LIPASE, AMYLASE in the last 168 hours. No results for input(s): AMMONIA in the last 168 hours. Coagulation Profile: Recent Labs  Lab 06/03/20 1406  INR 1.7*   Cardiac Enzymes: No results for input(s): CKTOTAL, CKMB, CKMBINDEX, TROPONINI in the last 168 hours. BNP (last 3 results) No results for input(s): PROBNP in the last 8760 hours. HbA1C: Recent Labs    06/04/20 0140  HGBA1C 7.6*   CBG: Recent Labs  Lab 06/05/20 2017 06/06/20 0017 06/06/20 0447 06/06/20 0849 06/06/20 1128  GLUCAP 186* 136* 114* 144* 160*   Lipid Profile: Recent Labs    06/04/20 0140  CHOL 63  HDL 15*  LDLCALC 33  TRIG 75  CHOLHDL 4.2   Thyroid Function Tests: No results for input(s): TSH, T4TOTAL, FREET4, T3FREE, THYROIDAB in the last 72 hours. Anemia Panel: No results for input(s): VITAMINB12, FOLATE, FERRITIN, TIBC, IRON, RETICCTPCT in the last 72 hours.    Radiology Studies: I have reviewed all of the imaging during this hospital visit personally     Scheduled Meds: .  stroke: mapping our early stages of recovery book   Does not apply Once  . acidophilus  2 capsule Oral TID  . amLODipine  5 mg Oral Daily  . aspirin  300 mg Rectal Daily  . atorvastatin  10 mg Oral Daily  . brimonidine  1 drop Both Eyes TID  . brinzolamide  1 drop Both Eyes TID  . Chlorhexidine Gluconate Cloth  6 each Topical Daily  . clotrimazole  1 application Topical BID  . dabigatran  150 mg Oral BID  . finasteride  5 mg Oral Daily  . insulin aspart  0-9 Units Subcutaneous Q4H  . latanoprost  1 drop Both Eyes QHS  . nystatin  1 application Topical BID  . potassium chloride  40 mEq Oral Once  . tamsulosin  0.4 mg Oral Daily  . timolol  1 drop Both Eyes BID   Continuous Infusions: . sodium chloride 75 mL/hr at  06/06/20 1056  . ceFEPime (MAXIPIME) IV 2 g (06/06/20 1058)  LOS: 3 days        Kendell Bane, MD

## 2020-06-06 NOTE — TOC Progression Note (Signed)
Transition of Care [facility]) - Progression Note    Patient Details  Name: RAWLIN REAUME MRN: [id number] Date of Birth: 1930/02/18  Transition of Care [facility]) CM/SW Contact  Barrie Dunker, RN Phone Number: 06/06/2020, 8:21 AM  Clinical Narrative:   Spoke with the patient's son [name], we spoke about hospice and I offered the different Hospice companies, he chose [facility], I also encouraged him to apply for Medicaid and explained that it takes a while to get approved if the patient qualifies, he stated understanding, The patient's grandson Cletis Athens will be here at the hospital at around 10 AM and would like to meet with the Hospice liason at that time.  I notified Clydie Braun of this.     Expected Discharge Plan: Home w Home Health Services Barriers to Discharge: Continued Medical Work up  Expected Discharge Plan and Services Expected Discharge Plan: Home w Home Health Services   Discharge Planning Services: CM Consult   Living arrangements for the past 2 months: Assisted Living Facility                 DME Arranged: 3-N-1, Hospital bed, Suction (air mattress) DME Agency: [employer] Date DME Agency Contacted: 06/05/20 Time DME Agency Contacted: 1053 Representative spoke with at DME Agency: Elease Hashimoto HH Arranged: PT, RN, OT, Nurse's Aide HH Agency: [facility] Date [facility] Agency Contacted: 06/05/20 Time HH Agency Contacted: 1056 Representative spoke with at [facility] Agency: [place]   Social Determinants of Health (SDOH) Interventions    Readmission Risk Interventions No flowsheet data found.

## 2020-06-06 NOTE — Consult Note (Signed)
Consultation Note Date: 06/06/2020   Patient Name: Chad Bailey  DOB: Dec 19, 1929  MRN: [id number]  Age / Sex: 86 y.o., male  PCP: Ezequiel Kayser, MD Referring Physician: Deatra James, MD  Reason for Consultation: Hospice Evaluation and Psychosocial/spiritual support  HPI/Patient Profile: 86 y.o. male  with past medical history of blindness, giant cell arteritis, gout, HLD, HTN, T2DM, a fib, and CVA admitted on 06/03/2020 with confusion and left sided weakness - diagnosed with pyelonephritis (CAUTI). Patient has continued to be profoundly weak and have poor PO intake. MRI negative for CVA. Family has expressed interest in hospice services. PMT consulted to assist.   Clinical Assessment and Goals of Care: I have reviewed medical records including EPIC notes, labs and imaging, assessed the patient and then met with grandson Wille Glaser  to discuss diagnosis prognosis, [place], EOL wishes, disposition and options.  Prior to speaking with grandson - discussed case with hospice liaisons. Hospice services have already been discussed with family and family has agreed to hospice care at home. Shanon Brow (patient's son) has given hospital permission to speak with grandson Wille Glaser for healthcare decisions. Patient is well known to me from previous hospitalization in July - at that time I spoke with patient's son Shanon Brow. Hospice was introduced and referral to outpatient palliative was made. Palliative saw patient once at rehab facility following hospital discharge.   When speaking to Wille Glaser at bedside, I introduced Palliative Medicine as specialized medical care for people living with serious illness. It focuses on providing relief from the symptoms and stress of a serious illness. The goal is to improve quality of life for both the patient and the family.  We reviewed decision for hospice services at home. All questions and concerns addressed. We reviewed code status. DNR status was  recommended d/t poor outcomes in similar hospitalized patients, as the cause of the arrest is likely associated with chronic/terminal disease rather than a reversible acute cardio-pulmonary event. Wille Glaser agrees DNR is appropriate. Wille Glaser has a medical background.   Questions and concerns were addressed. The family was encouraged to call with questions or concerns.    Primary Decision Maker NEXT OF KIN    SUMMARY OF RECOMMENDATIONS   - home with hospice - code status changed to DNR - signed DNR on chart  Code Status/Advance Care Planning:  DNR  Prognosis:   < 6 months  Discharge Planning: Home with Hospice      Primary Diagnoses: Present on Admission:  Pyelonephritis  Hyperlipidemia  HYPERTENSION, BENIGN  Peripheral vascular disease in diabetes mellitus ([place])  CVA (cerebral vascular accident) ([place])  Vision loss, bilateral   I have reviewed the medical record, interviewed the patient and family, and examined the patient. The following aspects are pertinent.  Past Medical History:  Diagnosis Date   Blindness    Dysuria    Fracture of left ankle    Giant cell arteritis (HCC)    gouty arthritis    History of tobacco abuse    Hyperlipidemia    Hypertension    Loss of vision 01/2013   left eye   Non-insulin dependent diabetes mellitus    PAF (paroxysmal atrial fibrillation) (HCC)    a. on pradaxa; b. CHADS2VASc at least 7 (  HTN, age x 2, DM, stroke, vascular disease)   Retinal artery thrombosis, left 2014   Thyroid nodule 2014   Type 2 diabetes mellitus ([place])    Social History   Socioeconomic History   Marital status: Married    Spouse name: Not on file   Number of children: Not on file   Years of education: Not on file   Highest education level: Not on file  Occupational History   Not on file  Tobacco Use   Smoking status: Former Smoker    Packs/day: 6.00    Years: 56.00    Pack years: 336.00    Types: Cigarettes, Pipe    Quit date:  10/05/2002    Years since quitting: 17.6   Smokeless tobacco: Former Systems developer    Types: Chew   Tobacco comment: smoked 6 pipes/day;smoked cigarettes 1/2 PPD ,quit chewing tobacco in 2004  Substance and Sexual Activity   Alcohol use: No   Drug use: No   Sexual activity: Not on file  Other Topics Concern   Not on file  Social History Narrative   Not on file   Social Determinants of Health   Financial Resource Strain:    Difficulty of Paying Living Expenses: Not on file  Food Insecurity:    Worried About Charity fundraiser in the Last Year: Not on file   [employer] of Food in the Last Year: Not on file  Transportation Needs:    Lack of Transportation (Medical): Not on file   Lack of Transportation (Non-Medical): Not on file  Physical Activity:    Days of Exercise per Week: Not on file   Minutes of Exercise per Session: Not on file  Stress:    Feeling of Stress : Not on file  Social Connections:    Frequency of Communication with Friends and Family: Not on file   Frequency of Social Gatherings with Friends and Family: Not on file   Attends Religious Services: Not on file   Active Member of Clubs or Organizations: Not on file   Attends Archivist Meetings: Not on file   Marital Status: Not on file   Family History  Problem Relation Age of Onset   Heart disease Mother    Tuberculosis Mother    Lung cancer Father    Cancer Maternal Grandmother        bowel   Kidney disease Neg Hx    Prostate cancer Neg Hx    Scheduled Meds:   stroke: mapping our early stages of recovery book   Does not apply Once   acidophilus  2 capsule Oral TID   amLODipine  5 mg Oral Daily   aspirin  300 mg Rectal Daily   atorvastatin  10 mg Oral Daily   brimonidine  1 drop Both Eyes TID   brinzolamide  1 drop Both Eyes TID   Chlorhexidine Gluconate Cloth  6 each Topical Daily   clotrimazole  1 application Topical BID   dabigatran  150 mg Oral BID    finasteride  5 mg Oral Daily   insulin aspart  0-9 Units Subcutaneous Q4H   latanoprost  1 drop Both Eyes QHS   nystatin  1 application Topical BID   potassium chloride  40 mEq Oral Once   tamsulosin  0.4 mg Oral Daily   timolol  1 drop Both Eyes BID   Continuous Infusions:  sodium chloride 75 mL/hr at 06/06/20 1056   ceFEPime (MAXIPIME) IV 2 g (06/06/20  1058)   PRN Meds:.acetaminophen **OR** [DISCONTINUED] acetaminophen (TYLENOL) oral liquid 160 mg/5 mL **OR** [DISCONTINUED] acetaminophen, hydrALAZINE No Known Allergies  Resting in bed, appears comfortable, does not open eyes to voice, breathing unlabored  Vital Signs: BP (!) 172/80 (BP Location: Left Arm)    Pulse 69    Temp 98.8 F (37.1 C)    Resp 16    Wt 70.8 kg    SpO2 94%    BMI 21.16 kg/m  Pain Scale: 0-10   Pain Score: 1    SpO2: SpO2: 94 % O2 Device:SpO2: 94 % O2 Flow Rate: .   IO: Intake/output summary:   Intake/Output Summary (Last 24 hours) at 06/06/2020 1255 Last data filed at 06/06/2020 0955 Gross per 24 hour  Intake 1921.87 ml  Output 1801 ml  Net 120.87 ml    LBM: Last BM Date: 06/06/20 Baseline Weight: Weight: 70.8 kg Most recent weight: Weight: 70.8 kg     Palliative Assessment/Data: PPS 20%    Time Total: 30 minutes Greater than 50%  of this time was spent counseling and coordinating care related to the above assessment and plan.  Juel Burrow, DNP, AGNP-C Palliative Medicine Team [phone] Pager: [phone]

## 2020-06-07 LAB — CBC
HCT: 36.2 % — ABNORMAL LOW (ref 39.0–52.0)
Hemoglobin: 12.2 g/dL — ABNORMAL LOW (ref 13.0–17.0)
MCH: 30.1 pg (ref 26.0–34.0)
MCHC: 33.7 g/dL (ref 30.0–36.0)
MCV: 89.4 fL (ref 80.0–100.0)
Platelets: 390 10*3/uL (ref 150–400)
RBC: 4.05 MIL/uL — ABNORMAL LOW (ref 4.22–5.81)
RDW: 13.6 % (ref 11.5–15.5)
WBC: 13.9 10*3/uL — ABNORMAL HIGH (ref 4.0–10.5)
nRBC: 0 % (ref 0.0–0.2)

## 2020-06-07 LAB — GLUCOSE, CAPILLARY
Glucose-Capillary: 111 mg/dL — ABNORMAL HIGH (ref 70–99)
Glucose-Capillary: 153 mg/dL — ABNORMAL HIGH (ref 70–99)
Glucose-Capillary: 174 mg/dL — ABNORMAL HIGH (ref 70–99)
Glucose-Capillary: 198 mg/dL — ABNORMAL HIGH (ref 70–99)

## 2020-06-07 LAB — BASIC METABOLIC PANEL
Anion gap: 7 (ref 5–15)
BUN: 13 mg/dL (ref 8–23)
CO2: 27 mmol/L (ref 22–32)
Calcium: 8.3 mg/dL — ABNORMAL LOW (ref 8.9–10.3)
Chloride: 100 mmol/L (ref 98–111)
Creatinine, Ser: 0.59 mg/dL — ABNORMAL LOW (ref 0.61–1.24)
GFR calc Af Amer: >60 mL/min (ref >60)
GFR calc non Af Amer: >60 mL/min (ref >60)
Glucose, Bld: 133 mg/dL — ABNORMAL HIGH (ref 70–99)
Potassium: 3.1 mmol/L — ABNORMAL LOW (ref 3.5–5.1)
Sodium: 134 mmol/L — ABNORMAL LOW (ref 135–145)

## 2020-06-07 MED ORDER — CIPROFLOXACIN HCL 500 MG PO TABS: 500.0000 mg | ORAL_TABLET | Freq: Two times a day (BID) | ORAL | 0 refills | Status: AC

## 2020-06-07 NOTE — TOC Progression Note (Signed)
Transition of Care [facility] - Progression Note    Patient Details  Name: SLADE PIERPOINT MRN: [id number] Date of Birth: 1929/11/24  Transition of Care [facility]) CM/SW Contact  Barrie Dunker, RN Phone Number: 06/07/2020, 1:10 PM  Clinical Narrative:  Called [facility] to arrange medical transport to home [address], they will be here at 2 PM to pick up the patient, I called [name] his son to make him aware and he sytated that he is there waiting on him, I notified the bedside nurse     Expected Discharge Plan: Home w Home Health Services Barriers to Discharge: Continued Medical Work up  Expected Discharge Plan and Services Expected Discharge Plan: Home w Home Health Services   Discharge Planning Services: CM Consult   Living arrangements for the past 2 months: Assisted Living Facility Expected Discharge Date: 06/07/20               DME Arranged: 3-N-1, Hospital bed, Suction (air mattress) DME Agency: [employer] Date DME Agency Contacted: 06/05/20 Time DME Agency Contacted: 1053 Representative spoke with at DME Agency: Elease Hashimoto HH Arranged: PT, RN, OT, Nurse's Aide HH Agency: [facility] Date [facility] Agency Contacted: 06/05/20 Time HH Agency Contacted: 1056 Representative spoke with at [facility] Agency: [place]   Social Determinants of Health (SDOH) Interventions    Readmission Risk Interventions No flowsheet data found.

## 2020-06-07 NOTE — Progress Notes (Signed)
[facility] Civil engineer, contracting [facility]) Hospital Liaison RN note:  Spoke with grandson, Cletis [place] and son, [name] via phone to confirm that all needed equipment had been delivered. Family requests a geomat for bed and RN discussed that admission RN will order all extra needed equipment and supplies. Son, [name] stated that patient has arrived and is doing well. No other questions or concerns. Discharge summary sent to referral.  Cyndra Numbers, RN [facility] Liaison [phone]

## 2020-06-07 NOTE — Plan of Care (Signed)
  Problem: Education: Goal: Knowledge of General Education information will improve Description: Including pain rating scale, medication(s)/side effects and non-pharmacologic comfort measures Outcome: Adequate for Discharge   Problem: Health Behavior/Discharge Planning: Goal: Ability to manage health-related needs will improve Outcome: Adequate for Discharge   Problem: Clinical Measurements: Goal: Ability to maintain clinical measurements within normal limits will improve Outcome: Adequate for Discharge Goal: Will remain free from infection Outcome: Adequate for Discharge Goal: Diagnostic test results will improve Outcome: Adequate for Discharge Goal: Respiratory complications will improve Outcome: Adequate for Discharge Goal: Cardiovascular complication will be avoided Outcome: Adequate for Discharge   Problem: Activity: Goal: Risk for activity intolerance will decrease Outcome: Adequate for Discharge   Problem: Nutrition: Goal: Adequate nutrition will be maintained Outcome: Adequate for Discharge   Problem: Coping: Goal: Level of anxiety will decrease Outcome: Adequate for Discharge   Problem: Elimination: Goal: Will not experience complications related to bowel motility Outcome: Adequate for Discharge Goal: Will not experience complications related to urinary retention Outcome: Adequate for Discharge   Problem: Pain Managment: Goal: General experience of comfort will improve Outcome: Adequate for Discharge   Problem: Safety: Goal: Ability to remain free from injury will improve Outcome: Adequate for Discharge Pt is discharged today via ems to home with hospice

## 2020-06-07 NOTE — Discharge Summary (Signed)
Physician Discharge Summary [facility]    Patient: Chad Bailey                   Admit date: 06/03/2020   DOB: 01/07/1930             Discharge date:06/07/2020/9:18 AM MVH:[id number]                          PCP: Mickey Farber, MD  Disposition: Home with hospice  Recommendations for Outpatient Follow-up:   . Follow up: in 1 day with hospice at home  Discharge Condition: Stable   Code Status:   Code Status: DNR  Diet recommendation: Regular healthy diet   Discharge Diagnoses:    Principal Problem:   Pyelonephritis Active Problems:   Hyperlipidemia   HYPERTENSION, BENIGN   Peripheral vascular disease in diabetes mellitus (HCC)   CVA (cerebral vascular accident) (HCC)   Vision loss, bilateral   Goals of care, counseling/discussion   Adult failure to thrive   DNR (do not resuscitate)   History of Present Illness/ Hospital Course Charline Bills Summary:   Brief Narrative:  Patient admitted to the hospital with a working diagnosis of pyelonephritis, catheter associated tract infection, present on admission.  Chad Bailey is a 86 year old male with PMH/o  blindness, giant cell arteritis, gout, dyslipidemia, hypertension, type II diabetes mellitus, paroxysmal atrial fibrillation, history of CVA, abdominal thrombosis and BPH.   Patient was brought to the hospital due to confusion left-sided weakness.  Recently hospitalized for urine tract infection, discharger to SNF on May 03, 2020, and discharged home on May 31, 2020.   Foley catheter was removed 8/26.  At home patient had a rapid decline in his overall mental status, poor oral intake and generalized weakness.  He was seen as an outpatient, suspected CVA due to left-sided weakness, he was referred to neurology.  He was planned him to get an MRI of his brain but instead he was sent to the emergency department.    ED course of stay/work-up On arrival hemodynamically stable.  He was neurologically  nonfocal. Sodium 126, potassium 3.9, chloride 89, bicarb 25, glucose 199, BUN 38, creatinine 1.25, magnesium 2.0, AST 58, LT 38, white count 18.3, hemoglobin 13.9, hematocrit 40.3, platelets 337.   SARS COVID-19 negative.  Urinalysis specific gravity 1.011, 0-5 red cells, more than 50 white cells.   Head CT with generalized atrophy.  No acute changes.  Chronic right frontal lobe infarct.   CT of the abdomen with findings concerning for acute right pyelonephritis with mild ureterectasis.  2.4 cm right adrenal mass.  Chest radiograph with right atelectasis, prominent pulmonary artery on the left.  No infiltrates. EKG 83 bpm, left axis deviation, normal QRS and QT, atrial fibrillation rhythm, no ST segment or T wave changes, low voltage.     Discussed yesterday extensively with patient's son POA over the phone regarding patient's poor prognosis due to comorbidities and current active medical problems. Agreed to be discharged home with hospice.     Right pyelonephritis/ Bacteremia/ urinary retention -Continues to have severe debility, lethargic, -Hemodynamically stable, blood pressure elevated -Needing assist with all ADLs  Wbc is 19,1 >>> 15.0 >>>13.9  Blood culture positive for gram negative rods/ Enterobacteria/ Klebsiella.  -We will follow with a final culture sensitivity>>> both sensitive to quinolones, will be switched to p.o. ciprofloxacin  -We will continue IV cefepime,  -Status post IV fluid resuscitation   Follow with bladder  scan if retention will place a foley catheter   Acute focal neurologic deficit/  -Follow MRI revealed no acute CVA, -Continue to be lethargic, but is easily arousable, following some commands Follow up brain MRI with no CVA,. Rules out acute ischemic event TIA or CVA.     Atrial fibrillation. Continue rate control and telemetry monitoring.  Continue Pradaxa  T2DM with dyslipidemia.  -Stable checking CBG before every meal at bedtime,  SSI coverage -Poor p.o. intake, abstaining from long-acting insulin  Hyperlipidemia -Stable,    HTN. -Stable, continue blood pressure monitoring.   AKI with Hyponatremia.  -Continue to improve, responding to fluid resuscitation -Likely exacerbated by hypovolemia, dehydration, continue gentle IV fluid hydration normal saline   Metabolic encephalopathy  -likely metabolic due to dehydration, bacteremia, UTI -Remains severely debilitated, poor p.o. intake, lethargic, multiple comorbidities including chronic blindness seem to be at baseline now, following commands  Malnutrition with protein deficiency, poor p.o. intake -Nutrition consulted -We will encourage p.o. intake    Ethics -palliative consulted, discussed with family, pursuing DNR/DNI status Anticipating if no improvement to move to hospice care... Family is agreed to be discharged home with hospice care Discussed with family in detail son and grandson  - prognosis remains grim due to progressive decline mentally and physically -along with comorbidities, poor p.o. intake malnutrition, muscle wasting, deconditioning   Dispo: The patient is from: Home with Hospice      DVT prophylaxis:      pradaxa   Code Status:                DNR /  DNI Family Communication: Son on the phone yesterday, spoke to his grandson at the bedside today updated    Antimicrobials:   Cefepime   Switched to p.o. ciprofloxacin   Nutritional status:          Discharge Instructions:   Discharge Instructions    Activity as tolerated - No restrictions   Complete by: As directed    Diet general   Complete by: As directed    Discharge instructions   Complete by: As directed    Please follow-up with hospice care at home ASAP   Increase activity slowly   Complete by: As directed        Medication List    STOP taking these medications   Calcium Carbonate-Vitamin D3 600-400 MG-UNIT Tabs   D 1000 25 MCG  (1000 UT) capsule Generic drug: Cholecalciferol   ferrous sulfate 325 (65 FE) MG tablet   vitamin C 500 MG tablet Commonly known as: ASCORBIC ACID     TAKE these medications   acetaminophen 500 MG tablet Commonly known as: TYLENOL Take 1,000 mg by mouth every 6 (six) hours as needed for pain.   aspirin EC 81 MG tablet Take 81 mg by mouth daily. Swallow whole.   atorvastatin 10 MG tablet Commonly known as: LIPITOR Take 10 mg by mouth daily.   ciprofloxacin 500 MG tablet Commonly known as: Cipro Take 1 tablet (500 mg total) by mouth 2 (two) times daily for 10 days.   clotrimazole 1 % cream Commonly known as: LOTRIMIN Apply 1 application topically 2 (two) times daily.   finasteride 5 MG tablet Commonly known as: PROSCAR TAKE ONE TABLET BY MOUTH ONCE DAILY   HumaLOG 100 UNIT/ML injection Generic drug: insulin lispro Inject 14-16 Units into the skin 3 (three) times daily with meals.   insulin glargine 100 UNIT/ML injection Commonly known as: LANTUS Inject 0.1 mLs (10 Units  total) into the skin at bedtime. Please taper off insulin Lantus by 2 units if you are tapering off prednisone, following your blood glucose levels,  Thank you What changed:   how much to take  additional instructions   Microlet Lancets Misc USE ONE LANCET TWICE DAILY  Dx code:  E11.9   multivitamin capsule Take 1 capsule by mouth daily.   nystatin powder Generic drug: nystatin Apply 1 application topically 2 (two) times daily.   Pradaxa 150 MG Caps capsule Generic drug: dabigatran TAKE ONE CAPSULE BY MOUTH EVERY 12 HOURS What changed: See the new instructions.   senna 8.6 MG tablet Commonly known as: SENOKOT Take 2 tablets by mouth daily as needed for constipation.   Simbrinza 1-0.2 % Susp Generic drug: Brinzolamide-Brimonidine Place 1 drop into both eyes 2 (two) times daily.   tamsulosin 0.4 MG Caps capsule Commonly known as: FLOMAX TAKE ONE CAPSULE BY MOUTH ONCE DAILY   timolol  0.5 % ophthalmic solution Commonly known as: BETIMOL Place 1 drop into both eyes 2 (two) times daily.   Travoprost (BAK Free) 0.004 % Soln ophthalmic solution Commonly known as: TRAVATAN Place 1 drop into both eyes every evening.       No Known Allergies   Procedures /Studies:   DG Chest 1 View  Result Date: 06/03/2020 CLINICAL DATA:  Altered mental status and weakness. EXAM: CHEST  1 VIEW COMPARISON:  04/29/2020 and prior radiographs FINDINGS: This is a low volume film with the patient leaning and rotated to the RIGHT. Mild RIGHT basilar atelectasis noted. The cardiomediastinal silhouette is unchanged. No airspace disease, pleural effusion, pneumothorax or acute bony abnormality. IMPRESSION: Mild RIGHT basilar atelectasis. Electronically Signed   By: Harmon Pier M.D.   On: 06/03/2020 14:23   CT Head Wo Contrast  Result Date: 06/03/2020 CLINICAL DATA:  Left hand and left leg numbness with headache. EXAM: CT HEAD WITHOUT CONTRAST TECHNIQUE: Contiguous axial images were obtained from the base of the skull through the vertex without intravenous contrast. COMPARISON:  April 29, 2020 FINDINGS: Brain: There is mild cerebral atrophy with widening of the extra-axial spaces and ventricular dilatation. There are areas of decreased attenuation within the white matter tracts of the supratentorial brain, consistent with microvascular disease changes. A small area of cortical encephalomalacia, with adjacent chronic white matter low attenuation, is again seen within the right frontal lobe. Vascular: No hyperdense vessel or unexpected calcification. Skull: Normal. Negative for fracture or focal lesion. Sinuses/Orbits: No acute finding. Other: None. IMPRESSION: 1. Generalized cerebral atrophy. 2. Chronic right frontal lobe infarct. 3. No acute intracranial abnormality. Electronically Signed   By: Aram Candela M.D.   On: 06/03/2020 15:10   MR BRAIN WO CONTRAST  Result Date: 06/03/2020 CLINICAL DATA:   Encephalopathy EXAM: MRI HEAD WITHOUT CONTRAST TECHNIQUE: Multiplanar, multiecho pulse sequences of the brain and surrounding structures were obtained without intravenous contrast. COMPARISON:  05/01/2019 FINDINGS: Brain: No acute infarct, acute hemorrhage or extra-axial collection. Early confluent hyperintense T2-weighted signal of the periventricular and deep white matter, most commonly due to chronic ischemic microangiopathy. There is an old right frontal infarct. There is generalized atrophy without lobar predilection. No chronic microhemorrhage. Normal midline structures. Vascular: Normal flow voids. Skull and upper cervical spine: Normal marrow signal. Sinuses/Orbits: Right lens replacement Other: None IMPRESSION: 1. No acute intracranial abnormality. 2. Generalized atrophy and findings of chronic ischemic microangiopathy. Electronically Signed   By: Deatra Robinson M.D.   On: 06/03/2020 20:32   CT ABDOMEN PELVIS W CONTRAST  Result Date: 06/03/2020 CLINICAL DATA:  Progressive weakness, left side EXAM: CT ABDOMEN AND PELVIS WITH CONTRAST TECHNIQUE: Multidetector CT imaging of the abdomen and pelvis was performed using the standard protocol following bolus administration of intravenous contrast. CONTRAST:  OMNIPAQUE IOHEXOL 300 MG/ML  SOLN COMPARISON:  Retroperitoneal ultrasound 11/11/2015 FINDINGS: Lower chest: Dependent atelectasis. Additional bandlike areas of opacity, likely subsegmental atelectasis or scarring. Lung bases otherwise clear. Normal cardiac size. No pericardial effusion. Three-vessel coronary artery calcifications. Extensive calcifications of the aortic leaflets. Calcifications of the thoracic aorta noted as well. Hepatobiliary: No worrisome focal liver abnormality is seen. Normal gallbladder. No visible calcified gallstones. No biliary ductal dilatation. Pancreas: Moderate pancreatic atrophy with extensive pancreatic parenchymal calcifications. No ductal dilatation or acute  peripancreatic inflammation. Spleen: Few punctate calcification in the spleen, likely calcified granulomata. No concerning splenic lesions. Splenic size within normal limits. Adrenals/Urinary Tract: Mildly heterogeneous 2.4 cm mass in the right adrenal gland within average Hounsfield units 92. No other concerning adrenal masses. No concerning renal masses. Mildly delayed and striated right nephrogram with geographic regions of hypoattenuation in the kidney which is accentuated on delayed phase imaging. Some mildly asymmetric right perinephric stranding noted as well. Mild bilateral ureterectasis without obstructive urolith. Mild circumferential bladder wall thickening with faint perivesicular haze. Stomach/Bowel: Distal esophagus, stomach and duodenal sweep are unremarkable. No small bowel wall thickening or dilatation. No evidence of obstruction. A normal appendix is visualized. No colonic dilatation or wall thickening. Scattered colonic diverticula without focal inflammation to suggest diverticulitis. Vascular/Lymphatic: Atherosclerotic calcifications within the abdominal aorta and branch vessels. No aneurysm or ectasia. No enlarged abdominopelvic lymph nodes. Reproductive: Indentation of the bladder base by the heterogeneous, enlarged prostate, could result in outlet obstruction. Heterogeneity appears largely confined to the median lobe which is a more typically benign finding though could correlate with prostate exam and PSA as clinically warranted. Other: No abdominopelvic free fluid or free gas. No bowel containing hernias. Musculoskeletal: Dextrocurvature of the lumbar spine, apex L3. Multilevel degenerative changes are present in the imaged portions of the spine. Additional degenerative changes in the hips and pelvis. Additional degenerative changes in the hands and wrists partially included within the level of imaging. No acute or suspicious osseous abnormalities. IMPRESSION: 1. Mildly delayed and striated  right nephrogram with geographic regions of hypoattenuation in the kidney which is accentuated on delayed phase imaging. Findings are concerning for acute pyelonephritis with mild ureterectasis and indentation of the bladder base by enlarged prostate which could suggest some underlying chronic outlet obstruction as well. 2. Mild heterogeneity of the prostate gland appears largely confined to the median lobe which is a more typically benign finding though could correlate with prostate exam and PSA as clinically warranted. 3. Heterogeneous, intermediate attenuation 2.4 cm right adrenal mass, indeterminate. Consider chemical shift MRI or multiphase adrenal CT for further evaluation. This recommendation follows ACR consensus guidelines: Management of Incidental Adrenal Masses: A White Paper of the ACR Incidental Findings Committee. J Am Coll Radiol 2017;14:1038-1044. 4. Aortic Atherosclerosis (ICD10-I70.0). Electronically Signed   By: Kreg Shropshire M.D.   On: 06/03/2020 16:04   ECHOCARDIOGRAM COMPLETE  Result Date: 06/04/2020    ECHOCARDIOGRAM REPORT   Patient Name:   ANDRES VEST Date of Exam: 06/04/2020 Medical Rec #:  [id number]        Height:       72.0 in Accession #:    [id number]       Weight:       156.0 lb Date of  Birth:  01/29/1930         BSA:          1.917 m Patient Age:    89 years         BP:           163/97 mmHg Patient Gender: M                HR:           90 bpm. Exam Location:  [facility] Procedure: 2D Echo, Color Doppler and Cardiac Doppler Indications:     G45.9 TIA  History:         Patient has prior history of Echocardiogram examinations. Risk                  Factors:Hypertension, Diabetes, Current Smoker and                  Dyslipidemia.  Sonographer:     Humphrey RollsJoan Heiss RDCS (AE) Referring Phys:  [id number] Zachery ConchPRANAV M PATEL Diagnosing Phys: Julien Nordmannimothy Gollan MD  Sonographer Comments: Technically challenging study due to limited acoustic windows, no parasternal window, suboptimal apical window and no  subcostal window. IMPRESSIONS  1. Left ventricular ejection fraction, by estimation, is 60 to 65%. The left ventricle has normal function. The left ventricle has no regional wall motion abnormalities. Left ventricular diastolic parameters are indeterminate.  2. Right ventricular systolic function is normal. The right ventricular size is normal.  3. The aortic valve was not well visualized. Aortic valve regurgitation is not visualized. moderate aortic valve sclerosis/calcification is present, Estimated valve gradient indicating no significant aortic stenosis. On Select images, there appears to be a degree of stenosis greater than what is measured.  4. There is borderline dilatation of the aortic root measuring 37 mm. FINDINGS  Left Ventricle: Left ventricular ejection fraction, by estimation, is 60 to 65%. The left ventricle has normal function. The left ventricle has no regional wall motion abnormalities. The left ventricular internal cavity size was normal in size. There is  no left ventricular hypertrophy. Left ventricular diastolic parameters are indeterminate. Right Ventricle: The right ventricular size is normal. No increase in right ventricular wall thickness. Right ventricular systolic function is normal. Left Atrium: Left atrial size was normal in size. Right Atrium: Right atrial size was normal in size. Pericardium: There is no evidence of pericardial effusion. Mitral Valve: The mitral valve is normal in structure. Normal mobility of the mitral valve leaflets. No evidence of mitral valve regurgitation. No evidence of mitral valve stenosis. MV peak gradient, 6.5 mmHg. The mean mitral valve gradient is 2.0 mmHg. Tricuspid Valve: The tricuspid valve is normal in structure. Tricuspid valve regurgitation is not demonstrated. No evidence of tricuspid stenosis. Aortic Valve: The aortic valve was not well visualized. Aortic valve regurgitation is not visualized. Mild to moderate aortic valve sclerosis/calcification  is present, without any evidence of aortic stenosis. Aortic valve mean gradient measures 2.0 mmHg.  Aortic valve peak gradient measures 2.8 mmHg. Aortic valve area, by VTI measures 1.89 cm. Pulmonic Valve: The pulmonic valve was normal in structure. Pulmonic valve regurgitation is not visualized. No evidence of pulmonic stenosis. Aorta: The aortic root is normal in size and structure. There is borderline dilatation of the aortic root measuring 37 mm. Venous: The inferior vena cava is normal in size with greater than 50% respiratory variability, suggesting right atrial pressure of 3 mmHg. IAS/Shunts: No atrial level shunt detected by color flow Doppler.  LEFT VENTRICLE PLAX 2D  LVOT diam:     2.10 cm     Diastology LV SV:         28          LV e' lateral:   14.10 cm/s LV SV Index:   15          LV E/e' lateral: 8.2 LVOT Area:     3.46 cm    LV e' medial:    11.50 cm/s                            LV E/e' medial:  10.0  LV Volumes (MOD) LV vol d, MOD A4C: 48.0 ml LV vol s, MOD A4C: 20.9 ml LV SV MOD A4C:     48.0 ml RIGHT VENTRICLE RV Basal diam:  2.74 cm LEFT ATRIUM           Index LA Vol (A4C): 27.5 ml 14.34 ml/m  AORTIC VALVE AV Area (Vmax):    2.36 cm AV Area (Vmean):   2.12 cm AV Area (VTI):     1.89 cm AV Vmax:           83.60 cm/s AV Vmean:          57.900 cm/s AV VTI:            0.151 m AV Peak Grad:      2.8 mmHg AV Mean Grad:      2.0 mmHg LVOT Vmax:         56.90 cm/s LVOT Vmean:        35.400 cm/s LVOT VTI:          0.082 m LVOT/AV VTI ratio: 0.54  AORTA Ao Root diam: 3.70 cm MITRAL VALVE MV Area (PHT): 3.49 cm     SHUNTS MV Peak grad:  6.5 mmHg     Systemic VTI:  0.08 m MV Mean grad:  2.0 mmHg     Systemic Diam: 2.10 cm MV Vmax:       1.27 m/s MV Vmean:      69.5 cm/s MV Decel Time: 217 msec MV E velocity: 115.00 cm/s Julien Nordmann MD Electronically signed by Julien Nordmann MD Signature Date/Time: 06/04/2020/12:00:50 PM    Final      Subjective:   Patient was seen and examined 06/07/2020, 9:18  AM Patient stable today. No acute distress.  No issues overnight Stable for discharge.  Discharge Exam:    Vitals:   06/06/20 1537 06/06/20 1944 06/07/20 0015 06/07/20 0823  BP: (!) 161/92 (!) 143/62 (!) 149/84 (!) 182/81  Pulse: 80 61 79 79  Resp: 16 16 16 16   Temp: 98.6 F (37 C) 98.8 F (37.1 C) 98.5 F (36.9 C) 98 F (36.7 C)  TempSrc: Oral Oral Oral Oral  SpO2: 95% 97% 94% 95%  Weight:        General: Pt lying comfortably in bed & appears in no obvious distress. Cardiovascular: S1 & S2 heard, RRR, S1/S2 +. No murmurs, rubs, gallops or clicks. No JVD or pedal edema. Respiratory: Clear to auscultation without wheezing, rhonchi or crackles. No increased work of breathing. Abdominal:  Non-distended, non-tender & soft. No organomegaly or masses appreciated. Normal bowel sounds heard. CNS: Alert and oriented. No focal deficits. Extremities: no edema, no cyanosis    The results of significant diagnostics from this hospitalization (including imaging, microbiology, ancillary and laboratory) are listed below for reference.      Microbiology:   Recent  Results (from the past 240 hour(s))  SARS Coronavirus 2 by RT PCR (hospital order, performed in [facility] hospital lab) Nasopharyngeal Nasopharyngeal Swab     Status: None   Collection Time: 06/03/20  2:07 PM   Specimen: Nasopharyngeal Swab  Result Value Ref Range Status   SARS Coronavirus 2 NEGATIVE NEGATIVE Final    Comment: (NOTE) SARS-CoV-2 target nucleic acids are NOT DETECTED.  The SARS-CoV-2 RNA is generally detectable in upper and lower respiratory specimens during the acute phase of infection. The lowest concentration of SARS-CoV-2 viral copies this assay can detect is 250 copies / mL. A negative result does not preclude SARS-CoV-2 infection and should not be used as the sole basis for treatment or other patient management decisions.  A negative result may occur with improper specimen collection / handling,  submission of specimen other than nasopharyngeal swab, presence of viral mutation(s) within the areas targeted by this assay, and inadequate number of viral copies (<250 copies / mL). A negative result must be combined with clinical observations, patient history, and epidemiological information.  Fact Sheet for Patients:   BoilerBrush.com.cy  Fact Sheet for Healthcare Providers: https://pope.com/  This test is not yet approved or  cleared by the [place] FDA and has been authorized for detection and/or diagnosis of SARS-CoV-2 by FDA under an Emergency Use Authorization (EUA).  This EUA will remain in effect (meaning this test can be used) for the duration of the COVID-19 declaration under Section 564(b)(1) of the Act, 21 U.S.C. section 360bbb-3(b)(1), unless the authorization is terminated or revoked sooner.  Performed at [facility], [address] [place], [place] [address]   Urine Culture     Status: Abnormal   Collection Time: 06/03/20  2:18 PM   Specimen: Urine, Random  Result Value Ref Range Status   Specimen Description   Final    URINE, RANDOM Performed at [facility], [address] [place], [place] [address]    Special Requests   Final    NONE Performed at [facility], [address] [place], [place] [address]    Culture >=100,000 COLONIES/mL ENTEROBACTER AEROGENES (A)  Final   Report Status 06/05/2020 FINAL  Final   Organism ID, Bacteria ENTEROBACTER AEROGENES (A)  Final      Susceptibility   Enterobacter aerogenes - MIC*    CEFAZOLIN >=64 RESISTANT Resistant     CEFTRIAXONE <=0.25 SENSITIVE Sensitive     CIPROFLOXACIN <=0.25 SENSITIVE Sensitive     GENTAMICIN <=1 SENSITIVE Sensitive     IMIPENEM 2 SENSITIVE Sensitive     NITROFURANTOIN 64 INTERMEDIATE Intermediate     TRIMETH/SULFA <=20 SENSITIVE Sensitive     PIP/TAZO <=4 SENSITIVE Sensitive     * >=100,000 COLONIES/mL  ENTEROBACTER AEROGENES  Blood culture (routine x 2)     Status: Abnormal   Collection Time: 06/03/20  3:52 PM   Specimen: BLOOD LEFT WRIST  Result Value Ref Range Status   Specimen Description   Final    BLOOD LEFT WRIST Performed at [facility], [address], [place], [place] [address]    Special Requests   Final    BOTTLES DRAWN AEROBIC AND ANAEROBIC Blood Culture results may not be optimal due to an inadequate volume of blood received in culture bottles Performed at [facility], [address] [place], [place] [address]    Culture  Setup Time   Final    Organism ID to follow GRAM NEGATIVE RODS IN BOTH AEROBIC  AND ANAEROBIC BOTTLES CRITICAL RESULT CALLED TO, READ BACK BY AND VERIFIED WITH: SCOTT HALL AT [phone] ON 06/04/2020 [facility]. Performed at [facility], [address] [place], [place] [address]    Culture ENTEROBACTER AEROGENES (A)  Final   Report Status 06/06/2020 FINAL  Final   Organism ID, Bacteria ENTEROBACTER AEROGENES  Final      Susceptibility   Enterobacter aerogenes - MIC*    CEFAZOLIN >=64 RESISTANT Resistant     CEFEPIME <=0.12 SENSITIVE Sensitive     CEFTAZIDIME <=1 SENSITIVE Sensitive     CEFTRIAXONE <=0.25 SENSITIVE Sensitive     CIPROFLOXACIN <=0.25 SENSITIVE Sensitive     GENTAMICIN <=1 SENSITIVE Sensitive     IMIPENEM 1 SENSITIVE Sensitive     TRIMETH/SULFA <=20 SENSITIVE Sensitive     PIP/TAZO <=4 SENSITIVE Sensitive     * ENTEROBACTER AEROGENES  Blood culture (routine x 2)     Status: Abnormal   Collection Time: 06/03/20  3:52 PM   Specimen: BLOOD RIGHT ARM  Result Value Ref Range Status   Specimen Description   Final    BLOOD RIGHT ARM Performed at [facility], [address], [place], [place] [address]    Special Requests   Final    BOTTLES DRAWN AEROBIC AND ANAEROBIC Blood Culture results may not be optimal due to an inadequate volume of blood received in culture bottles Performed at [facility],  [address] [place], [place] [address]    Culture  Setup Time   Final    GRAM NEGATIVE RODS IN BOTH AEROBIC AND ANAEROBIC BOTTLES CRITICAL VALUE NOTED.  VALUE IS CONSISTENT WITH PREVIOUSLY REPORTED AND CALLED VALUE. Performed at [facility], [address] [place], [place] [address]    Culture (A)  Final    ENTEROBACTER AEROGENES SUSCEPTIBILITIES PERFORMED ON PREVIOUS CULTURE WITHIN THE LAST 5 DAYS. Performed at [facility], [address], [place], [place] [address]    Report Status 06/06/2020 FINAL  Final  Blood Culture ID Panel (Reflexed)     Status: Abnormal   Collection Time: 06/03/20  3:52 PM  Result Value Ref Range Status   Enterococcus faecalis NOT DETECTED NOT DETECTED Final   Enterococcus Faecium NOT DETECTED NOT DETECTED Final   Listeria monocytogenes NOT DETECTED NOT DETECTED Final   Staphylococcus species NOT DETECTED NOT DETECTED Final   Staphylococcus aureus (BCID) NOT DETECTED NOT DETECTED Final   Staphylococcus epidermidis NOT DETECTED NOT DETECTED Final   Staphylococcus lugdunensis NOT DETECTED NOT DETECTED Final   Streptococcus species NOT DETECTED NOT DETECTED Final   Streptococcus agalactiae NOT DETECTED NOT DETECTED Final   Streptococcus pneumoniae NOT DETECTED NOT DETECTED Final   Streptococcus pyogenes NOT DETECTED NOT DETECTED Final   A.calcoaceticus-baumannii NOT DETECTED NOT DETECTED Final   Bacteroides fragilis NOT DETECTED NOT DETECTED Final   Enterobacterales DETECTED (A) NOT DETECTED Final    Comment: Enterobacterales represent a large order of gram negative bacteria, not a single organism. CRITICAL RESULT CALLED TO, READ BACK BY AND VERIFIED WITH: SCOTT HALL AT 1660 ON 06/04/2020 [facility].    Enterobacter cloacae complex NOT DETECTED NOT DETECTED Final   Escherichia coli NOT DETECTED NOT DETECTED Final   Klebsiella aerogenes DETECTED (A) NOT DETECTED Final    Comment: CRITICAL RESULT CALLED TO, READ BACK BY AND VERIFIED  WITH: SCOTT HALL AT 6301 ON 06/04/2020 [facility].    Klebsiella oxytoca NOT DETECTED NOT DETECTED Final   Klebsiella pneumoniae NOT DETECTED NOT DETECTED Final  Proteus species NOT DETECTED NOT DETECTED Final   Salmonella species NOT DETECTED NOT DETECTED Final   Serratia marcescens NOT DETECTED NOT DETECTED Final   Haemophilus influenzae NOT DETECTED NOT DETECTED Final   Neisseria meningitidis NOT DETECTED NOT DETECTED Final   Pseudomonas aeruginosa NOT DETECTED NOT DETECTED Final   Stenotrophomonas maltophilia NOT DETECTED NOT DETECTED Final   Candida albicans NOT DETECTED NOT DETECTED Final   Candida auris NOT DETECTED NOT DETECTED Final   Candida glabrata NOT DETECTED NOT DETECTED Final   Candida krusei NOT DETECTED NOT DETECTED Final   Candida parapsilosis NOT DETECTED NOT DETECTED Final   Candida tropicalis NOT DETECTED NOT DETECTED Final   Cryptococcus neoformans/gattii NOT DETECTED NOT DETECTED Final   CTX-M ESBL NOT DETECTED NOT DETECTED Final   Carbapenem resistance IMP NOT DETECTED NOT DETECTED Final   Carbapenem resistance KPC NOT DETECTED NOT DETECTED Final   Carbapenem resistance NDM NOT DETECTED NOT DETECTED Final   Carbapenem resist OXA 48 LIKE NOT DETECTED NOT DETECTED Final   Carbapenem resistance VIM NOT DETECTED NOT DETECTED Final    Comment: Performed at [facility], [address] [place], [place] [address]     Labs:   CBC: Recent Labs  Lab 06/03/20 1406 06/04/20 0140 06/05/20 0423 06/06/20 0313 06/07/20 0353  WBC 18.3* 19.1* 15.3* 15.0* 13.9*  NEUTROABS 14.5*  --   --   --   --   HGB 13.9 13.0 12.0* 12.2* 12.2*  HCT 40.3 36.6* 34.0* 35.2* 36.2*  MCV 87.8 84.9 86.1 87.3 89.4  PLT 337 361 339 337 390   Basic Metabolic Panel: Recent Labs  Lab 06/03/20 1406 06/03/20 2042 06/04/20 0627 06/04/20 0947 06/05/20 0423 06/06/20 0313 06/07/20 0353  NA 126*   < > 130* 132* 136 139 134*  K 3.9   < > 3.4* 3.4* 3.2* 3.4* 3.1*  CL 89*   < > 98 97*  104 106 100  CO2 25   < > 25 24 23 26 27   GLUCOSE 199*   < > 181* 206* 150* 122* 133*  BUN 38*   < > 33* 32* 26* 18 13  CREATININE 1.25*   < > 0.83 0.82 0.76 0.58* 0.59*  CALCIUM 9.5   < > 9.2 9.2 8.5* 8.2* 8.3*  MG 2.0  --   --   --   --   --   --    < > = values in this interval not displayed.   Liver Function Tests: Recent Labs  Lab 06/03/20 1406  AST 58*  ALT 38  ALKPHOS 97  BILITOT 1.2  PROT 6.6  ALBUMIN 2.8*   BNP (last 3 results) No results for input(s): BNP in the last 8760 hours. Cardiac Enzymes: No results for input(s): CKTOTAL, CKMB, CKMBINDEX, TROPONINI in the last 168 hours. CBG: Recent Labs  Lab 06/06/20 1646 06/06/20 1946 06/07/20 0014 06/07/20 0518 06/07/20 0824  GLUCAP 185* 205* 198* 111* 153*   Hgb A1c No results for input(s): HGBA1C in the last 72 hours. Lipid Profile No results for input(s): CHOL, HDL, LDLCALC, TRIG, CHOLHDL, LDLDIRECT in the last 72 hours. Thyroid function studies No results for input(s): TSH, T4TOTAL, T3FREE, THYROIDAB in the last 72 hours.  Invalid input(s): FREET3 Anemia work up No results for input(s): VITAMINB12, FOLATE, FERRITIN, TIBC, IRON, RETICCTPCT in the last 72 hours. Urinalysis    Component Value Date/Time   COLORURINE YELLOW (A) 06/03/2020 1418   APPEARANCEUR CLOUDY (A) 06/03/2020 1418   APPEARANCEUR  Clear 11/01/2015 1504   LABSPEC 1.011 06/03/2020 1418   PHURINE 5.0 06/03/2020 1418   GLUCOSEU NEGATIVE 06/03/2020 1418   GLUCOSEU NEGATIVE 05/23/2014 1343   HGBUR SMALL (A) 06/03/2020 1418   BILIRUBINUR NEGATIVE 06/03/2020 1418   BILIRUBINUR Negative 11/01/2015 1504   KETONESUR NEGATIVE 06/03/2020 1418   PROTEINUR 30 (A) 06/03/2020 1418   UROBILINOGEN 0.2 05/23/2014 1343   NITRITE NEGATIVE 06/03/2020 1418   LEUKOCYTESUR MODERATE (A) 06/03/2020 1418         Time coordinating discharge: Over 45 minutes  SIGNED: Kendell Bane, MD, FACP, FHM. [facility],  Please use [email or web address] to Page If  7PM-7AM, please contact night-coverage [email or web address] 06/07/2020, 9:18 AM

## 2020-06-07 NOTE — TOC Progression Note (Signed)
Transition of Care [facility]) - Progression Note    Patient Details  Name: Chad Bailey MRN: [id number] Date of Birth: [birth year]  [facility]) CM/SW Contact  Barrie Dunker, RN Phone Number: 06/07/2020, 10:25 AM  Clinical Narrative:   Spoke to the son [name], Confirmed the home address and that someone will be there to accept the patient when transported home with EMS, He confirmed the home address and asked that I call him when I have a time and he will be there, the patient's wife is always there as well, I explained that a gel overlay will be provided by [employer] for the hospital bed but explained that the air mattress would not be approved because to get approved has to have a stage 3 or 4 decubitus.He stated understanding   Expected Discharge Plan: Home w Home Health Services Barriers to Discharge: Continued Medical Work up  Expected Discharge Plan and Services Expected Discharge Plan: Home w Home Health Services   Discharge Planning Services: CM Consult   Living arrangements for the past 2 months: Assisted Living Facility Expected Discharge Date: 06/07/20               DME Arranged: 3-N-1, Hospital bed, Suction (air mattress) DME Agency: [employer] Date DME Agency Contacted: 06/05/20 Time DME Agency Contacted: [phone] Representative spoke with at DME Agency: Elease Hashimoto HH Arranged: PT, RN, OT, Nurse's Aide HH Agency: [facility] Date [facility] Agency Contacted: 06/05/20 Time HH Agency Contacted: 1056 Representative spoke with at [facility] Agency: [place]   Social Determinants of Health (SDOH) Interventions    Readmission Risk Interventions No flowsheet data found.

## 2020-09-04 DEATH — deceased
